# Patient Record
Sex: Male | Born: 1935 | Race: White | Hispanic: No | Marital: Married | Smoking: Never smoker
Health system: Southern US, Community
[De-identification: ages and names within clinical notes are randomized; demographics above are authoritative.]

## PROBLEM LIST (undated history)

## (undated) DIAGNOSIS — Z860100 Personal history of colon polyps, unspecified: Secondary | ICD-10-CM

## (undated) DIAGNOSIS — I10 Essential (primary) hypertension: Secondary | ICD-10-CM

## (undated) DIAGNOSIS — R0982 Postnasal drip: Secondary | ICD-10-CM

## (undated) DIAGNOSIS — C9310 Chronic myelomonocytic leukemia not having achieved remission: Secondary | ICD-10-CM

## (undated) DIAGNOSIS — D72821 Monocytosis (symptomatic): Secondary | ICD-10-CM

## (undated) DIAGNOSIS — E785 Hyperlipidemia, unspecified: Secondary | ICD-10-CM

## (undated) DIAGNOSIS — E7800 Pure hypercholesterolemia, unspecified: Secondary | ICD-10-CM

## (undated) DIAGNOSIS — C9000 Multiple myeloma not having achieved remission: Secondary | ICD-10-CM

## (undated) HISTORY — PX: EAR BIOPSY: SHX1480

## (undated) HISTORY — PX: HERNIA REPAIR: SHX51

## (undated) HISTORY — DX: Chronic myelomonocytic leukemia not having achieved remission: C93.10

## (undated) HISTORY — DX: Postnasal drip: R09.82

## (undated) HISTORY — DX: Personal history of colonic polyps: Z86.010

## (undated) HISTORY — PX: NOSE SURGERY: SHX723

## (undated) HISTORY — DX: Personal history of colon polyps, unspecified: Z86.0100

## (undated) HISTORY — DX: Multiple myeloma not having achieved remission: C90.00

## (undated) HISTORY — DX: Pure hypercholesterolemia, unspecified: E78.00

## (undated) HISTORY — DX: Hyperlipidemia, unspecified: E78.5

## (undated) HISTORY — DX: Essential (primary) hypertension: I10

## (undated) HISTORY — PX: TONSILLECTOMY: SUR1361

## (undated) HISTORY — DX: Monocytosis (symptomatic): D72.821

---

## 2011-03-09 DIAGNOSIS — H269 Unspecified cataract: Secondary | ICD-10-CM | POA: Insufficient documentation

## 2012-02-19 ENCOUNTER — Emergency Department: Payer: Self-pay | Admitting: Emergency Medicine

## 2012-02-19 LAB — CBC
HCT: 44.2 % (ref 40.0–52.0)
HGB: 14.9 g/dL (ref 13.0–18.0)
MCH: 31.5 pg (ref 26.0–34.0)
Platelet: 135 10*3/uL — ABNORMAL LOW (ref 150–440)

## 2012-02-19 LAB — BASIC METABOLIC PANEL
BUN: 22 mg/dL — ABNORMAL HIGH (ref 7–18)
Calcium, Total: 8.5 mg/dL (ref 8.5–10.1)
Chloride: 108 mmol/L — ABNORMAL HIGH (ref 98–107)
Creatinine: 1.07 mg/dL (ref 0.60–1.30)
Osmolality: 292 (ref 275–301)
Sodium: 145 mmol/L (ref 136–145)

## 2012-02-19 IMAGING — CT CT CHEST W/ CM
1 of 2 series · 14 of 32 positions shown, 18 images · non-contrast
Comparison: none

REASON FOR EXAM: cough and hypoxia
COMMENTS:

[Series 5: lung windows · axial · 0.73mm/px · z∈[-409,-139]mm · 14 of 108 slices shown, 18 images]
[im 9/108  mediastinal]
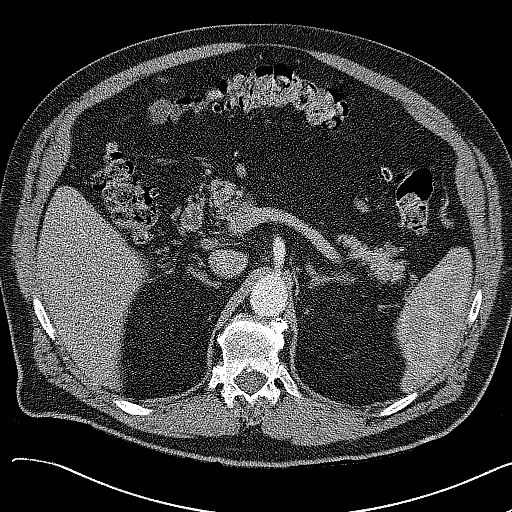
[im 9/108  lung]
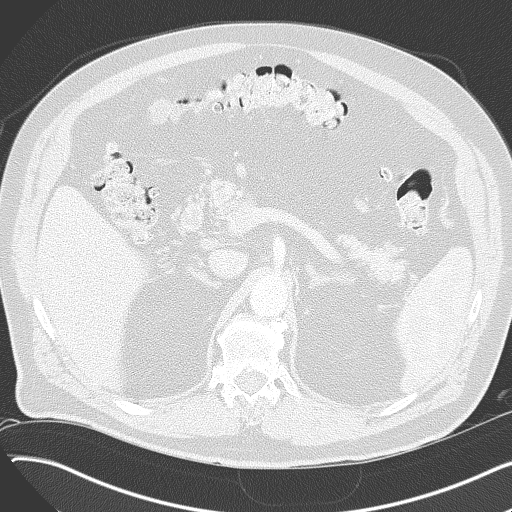
[im 17/108  lung]
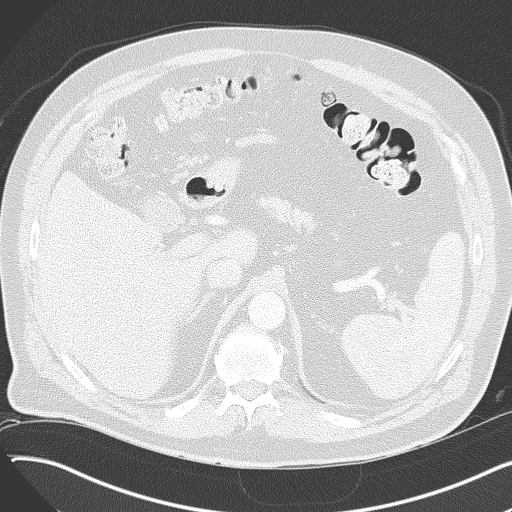
[im 25/108  lung]
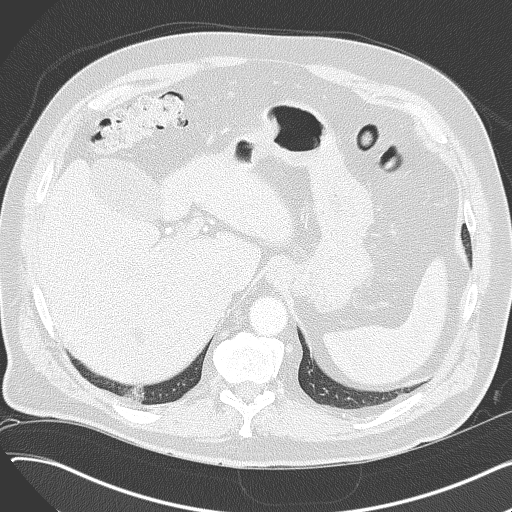
[im 33/108  lung]
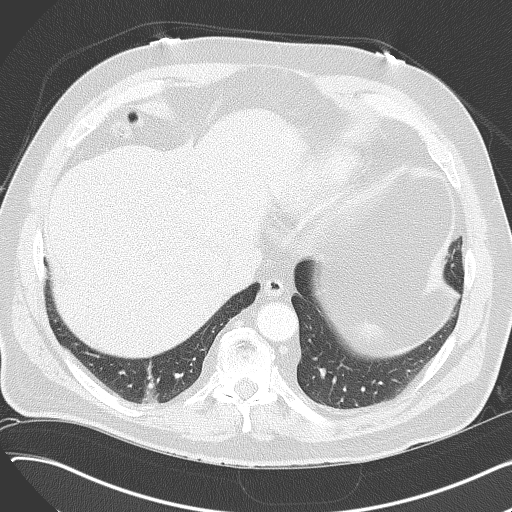
[im 42/108  mediastinal]
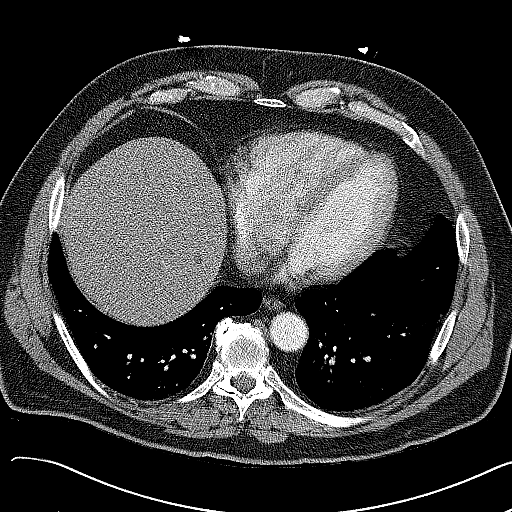
[im 42/108  lung]
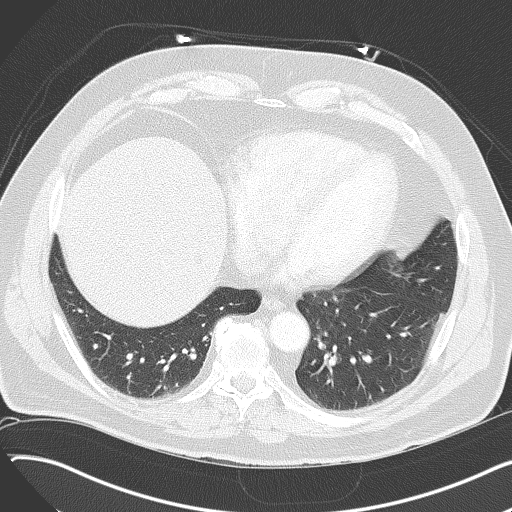
[im 50/108  lung]
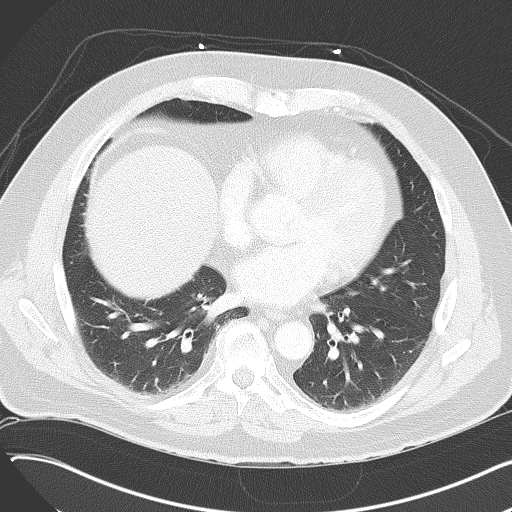
[im 51/108  lung]
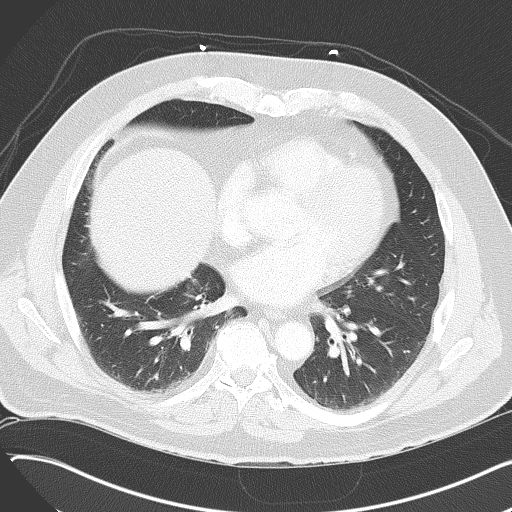
[im 54/108  lung]
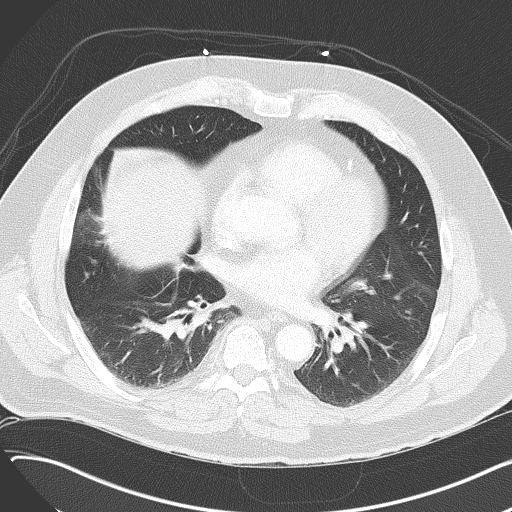
[im 58/108  mediastinal]
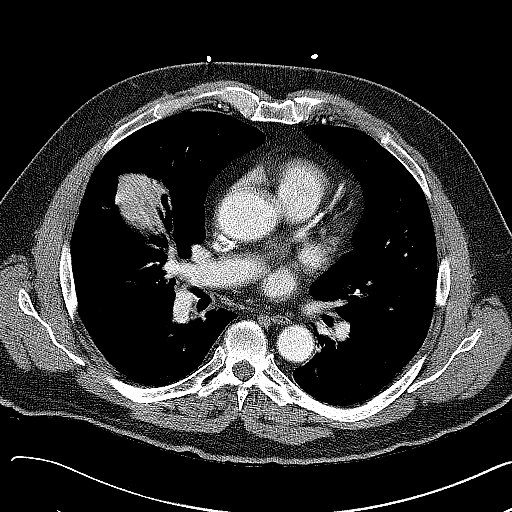
[im 58/108  lung]
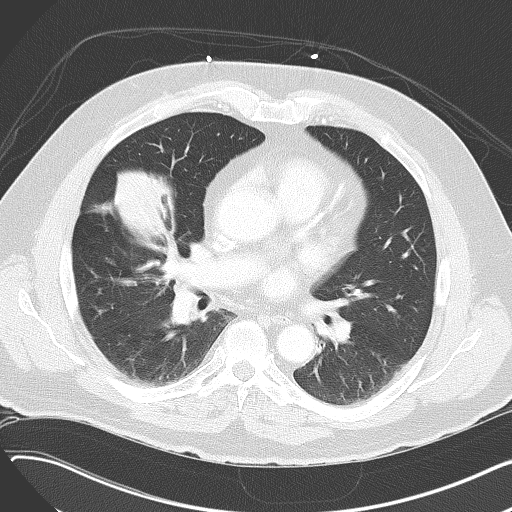
[im 66/108  lung]
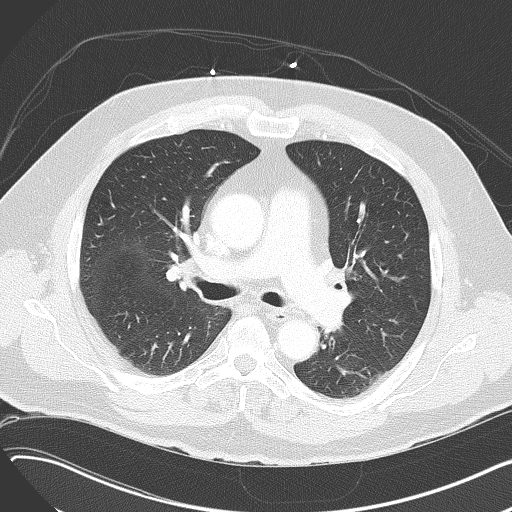
[im 75/108  lung]
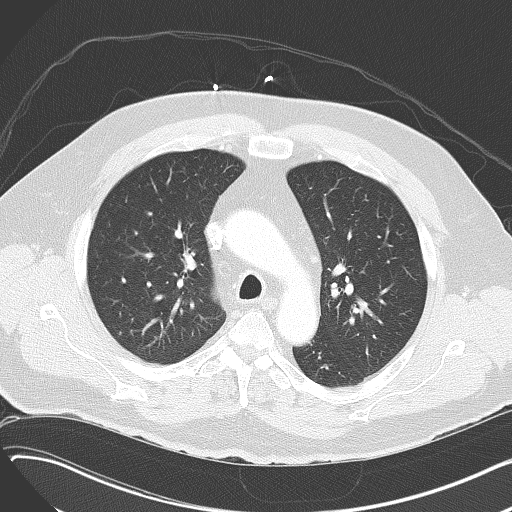
[im 83/108  lung]
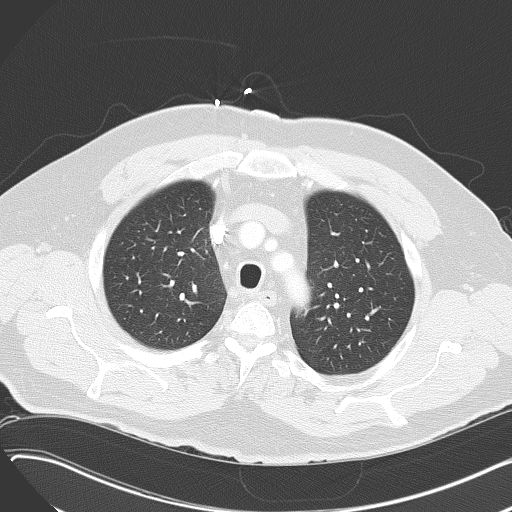
[im 91/108  mediastinal]
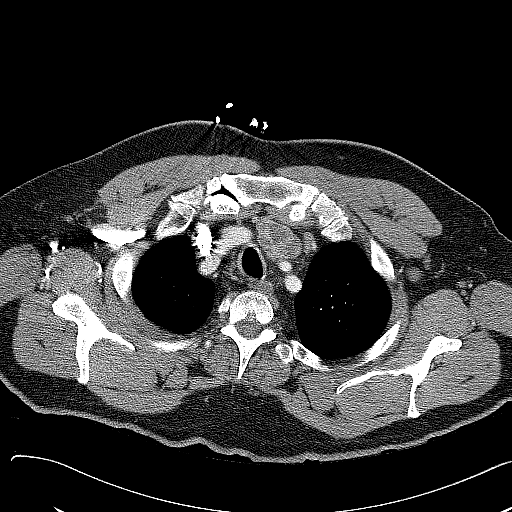
[im 91/108  lung]
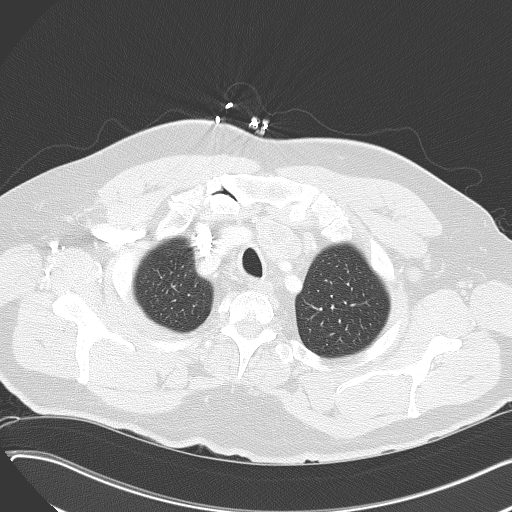
[im 99/108  lung]
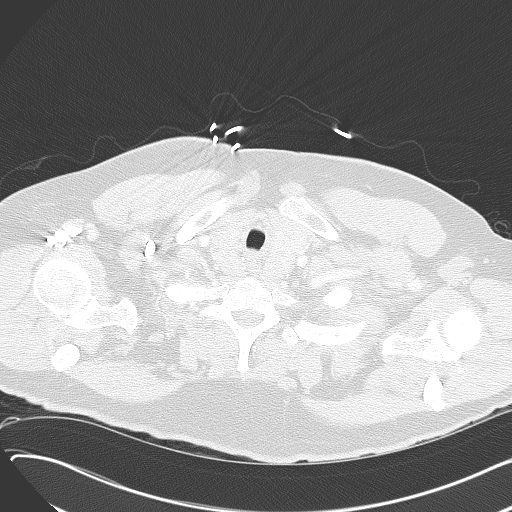

[14 of 32 positions shown; findings below may reference images not displayed]

PROCEDURE:     CT  - CT CHEST (FOR PE) W  - [DATE]  [DATE]

RESULT:     Axial CT scanning was performed through the chest with
reconstructions at 3 mm intervals and slice thicknesses following
intravenous administration of 75 cc of [0K]. Review of multiplanar
reconstructed images was performed separately on the VIA monitor.

The cardiac chambers are normal in size. The caliber of the thoracic aorta
is normal. Contrast within the pulmonary arterial tree is normal. There are
no findings to suggest an acute pulmonary embolism. There is enlargement of
the thyroid lobes with the left lobe being larger than the right. The
thyroid is multinodular in appearance. There is no pathologic mediastinal or
hilar lymphadenopathy.

At lung window settings there is basilar atelectasis on the right just above
the dome of the mildly elevated hemidiaphragm. I do not see evidence of
classic alveolar infiltrates. No suspicious pulmonary parenchymal masses are
demonstrated. There is no pleural effusion.

Within the upper abdomen there are hypodensities in the liver which measure
1 cm in diameter or less most compatible with cysts. The largest seen on
image 83 in the right lobe has Hounsfield measurement of +14. The smaller in
the left aspect of the dome has Hounsfield measurement of +3. I see no
intrahepatic ductal dilation. The gallbladder exhibits no evidence of
calcified stones. The spleen is not enlarged. There are no adrenal masses.
The stomach is nondistended.

The thoracic vertebral bodies are preserved in height.
IMPRESSION: 1. There is mild elevation of the right hemidiaphragm. There is basilar
atelectasis on the right adjacent to the dome of the hemidiaphragm.
2. I do not see abnormality of the lung parenchyma elsewhere.
3. There enlargement of the thyroid gland consistent with a goiter.
4. I see no evidence of acute pulmonary embolism nor acute thoracic aortic
pathology. There is no evidence of CHF.
5. I see no mediastinal nor hilar lymphadenopathy.

## 2012-02-19 IMAGING — CR DG CHEST 2V
1 series · 2 of 2 positions shown · non-contrast
Comparison: none

REASON FOR EXAM: cough and fever
COMMENTS:

[Series 1: w chest pa · 0.14mm/px · 2 of 2 slices shown]
[im 1/2]
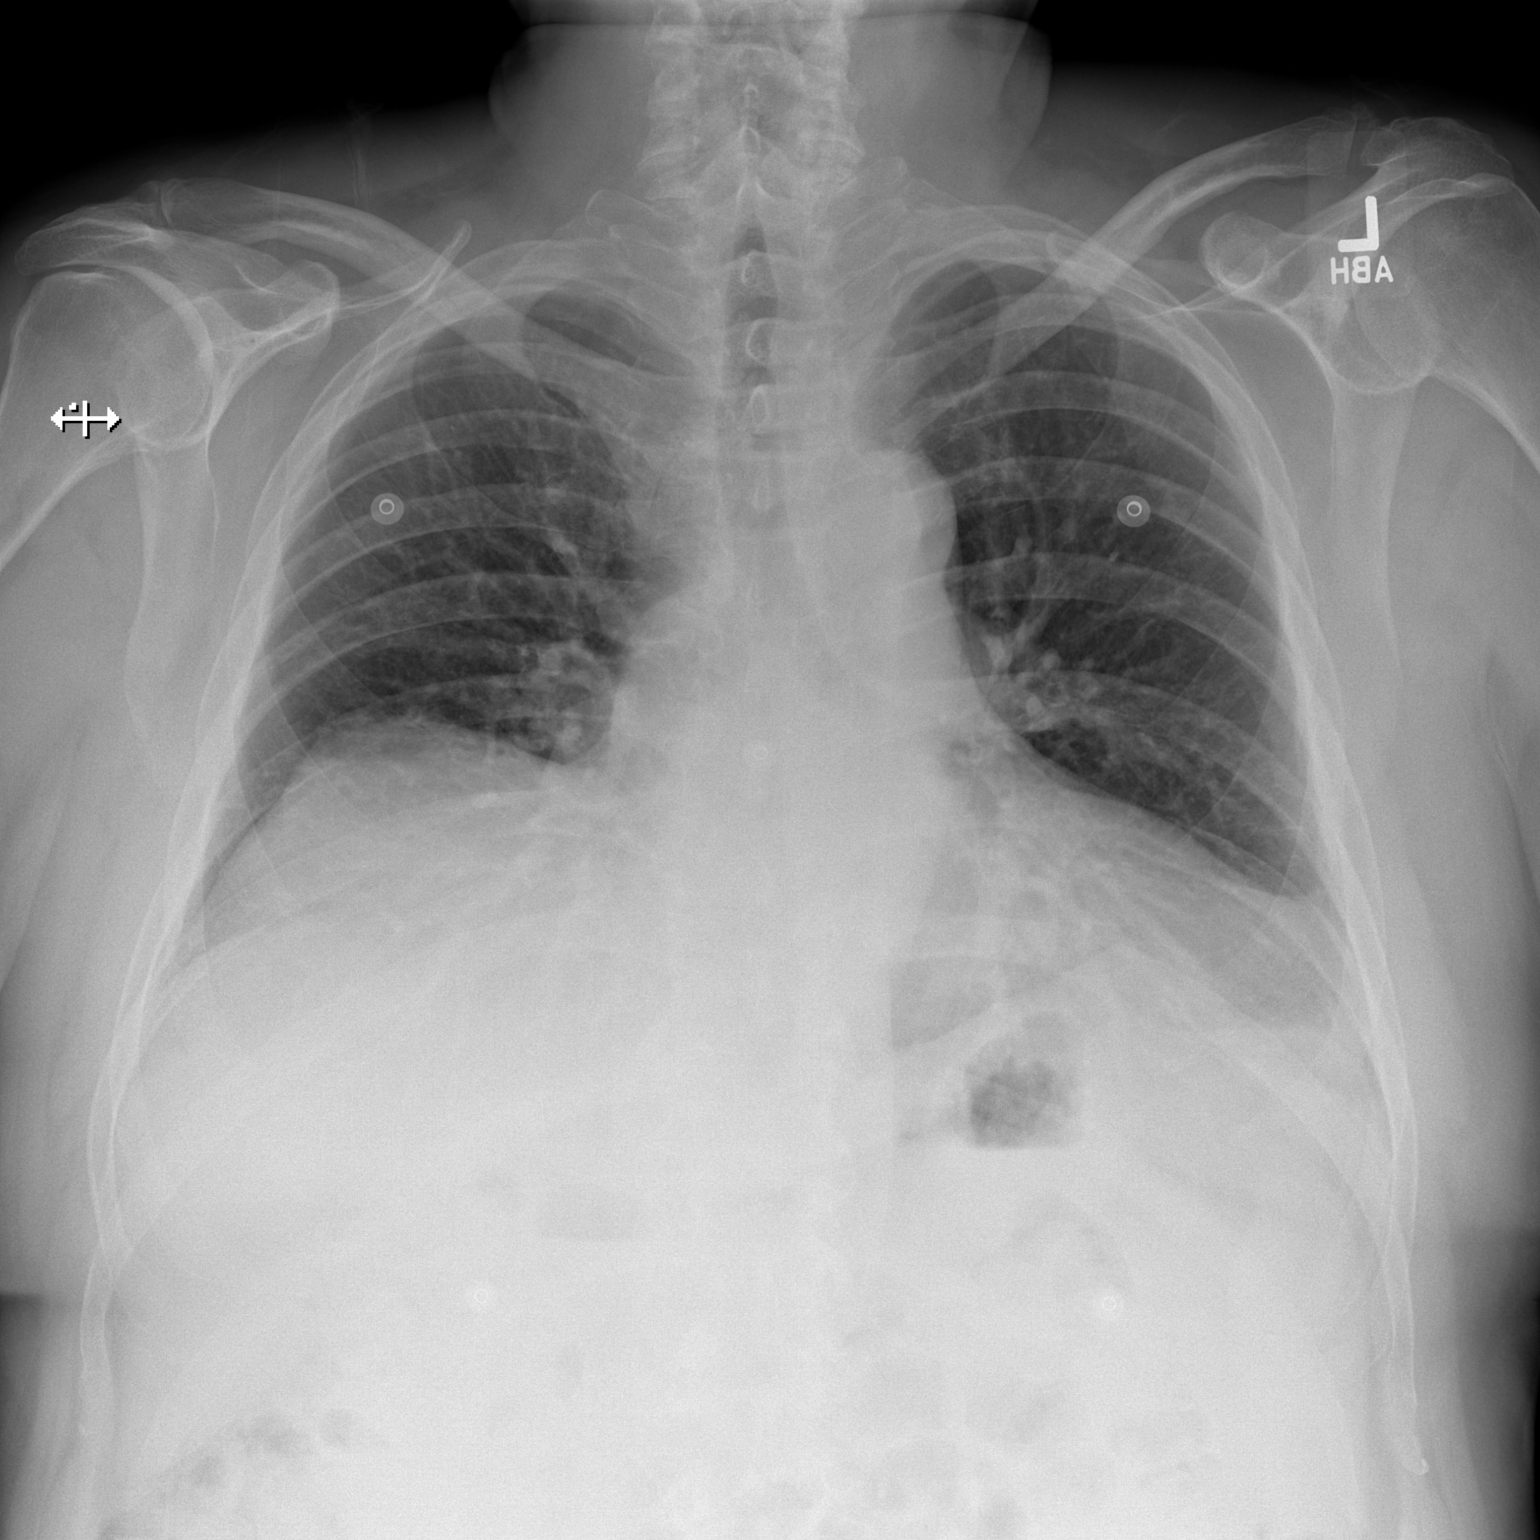
[im 2/2]
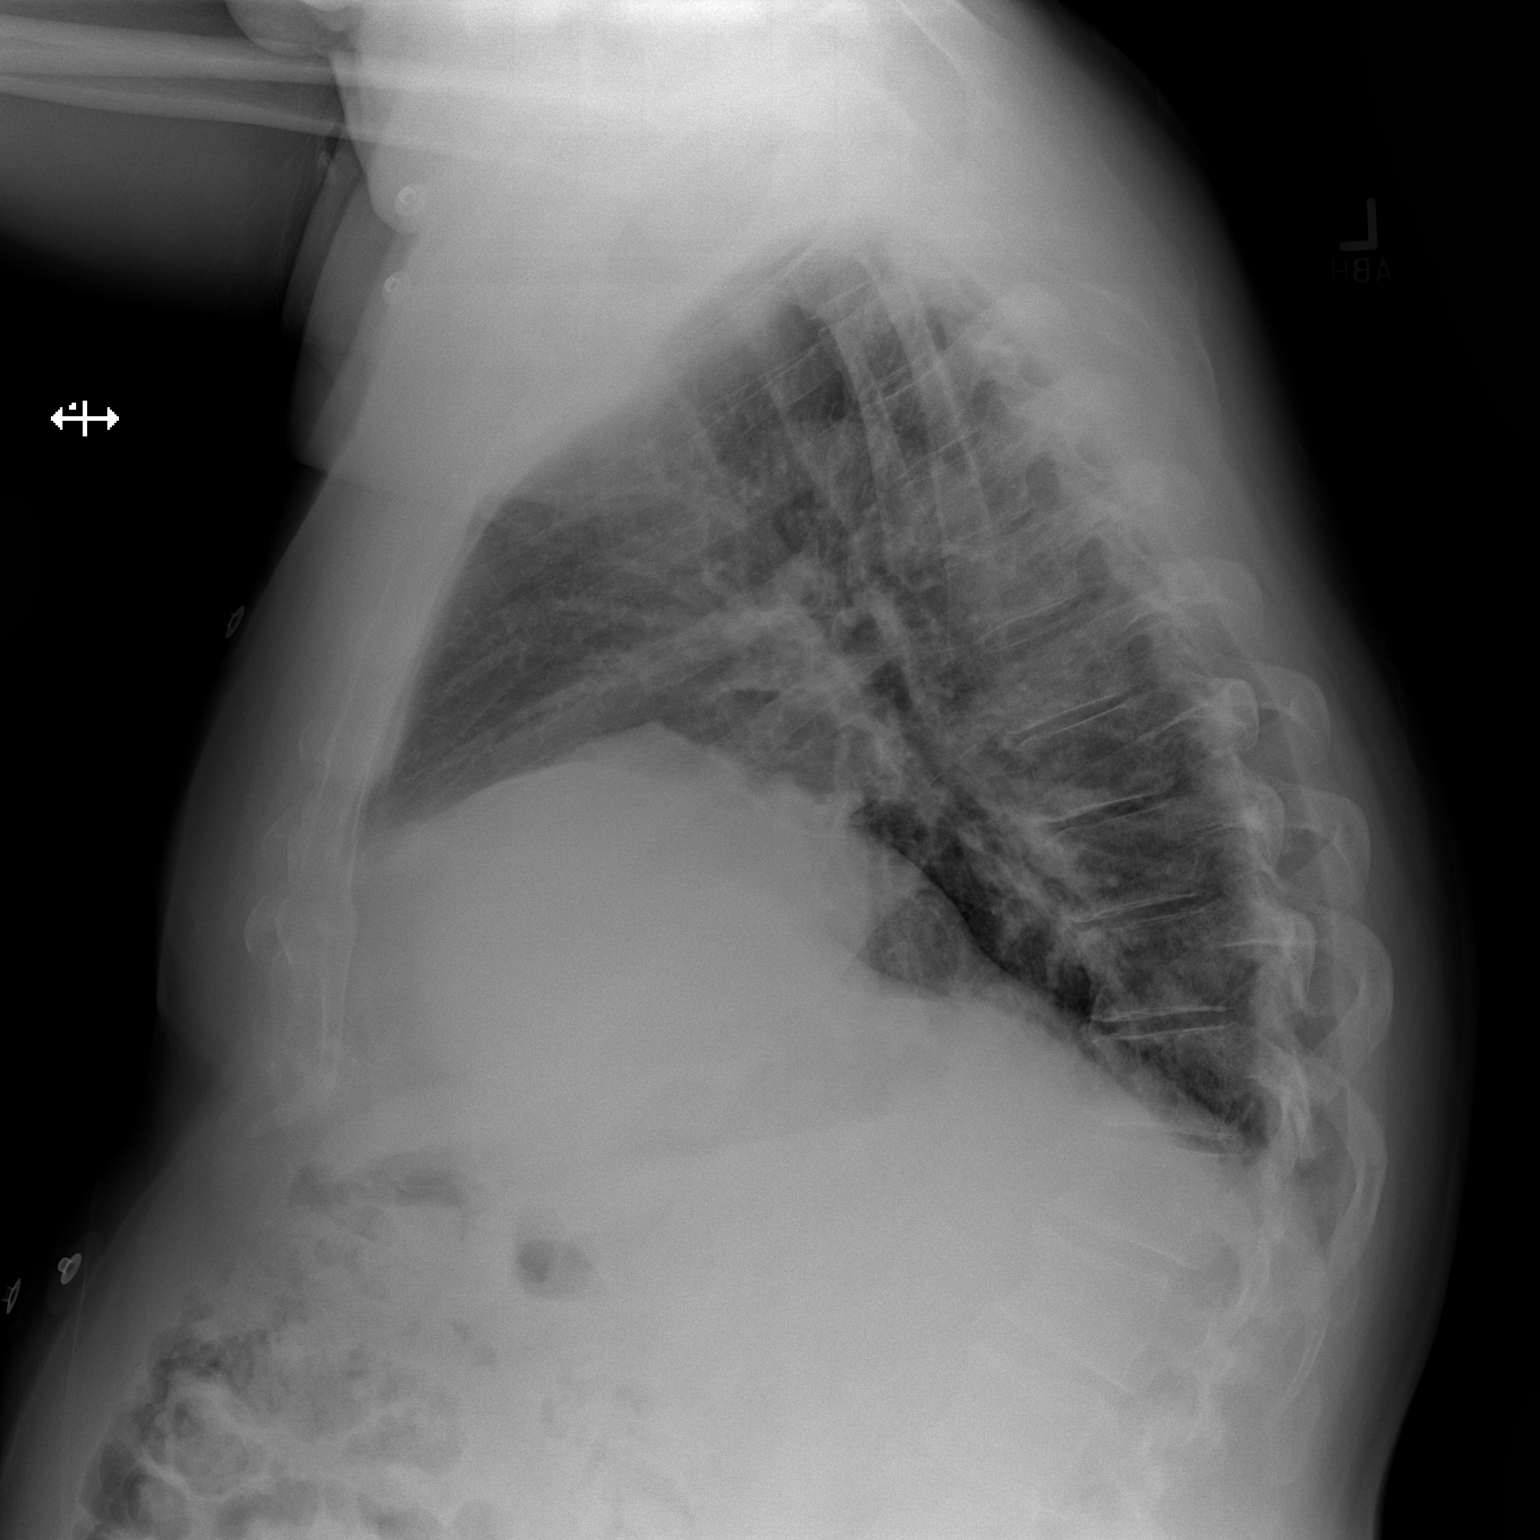

[2 of 2 positions shown; findings below may reference images not displayed]

PROCEDURE:     DXR - DXR CHEST PA (OR AP) AND LATERAL  - [DATE] [DATE]

RESULT:     There are no previous studies for comparison.

The hemidiaphragm on the right appears elevated. On the left the lung is
better inflated. There are increased interstitial densities in both lungs
which may be in part due to vascular crowding secondary to hypoinflation. I
see no alveolar infiltrate nor pleural effusion. The cardiac silhouette is
enlarged.
IMPRESSION: The study is limited due to hypoinflation. I cannot exclude
perihilar subsegmental atelectasis or mild perihilar interstitial edema. A
followup PA and lateral chest x-ray with deep inspiration would be useful.

## 2013-03-11 DIAGNOSIS — I10 Essential (primary) hypertension: Secondary | ICD-10-CM | POA: Insufficient documentation

## 2013-03-11 DIAGNOSIS — N529 Male erectile dysfunction, unspecified: Secondary | ICD-10-CM | POA: Insufficient documentation

## 2014-01-20 ENCOUNTER — Encounter: Payer: Self-pay | Admitting: Internal Medicine

## 2014-01-20 ENCOUNTER — Ambulatory Visit (INDEPENDENT_AMBULATORY_CARE_PROVIDER_SITE_OTHER): Payer: Medicare Other | Admitting: Internal Medicine

## 2014-01-20 VITALS — BP 126/78 | HR 71 | Temp 97.7°F | Ht 67.5 in | Wt 213.5 lb

## 2014-01-20 DIAGNOSIS — R0982 Postnasal drip: Secondary | ICD-10-CM

## 2014-01-20 DIAGNOSIS — R059 Cough, unspecified: Secondary | ICD-10-CM

## 2014-01-20 DIAGNOSIS — R05 Cough: Secondary | ICD-10-CM

## 2014-01-20 MED ORDER — HYDROCODONE-HOMATROPINE 5-1.5 MG/5ML PO SYRP: 5.0000 mL | ORAL_SOLUTION | Freq: Three times a day (TID) | ORAL | Status: DC | PRN

## 2014-01-20 NOTE — Patient Instructions (Addendum)
Cough, Adult  A cough is a reflex. It helps you clear your throat and airways. A cough can help heal your body. A cough can last 2 or 3 weeks (acute) or may last more than 8 weeks (chronic). Some common causes of a cough can include an infection, allergy, or a cold. HOME CARE  Only take medicine as told by your doctor.  If given, take your medicines (antibiotics) as told. Finish them even if you start to feel better.  Use a cold steam vaporizer or humidier in your home. This can help loosen thick spit (secretions).  Sleep so you are almost sitting up (semi-upright). Use pillows to do this. This helps reduce coughing.  Rest as needed.  Stop smoking if you smoke. GET HELP RIGHT AWAY IF:  You have yellowish-white fluid (pus) in your thick spit.  Your cough gets worse.  Your medicine does not reduce coughing, and you are losing sleep.  You cough up blood.  You have trouble breathing.  Your pain gets worse and medicine does not help.  You have a fever. MAKE SURE YOU:   Understand these instructions.  Will watch your condition.  Will get help right away if you are not doing well or get worse. Document Released: 07/28/2011 Document Revised: 02/06/2012 Document Reviewed: 07/28/2011 Exit[employer] Patient Information 2014 [employer].  

## 2014-01-20 NOTE — Progress Notes (Signed)
Pre visit review using our clinic review tool, if applicable. No additional management support is needed unless otherwise documented below in the visit note. 

## 2014-01-20 NOTE — Progress Notes (Signed)
HPI  Pt presents to the clinic today with c/o cough. This started 2 weeks ago. The cough is dry and unproductive. It seems to be worse and night. He did have a common cold a few weeks prior to this. He denies fever, chills, body aches. He has taken Dayquil and Nyquil OTC without much relief. He has no history of allergies or breathing problems. He does not smoke.  Review of Systems      Past Medical History  Diagnosis Date  . Hypertension   . History of colon polyps   . Hyperlipidemia     Family History  Problem Relation Age of Onset  . Hypertension Mother   . Dementia Father   . Diabetes Daughter     History   Social History  . Marital Status: Married    Spouse Name: N/A    Number of Children: N/A  . Years of Education: N/A   Occupational History  . Not on file.   Social History Main Topics  . Smoking status: Never Smoker   . Smokeless tobacco: Never Used  . Alcohol Use: Yes     Comment: rare  . Drug Use: No  . Sexual Activity: Not on file   Other Topics Concern  . Not on file   Social History Narrative  . No narrative on file    No Known Allergies   Constitutional:  Denies headache, fatigue, fever or abrupt weight changes.  HEENT:  Denies eye redness, eye pain, pressure behind the eyes, facial pain, nasal congestion, ear pain, ringing in the ears, wax buildup, runny nose or bloody nose. Respiratory: Positive cough. Denies difficulty breathing or shortness of breath.  Cardiovascular: Denies chest pain, chest tightness, palpitations or swelling in the hands or feet.   No other specific complaints in a complete review of systems (except as listed in HPI above).  Objective:   BP 126/78  Pulse 71  Temp(Src) 97.7 F (36.5 C) (Oral)  Ht 5' 7.5" (1.715 m)  Wt 213 lb 8 oz (96.843 kg)  BMI 32.93 kg/m2 Wt Readings from Last 3 Encounters:  01/20/14 213 lb 8 oz (96.843 kg)     General: Appears his stated age, well developed, well nourished in NAD. HEENT:  Head: normal shape and size; Eyes: sclera white, no icterus, conjunctiva pink, PERRLA and EOMs intact; Ears: Tm's gray and intact, normal light reflex; Nose: mucosa pink and moist, septum midline; Throat/Mouth: + PND. Teeth present, mucosa erythematous and moist, no exudate noted, no lesions or ulcerations noted.  Neck: Neck supple, trachea midline. No massses, lumps or thyromegaly present.  Cardiovascular: Normal rate and rhythm. S1,S2 noted.  No murmur, rubs or gallops noted. No JVD or BLE edema. No carotid bruits noted. Pulmonary/Chest: Normal effort and positive vesicular breath sounds. No respiratory distress. No wheezes, rales or ronchi noted.      Assessment & Plan:   Cough:  No indication for abx at this time Try an OTC antihistamine such as allegra or zyrtec daily x 2 weeks eRx for Hycodan   RTC as needed or if symptoms persist or worsen or if you start running fevers or coughing up yellow mucous.

## 2014-07-22 ENCOUNTER — Ambulatory Visit: Payer: Self-pay | Admitting: Family Medicine

## 2014-07-22 DIAGNOSIS — Z0289 Encounter for other administrative examinations: Secondary | ICD-10-CM

## 2015-01-30 ENCOUNTER — Ambulatory Visit: Payer: Self-pay | Admitting: Family Medicine

## 2015-01-30 DIAGNOSIS — I82409 Acute embolism and thrombosis of unspecified deep veins of unspecified lower extremity: Secondary | ICD-10-CM | POA: Insufficient documentation

## 2015-01-30 IMAGING — US US EXTREM LOW VENOUS*R*
1 series · 13 of 24 positions shown · non-contrast
Comparison: None.

CLINICAL DATA: 78-year-old with swollen right calf and leg.
Evaluate for DVT.

EXAM:
RIGHT LOWER EXTREMITY VENOUS DOPPLER ULTRASOUND
TECHNIQUE: Gray-scale sonography with graded compression, as well as color
Doppler and duplex ultrasound, were performed to evaluate the deep
venous system from the level of the common femoral vein through the
popliteal and proximal calf veins. Spectral Doppler was utilized to
evaluate flow at rest and with distal augmentation maneuvers.

[Series 1: us extrem low venous*right* · 0.10mm/px · 13 of 32 slices shown]
[im 1/32]
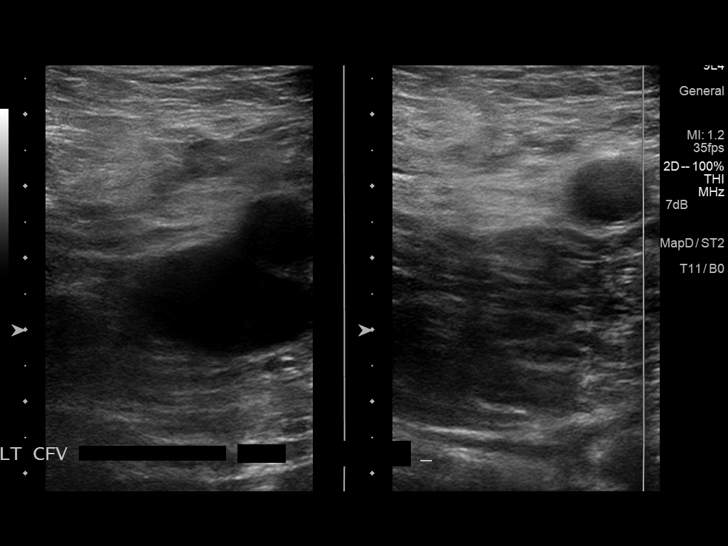
[im 3/32]
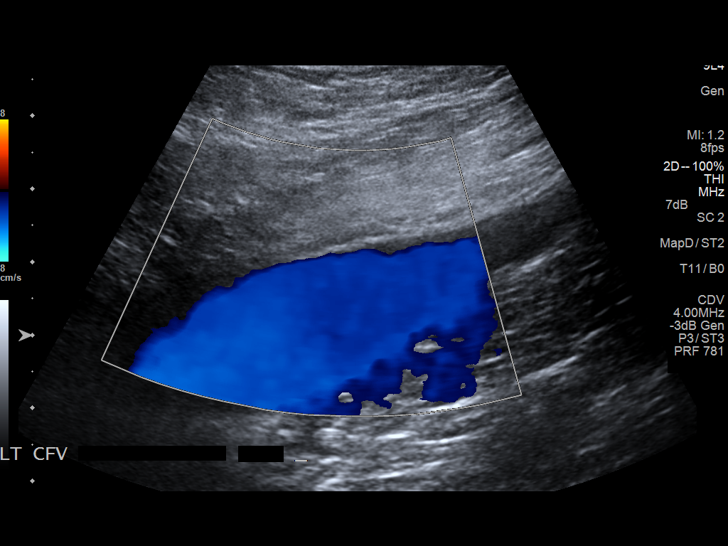
[im 6/32]
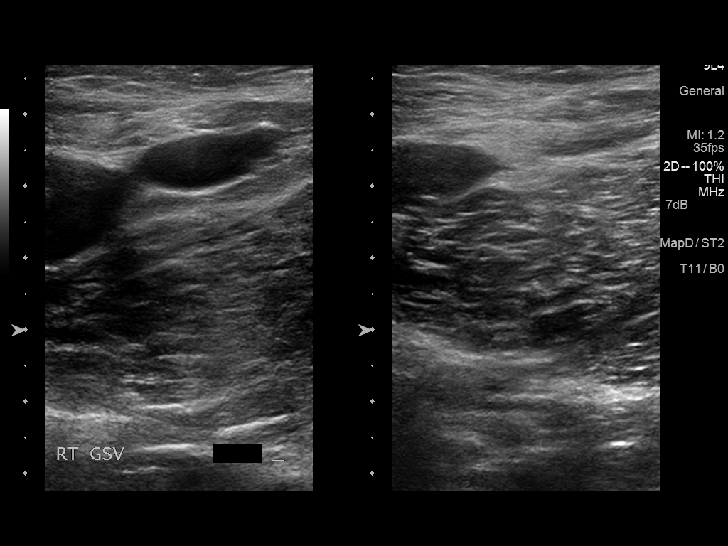
[im 9/32]
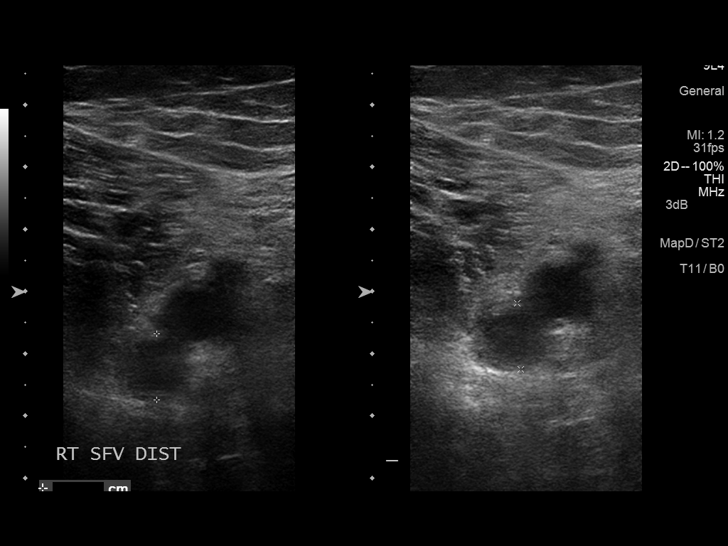
[im 11/32]
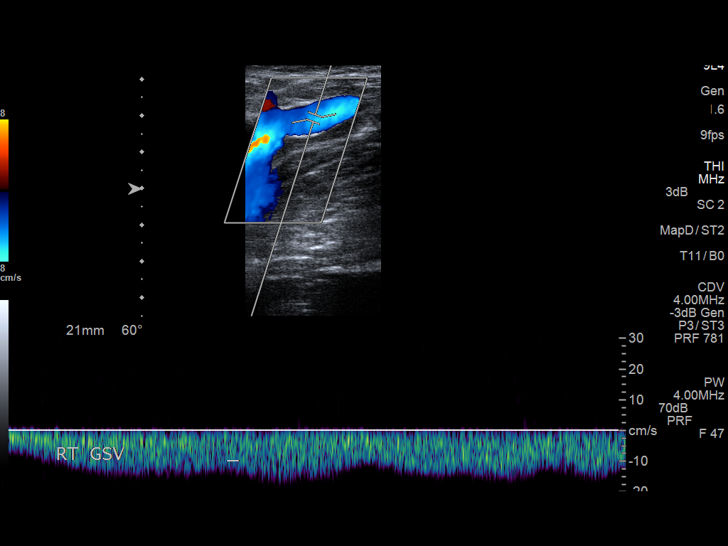
[im 14/32]
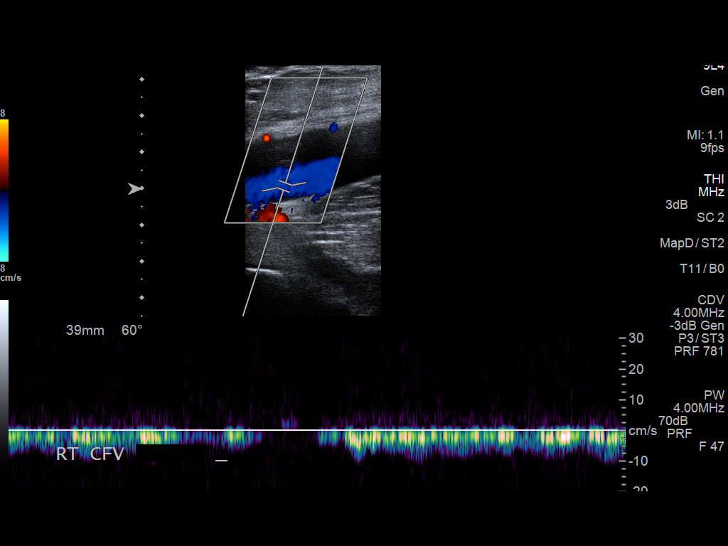
[im 17/32]
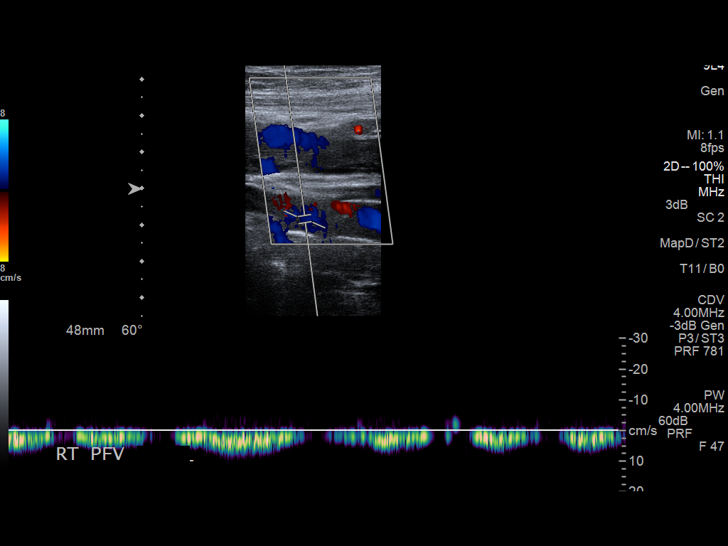
[im 18/32]
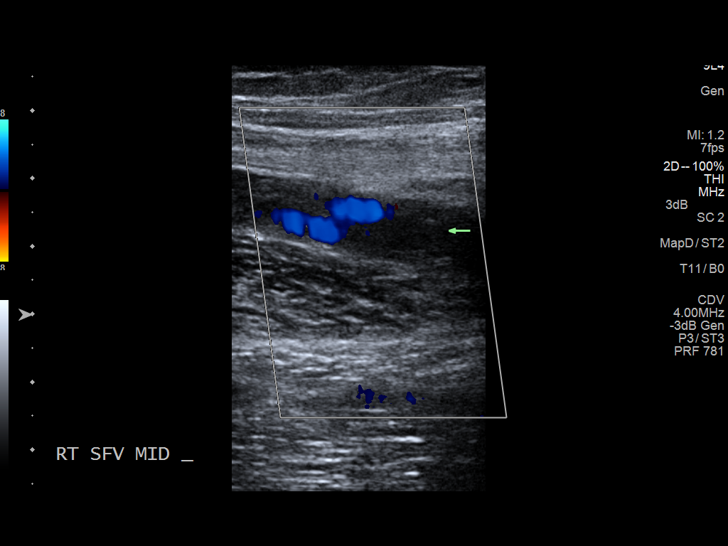
[im 21/32]
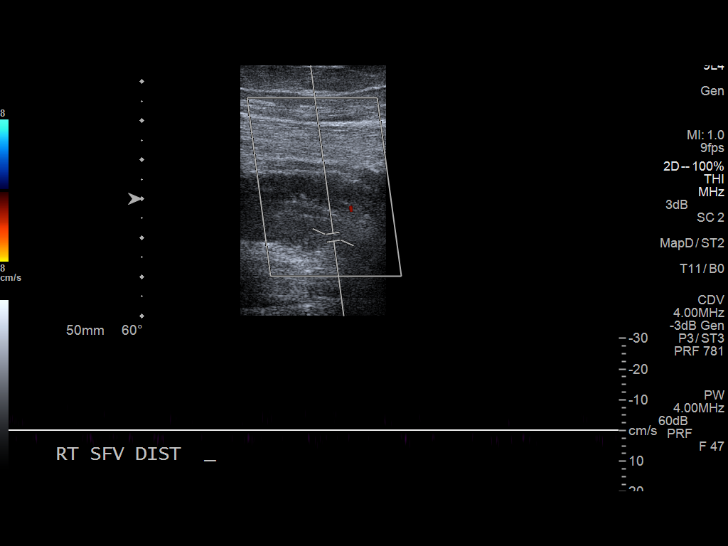
[im 23/32]
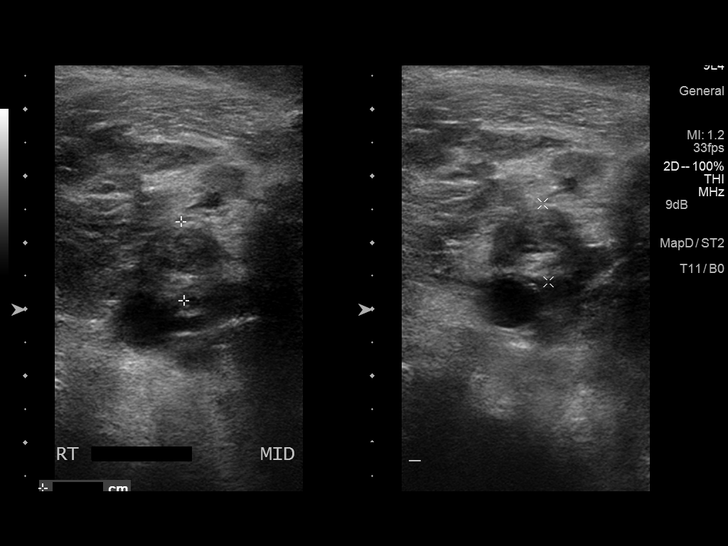
[im 26/32]
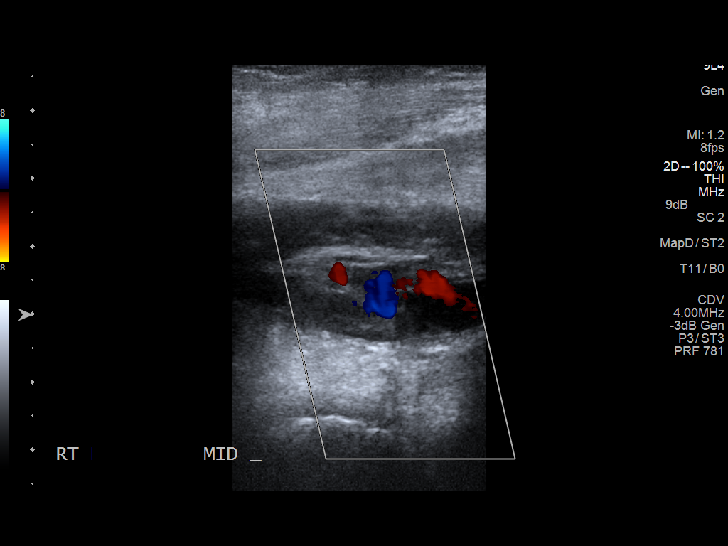
[im 29/32]
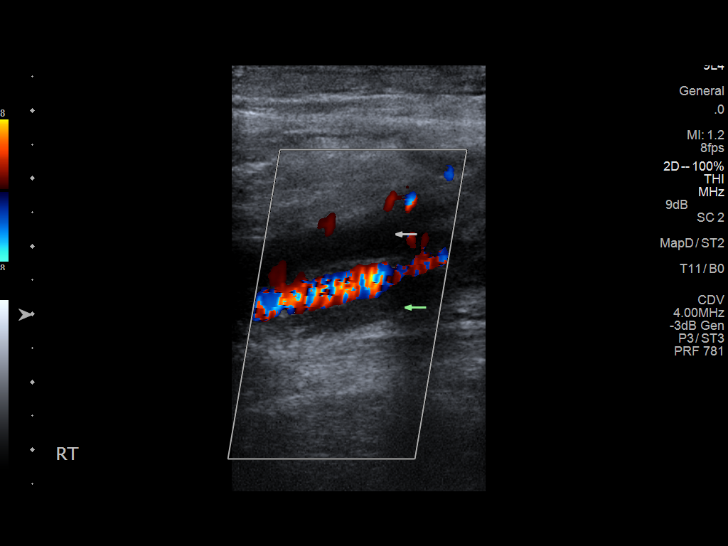
[im 32/32]
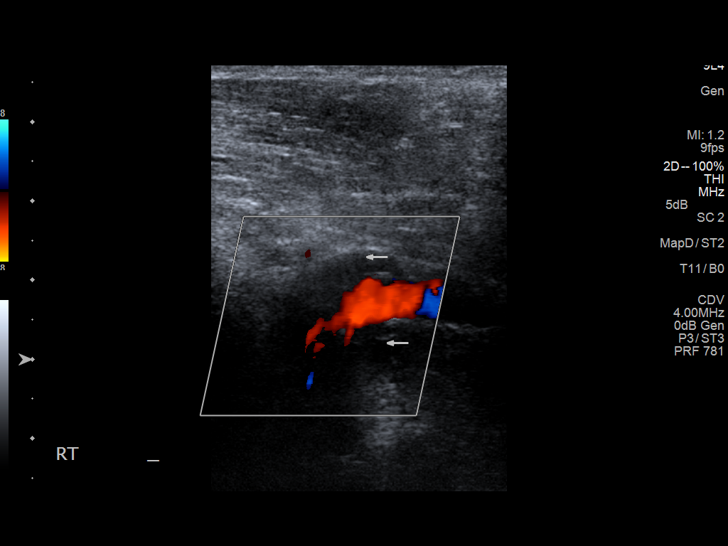

[13 of 24 positions shown; findings below may reference images not displayed]

FINDINGS: Normal compressibility and color Doppler flow in the left common
femoral vein.

There is echogenic thrombus in the right common femoral vein with
partial compressibility. The thrombus in the right common femoral
vein is nonocclusive. There is echogenic thrombus in the right
femoral vein. The thrombus is nonocclusive in the proximal and mid
femoral vein. Thrombus appears to be occlusive in the distal femoral
vein. There is echogenic thrombus in the popliteal vein which is non
compressible. The popliteal thrombus is occlusive. There is thrombus
in the posterior tibial veins, peroneal veins and anterior tibial
veins. There appears to be thrombus in the visualized gastrocnemius
veins. Proximal great saphenous vein appears to be patent.
IMPRESSION: Positive for deep vein thrombosis throughout the right lower
extremity. The thrombus extends from the right calf veins to the
right common femoral vein.

Left common femoral vein is patent.

## 2015-10-21 DIAGNOSIS — E049 Nontoxic goiter, unspecified: Secondary | ICD-10-CM | POA: Insufficient documentation

## 2016-11-28 HISTORY — PX: COLONOSCOPY: SHX174

## 2017-10-02 DIAGNOSIS — E782 Mixed hyperlipidemia: Secondary | ICD-10-CM | POA: Insufficient documentation

## 2019-03-13 ENCOUNTER — Ambulatory Visit: Payer: PRIVATE HEALTH INSURANCE | Admitting: Internal Medicine

## 2019-12-19 ENCOUNTER — Ambulatory Visit: Payer: Self-pay | Admitting: Nurse Practitioner

## 2019-12-26 ENCOUNTER — Ambulatory Visit: Payer: Medicare Other | Admitting: Nurse Practitioner

## 2019-12-26 ENCOUNTER — Encounter: Payer: Self-pay | Admitting: Nurse Practitioner

## 2019-12-26 ENCOUNTER — Other Ambulatory Visit: Payer: Self-pay

## 2019-12-26 VITALS — BP 120/70 | HR 62 | Temp 98.7°F | Resp 16 | Ht 67.5 in | Wt 212.0 lb

## 2019-12-26 DIAGNOSIS — E782 Mixed hyperlipidemia: Secondary | ICD-10-CM

## 2019-12-26 DIAGNOSIS — G8929 Other chronic pain: Secondary | ICD-10-CM

## 2019-12-26 DIAGNOSIS — M545 Low back pain, unspecified: Secondary | ICD-10-CM

## 2019-12-26 DIAGNOSIS — E039 Hypothyroidism, unspecified: Secondary | ICD-10-CM

## 2019-12-26 DIAGNOSIS — I10 Essential (primary) hypertension: Secondary | ICD-10-CM | POA: Diagnosis not present

## 2019-12-26 DIAGNOSIS — I824Y1 Acute embolism and thrombosis of unspecified deep veins of right proximal lower extremity: Secondary | ICD-10-CM | POA: Diagnosis not present

## 2019-12-26 DIAGNOSIS — R0982 Postnasal drip: Secondary | ICD-10-CM

## 2019-12-26 DIAGNOSIS — H269 Unspecified cataract: Secondary | ICD-10-CM

## 2019-12-26 DIAGNOSIS — N529 Male erectile dysfunction, unspecified: Secondary | ICD-10-CM

## 2019-12-26 MED ORDER — ENALAPRIL MALEATE 20 MG PO TABS: 20.0000 mg | ORAL_TABLET | ORAL | 3 refills | Status: DC

## 2019-12-26 MED ORDER — FLUTICASONE PROPIONATE 50 MCG/ACT NA SUSP: 1.0000 | Freq: Every day | NASAL | 3 refills | Status: AC | PRN

## 2019-12-26 MED ORDER — RIVAROXABAN 20 MG PO TABS: 20.0000 mg | ORAL_TABLET | Freq: Every day | ORAL | 1 refills | Status: DC

## 2019-12-26 MED ORDER — TRIAMTERENE-HCTZ 37.5-25 MG PO CAPS: 1.0000 | ORAL_CAPSULE | Freq: Every day | ORAL | 3 refills | Status: DC

## 2019-12-26 MED ORDER — SIMVASTATIN 20 MG PO TABS: 20.0000 mg | ORAL_TABLET | Freq: Every day | ORAL | 3 refills | Status: DC

## 2019-12-26 NOTE — Patient Instructions (Signed)
Please sign up for mychart so you can have access to your medical information, future appts and can send msg to staff if needed.  To come back to [address] for lab work on Monday morning between 7-7:15

## 2019-12-26 NOTE — Progress Notes (Signed)
Careteam: Patient Care Team: Venia Carbon, MD as PCP - General (Internal Medicine)  Advanced Directive information Does Patient Have a Medical Advance Directive?: Yes, Type of Advance Directive: [place];Living will, Does patient want to make changes to medical advance directive?: No - Guardian declined  No Known Allergies  Chief Complaint  Patient presents with  . Establish Care    New Patient      HPI: Patient is a 84 y.o. male seen in today at the [facility] to establish care. Unsure when last AWV was Reports he saw last PCP every 6 months-- here at [facility] half a year and in [place] twice a year.  His previous PCP retired and the [place] doctor went under a different payment method. Leaving for [place] at the end of April (then come back in July for a month) then back in October.   Nasal congestion- uses nasal spray as needed, flonase twice daily as needed Had his nose busted in the service, has a deviated sputum and had nose worked on.always has one side worse than other   htn- well controlled on enalapril, triamterene-hctz  Reports when he gets a cold it goes right to his chest and uses guaifenesin with codeine which is the only thing that seems to help last filled 12/2017  Hx of DVT- using xarelto 20 mg - has had blood clots twice- 3 places in the leg at one time.   Hyperlipidemia- continues on zocor-- reports generally fine when tested. (October)   ED- sidenafil 100 mg (5 of the 20 mg tablet).   Has toenail fungus and uses OTC topical.   Obesity- weight goes up and down based on activity.  Walks twice daily.   Hx of low back pain with sciatica- not currently having an issue.    Review of Systems:  Review of Systems  Constitutional: Negative for chills, fever and weight loss.  HENT: Positive for hearing loss. Negative for tinnitus.   Eyes:       Corrective lens   Respiratory: Negative for cough, sputum production and shortness  of breath.   Cardiovascular: Negative for chest pain, palpitations and leg swelling.  Gastrointestinal: Negative for abdominal pain, constipation, diarrhea and heartburn.  Genitourinary: Positive for frequency. Negative for dysuria and urgency.  Musculoskeletal: Positive for back pain (occasionally ). Negative for falls, joint pain and myalgias.  Skin: Negative.   Neurological: Negative for dizziness and headaches.  Psychiatric/Behavioral: Negative for depression and memory loss. The patient does not have insomnia.     Past Medical History:  Diagnosis Date  . History of colon polyps   . Hypercholesteremia    Per [facility] New Patient Packet.  . Hyperlipidemia   . Hypertension    Per [facility] New Patient Packet.  Marland Kitchen Post-nasal drip    Per [place] Patient Packet.   Past Surgical History:  Procedure Laterality Date  . COLONOSCOPY  11/28/2016   [facility], Per [facility] New Patient Packet.  Marland Kitchen HERNIA REPAIR     Per [facility] New Patient Packet.  Marland Kitchen NOSE SURGERY     [facility], Per [facility] New Patient Packet.  . TONSILLECTOMY     Per [facility] New Patient Packet.   Social History:   reports that he has never smoked. He has never used smokeless tobacco. He reports current alcohol use of about 1.0 standard drinks of alcohol per week. He reports that he does not use drugs.  Family History  Problem Relation Age  of Onset  . Hypertension Mother   . Dementia Father   . Diabetes Daughter     Medications: Patient's Medications  New Prescriptions   No medications on file  Previous Medications   ENALAPRIL (VASOTEC) 20 MG TABLET    Take 20 mg by mouth every morning.    FLUTICASONE (FLONASE) 50 MCG/ACT NASAL SPRAY    Place into both nostrils as needed for allergies or rhinitis.   GUAIFENESIN-CODEINE (ROBAFEN AC PO)    Take 5 mLs by mouth as needed. As needed for cough.   RIVAROXABAN (XARELTO) 20 MG TABS TABLET    Take 20 mg by mouth at bedtime.   SILDENAFIL (VIAGRA) 100 MG TABLET    Take 100 mg by mouth daily as needed for  erectile dysfunction.   SIMVASTATIN (ZOCOR) 20 MG TABLET    Take 20 mg by mouth at bedtime.   TRIAMTERENE-HYDROCHLOROTHIAZIDE (DYAZIDE) 37.5-25 MG PER CAPSULE    Take 1 capsule by mouth daily.   UNABLE TO FIND    Saline Nose Spray As Needed.   UNDECYLENIC AC-ZN UNDECYLENATE (FUNGI-NAIL TOE & FOOT EX)    Apply topically as needed. For Toe Nail Fungus.  Modified Medications   No medications on file  Discontinued Medications   No medications on file    Physical Exam:  Vitals:   12/26/19 0827  BP: 120/70  Pulse: 62  Resp: 16  Temp: 98.7 F (37.1 C)  SpO2: 98%  Weight: 212 lb (96.2 kg)  Height: 5' 7.5" (1.715 m)   Body mass index is 32.71 kg/m. Wt Readings from Last 3 Encounters:  12/26/19 212 lb (96.2 kg)  01/20/14 213 lb 8 oz (96.8 kg)    Physical Exam Constitutional:      General: He is not in acute distress.    Appearance: He is well-developed. He is not diaphoretic.  HENT:     Head: Normocephalic and atraumatic.     Right Ear: Tympanic membrane and ear canal normal.     Left Ear: Tympanic membrane and ear canal normal.  Eyes:     Conjunctiva/sclera: Conjunctivae normal.     Pupils: Pupils are equal, round, and reactive to light.  Cardiovascular:     Rate and Rhythm: Normal rate and regular rhythm.     Heart sounds: Normal heart sounds.  Pulmonary:     Effort: Pulmonary effort is normal.     Breath sounds: Normal breath sounds.  Abdominal:     General: Bowel sounds are normal.     Palpations: Abdomen is soft.  Musculoskeletal:        General: No tenderness.     Cervical back: Normal range of motion and neck supple.  Skin:    General: Skin is warm and dry.  Neurological:     Mental Status: He is alert and oriented to person, place, and time.  Psychiatric:        Mood and Affect: Mood normal.        Behavior: Behavior normal.     Labs reviewed: Basic Metabolic Panel: No results for input(s): NA, K, CL, CO2, GLUCOSE, BUN, CREATININE, CALCIUM, MG, PHOS,  TSH in the last 8760 hours. Liver Function Tests: No results for input(s): AST, ALT, ALKPHOS, BILITOT, PROT, ALBUMIN in the last 8760 hours. No results for input(s): LIPASE, AMYLASE in the last 8760 hours. No results for input(s): AMMONIA in the last 8760 hours. CBC: No results for input(s): WBC, NEUTROABS, HGB, HCT, MCV, PLT in the last 8760 hours. Lipid  Panel: No results for input(s): CHOL, HDL, LDLCALC, TRIG, CHOLHDL, LDLDIRECT in the last 8760 hours. TSH: No results for input(s): TSH in the last 8760 hours. A1C: No results found for: HGBA1C   Assessment/Plan 1. Deep vein thrombosis (DVT) of proximal vein of right lower extremity, unspecified chronicity ([place]) -has done well with xarelto with no signs of recurrence. No bleeding noted -will follow up CBC -moving fwd xarelto 10 mg would be appropriate for DVT prevention. Will discuss this further with him at follow up appt.  - rivaroxaban (XARELTO) 20 MG TABS tablet; Take 1 tablet (20 mg total) by mouth at bedtime.  Dispense: 90 tablet; Refill: 1  2. Acquired hypothyroidism -elevated TSH in the past but not requiring medication, will follow up TSH  3. Essential hypertension -stable on current regimen with dietary modification -will follow up lab - triamterene-hydrochlorothiazide (DYAZIDE) 37.5-25 MG capsule; Take 1 each (1 capsule total) by mouth daily.  Dispense: 90 capsule; Refill: 3 - enalapril (VASOTEC) 20 MG tablet; Take 1 tablet (20 mg total) by mouth every morning.  Dispense: 90 tablet; Refill: 3  4. Mixed hyperlipidemia -will follow up lipid panel -continues on zocor with dietary modifications. - simvastatin (ZOCOR) 20 MG tablet; Take 1 tablet (20 mg total) by mouth at bedtime.  Dispense: 90 tablet; Refill: 3  5. Erectile dysfunction, unspecified erectile dysfunction type Using Viagra 20 mg tablets taking 5 to equal 100 mg.  6. Cataract, unspecified cataract type, unspecified laterality -following with  ophthalmologist.  7. Post-nasal drip -- fluticasone (FLONASE) 50 MCG/ACT nasal spray; Place 1 spray into both nostrils daily as needed for allergies or rhinitis.  Dispense: 11.1 mL; Refill: 3  8. Chronic midline low back pain, unspecified whether sciatica present -stable at this time, encouraged core strength with weight loss to maintain good back health.  Next appt: 12/31/2019 for AWV and then 3 month follow up [place]. Harle Battiest  [facility] [phone]   Total time 60 min:  time greater than 50% of total time spent doing pt counseling and coordination of care.

## 2019-12-27 ENCOUNTER — Encounter: Payer: Self-pay | Admitting: Nurse Practitioner

## 2019-12-31 ENCOUNTER — Ambulatory Visit (INDEPENDENT_AMBULATORY_CARE_PROVIDER_SITE_OTHER): Payer: Medicare Other | Admitting: Nurse Practitioner

## 2019-12-31 ENCOUNTER — Other Ambulatory Visit: Payer: Self-pay

## 2019-12-31 ENCOUNTER — Encounter: Payer: Self-pay | Admitting: Nurse Practitioner

## 2019-12-31 DIAGNOSIS — Z0000 Encounter for general adult medical examination without abnormal findings: Secondary | ICD-10-CM | POA: Diagnosis not present

## 2019-12-31 LAB — COMPREHENSIVE METABOLIC PANEL
Albumin: 3.9 (ref 3.5–5.0)
Albumin: 3.9 (ref 3.5–5.0)
Calcium: 8.9 (ref 8.7–10.7)
Calcium: 8.9 (ref 8.7–10.7)
GFR calc Af Amer: 65
GFR calc Af Amer: 65
GFR calc non Af Amer: 56
GFR calc non Af Amer: 56
Globulin: 3.4
Globulin: 3.4

## 2019-12-31 LAB — HEPATIC FUNCTION PANEL
ALT: 11 (ref 10–40)
ALT: 11 (ref 10–40)
AST: 13 — AB (ref 14–40)
AST: 13 — AB (ref 14–40)
Alkaline Phosphatase: 65 (ref 25–125)
Alkaline Phosphatase: 65 (ref 25–125)
Bilirubin, Total: 0.6
Bilirubin, Total: 0.6

## 2019-12-31 LAB — LIPID PANEL
Cholesterol: 136 (ref 0–200)
Cholesterol: 136 (ref 0–200)
HDL: 32 — AB (ref 35–70)
HDL: 32 — AB (ref 35–70)
LDL Cholesterol: 84
LDL Cholesterol: 84
Triglycerides: 106 (ref 40–160)
Triglycerides: 106 (ref 40–160)

## 2019-12-31 LAB — BASIC METABOLIC PANEL
BUN: 28 — AB (ref 4–21)
BUN: 28 — AB (ref 4–21)
CO2: 26 — AB (ref 13–22)
CO2: 26 — AB (ref 13–22)
Chloride: 106 (ref 99–108)
Chloride: 106 (ref 99–108)
Creatinine: 1.2 (ref 0.6–1.3)
Creatinine: 1.2 (ref 0.6–1.3)
Glucose: 95
Potassium: 4 (ref 3.4–5.3)
Potassium: 4 (ref 3.4–5.3)
Sodium: 141 (ref 137–147)
Sodium: 141 (ref 137–147)

## 2019-12-31 LAB — CBC AND DIFFERENTIAL
HCT: 39 — AB (ref 41–53)
HCT: 39 — AB (ref 41–53)
Hemoglobin: 13.4 — AB (ref 13.5–17.5)
Hemoglobin: 13.4 — AB (ref 13.5–17.5)
Neutrophils Absolute: 3521
Neutrophils Absolute: 3521
Platelets: 148 — AB (ref 150–399)
Platelets: 148 — AB (ref 150–399)
WBC: 5.4
WBC: 5.4

## 2019-12-31 LAB — TSH
TSH: 5.42 (ref 0.41–5.90)
TSH: 5.42 (ref 0.41–5.90)

## 2019-12-31 LAB — CBC
RBC: 4.17 (ref 3.87–5.11)
RBC: 4.17 (ref 3.87–5.11)

## 2019-12-31 NOTE — Progress Notes (Signed)
Subjective:   Vincent Harper is a 84 y.o. male who presents for Medicare Annual/Subsequent preventive examination.  Review of Systems:   Cardiac Risk Factors include: advanced age (>17men, >71 women);obesity (BMI >30kg/m2);hypertension;dyslipidemia;male gender     Objective:    Vitals: There were no vitals taken for this visit.  There is no height or weight on file to calculate BMI.  Advanced Directives 12/31/2019 12/26/2019  Does Patient Have a Medical Advance Directive? Yes Yes  Type of Paramedic of [place] [address] [place];Living will [place];Living will  Does patient want to make changes to medical advance directive? No - Patient declined No - Guardian declined    Tobacco Social History   Tobacco Use  Smoking Status Never Smoker  Smokeless Tobacco Never Used     Counseling given: Not Answered   Clinical Intake:  Pre-visit preparation completed: Yes  Pain : No/denies pain     BMI - recorded: 32.71 Nutritional Status: BMI > 30  Obese Nutritional Risks: None Diabetes: No  How often do you need to have someone help you when you read instructions, pamphlets, or other written materials from your doctor or pharmacy?: 1 - Never What is the last grade level you completed in school?: college  Interpreter Needed?: No     Past Medical History:  Diagnosis Date  . History of colon polyps   . Hypercholesteremia    Per [facility] New Patient Packet.  . Hyperlipidemia   . Hypertension    Per [facility] New Patient Packet.  Marland Kitchen Post-nasal drip    Per [facility] Patient Packet.   Past Surgical History:  Procedure Laterality Date  . COLONOSCOPY  11/28/2016   [facility], Per [facility] New Patient Packet.  Marland Kitchen HERNIA REPAIR     umbilical and inguinal (unsure what side) hernia  . NOSE SURGERY     [place], Per [facility] New Patient Packet.  . TONSILLECTOMY     Per [facility] New Patient Packet.   Family History  Problem Relation Age of Onset  . Hypertension Mother   . Dementia  Father   . Diabetes Daughter    Social History   Socioeconomic History  . Marital status: Married    Spouse name: Shavon Tomson  . Number of children: 3  . Years of education: Not on file  . Highest education level: Not on file  Occupational History  . Not on file  Tobacco Use  . Smoking status: Never Smoker  . Smokeless tobacco: Never Used  Substance and Sexual Activity  . Alcohol use: Yes    Alcohol/week: 1.0 standard drinks    Types: 1 Cans of beer per week    Comment: 1 Beer Every 6 months.   . Drug use: No  . Sexual activity: Not on file  Other Topics Concern  . Not on file  Social History Narrative   Tobacco use, amount per day now: None   Past tobacco use, amount per day: None   How many years did you use tobacco: None   Alcohol use (drinks per week): 1 Beer every 6 months.   Diet:   Do you drink/eat things with caffeine: Diet Pepsi   Marital status: Married                                 What year were you married? 1964   Do you live in a house, apartment, assisted living, condo, trailer,  etc.? [facility] in [facility].   Is it one or more stories? 1   How many persons live in your home? 2   Do you have pets in your home?( please list) No   Highest Level of education completed: College   Current or past profession:    Do you exercise? Yes                                   Type and how often? Walk Daily.   Do you have a living will? Yes in 2020   Do you have a DNR form? No                                  If not, do you want to discuss one?   Do you have signed POA/HPOA forms? Yes in 2020                        If so, please bring to you appointment    Do you have any difficulty bathing or dressing yourself? No    Do you have difficulty preparing food or eating? No   Do you have difficulty managing your medications? No   Do you have any difficulty managing your finances? No   Do you have any difficulty affording your medications? No      Social  Determinants of Health   Financial Resource Strain:   . Difficulty of Paying Living Expenses: Not on file  Food Insecurity:   . Worried About Charity fundraiser in the Last Year: Not on file  . Ran Out of Food in the Last Year: Not on file  Transportation Needs:   . Lack of Transportation (Medical): Not on file  . Lack of Transportation (Non-Medical): Not on file  Physical Activity:   . Days of Exercise per Week: Not on file  . Minutes of Exercise per Session: Not on file  Stress:   . Feeling of Stress : Not on file  Social Connections:   . Frequency of Communication with Friends and Family: Not on file  . Frequency of Social Gatherings with Friends and Family: Not on file  . Attends Religious Services: Not on file  . Active Member of Clubs or Organizations: Not on file  . Attends Archivist Meetings: Not on file  . Marital Status: Not on file    Outpatient Encounter Medications as of 12/31/2019  Medication Sig  . enalapril (VASOTEC) 20 MG tablet Take 1 tablet (20 mg total) by mouth every morning.  . fluticasone (FLONASE) 50 MCG/ACT nasal spray Place 1 spray into both nostrils daily as needed for allergies or rhinitis.  Marland Kitchen guaiFENesin-Codeine (ROBAFEN AC PO) Take 5 mLs by mouth as needed. As needed for cough.  . rivaroxaban (XARELTO) 20 MG TABS tablet Take 1 tablet (20 mg total) by mouth at bedtime.  . sildenafil (VIAGRA) 100 MG tablet Take 100 mg by mouth daily as needed for erectile dysfunction.  . simvastatin (ZOCOR) 20 MG tablet Take 1 tablet (20 mg total) by mouth at bedtime.  . triamterene-hydrochlorothiazide (DYAZIDE) 37.5-25 MG capsule Take 1 each (1 capsule total) by mouth daily.  Marland Kitchen UNABLE TO FIND Saline Nose Spray As Needed.  . Undecylenic Ac-Zn Undecylenate (FUNGI-NAIL TOE & FOOT EX) Apply topically as needed. For Toe  Nail Fungus.   No facility-administered encounter medications on file as of 12/31/2019.    Activities of Daily Living In your present state of  health, do you have any difficulty performing the following activities: 12/31/2019  Hearing? Y  Vision? N  Difficulty concentrating or making decisions? N  Walking or climbing stairs? N  Dressing or bathing? N  Doing errands, shopping? N  Preparing Food and eating ? N  Using the Toilet? N  In the past six months, have you accidently leaked urine? N  Do you have problems with loss of bowel control? N  Managing your Medications? N  Managing your Finances? N  Housekeeping or managing your Housekeeping? N  Some recent data might be hidden    Patient Care Team: [name], NP as PCP - General (Geriatric Medicine)   Assessment:   This is a routine wellness examination for Vincent Harper.  Exercise Activities and Dietary recommendations Current Exercise Habits: Home exercise routine, Type of exercise: walking, Time (Minutes): 45, Frequency (Times/Week): 7, Weekly Exercise (Minutes/Week): 315  Goals    . Weight (lb) < 200 lb (90.7 kg)       Fall Risk Fall Risk  12/31/2019 12/26/2019  Falls in the past year? 0 0  Number falls in past yr: 0 0  Injury with Fall? 0 0   Is the patient's home free of loose throw rugs in walkways, pet beds, electrical cords, etc?   yes      Grab bars in the bathroom? yes      Handrails on the stairs?   no stairs      Adequate lighting?   yes  Timed Get Up and Go Performed: na  Depression Screen PHQ 2/9 Scores 12/31/2019  PHQ - 2 Score 0    Cognitive Function     6CIT Screen 12/31/2019  What Year? 0 points  What month? 0 points  What time? 0 points  Count back from 20 0 points  Months in reverse 0 points  Repeat phrase 0 points  Total Score 0    Immunization History  Administered Date(s) Administered  . Hepatitis A, Adult 10/28/2009  . IPV 10/28/2009  . Influenza, High Dose Seasonal PF 07/25/2017  . Influenza, Seasonal, Injecte, Preservative Fre 09/27/2006, 10/10/2007, 09/23/2008, 09/30/2009, 09/15/2010, 08/26/2011  . Influenza,inj,Quad  PF,6+ Mos 09/26/2012, 08/21/2015, 09/01/2017  . Influenza-Unspecified 08/25/2014, 08/07/2015, 08/16/2016, 08/29/2016, 09/13/2018, 11/28/2018, 08/23/2019  . PFIZER SARS-COV-2 Vaccination 11/29/2019  . Pneumococcal Conjugate-13 03/13/2014  . Pneumococcal Polysaccharide-23 09/29/2005  . Td 04/05/2000  . Tdap 02/29/2012  . Typhoid Inactivated 10/28/2009  . Zoster 03/17/2008  . Zoster Recombinat (Shingrix) 05/03/2018    Qualifies for Shingles Vaccine? Yes, completed  Screening Tests Health Maintenance  Topic Date Due  . TETANUS/TDAP  02/28/2022  . INFLUENZA VACCINE  Completed  . PNA vac Low Risk Adult  Completed   Cancer Screenings: Lung: Low Dose CT Chest recommended if Age 7-80 years, 30 pack-year currently smoking OR have quit w/in 15years. Patient does not qualify. Colorectal: aged out  Additional Screenings: na Hepatitis C Screening:      Plan:     I have personally reviewed and noted the following in the patient's chart:   . Medical and social history . Use of alcohol, tobacco or illicit drugs  . Current medications and supplements . Functional ability and status . Nutritional status . Physical activity . Advanced directives . List of other physicians . Hospitalizations, surgeries, and ER visits in previous 12 months . Vitals .  Screenings to include cognitive, depression, and falls . Referrals and appointments  In addition, I have reviewed and discussed with patient certain preventive protocols, quality metrics, and best practice recommendations. A written personalized care plan for preventive services as well as general preventive health recommendations were provided to patient.     [name], NP  12/31/2019

## 2019-12-31 NOTE — Progress Notes (Signed)
   This service is provided via telemedicine  No vital signs collected/recorded due to the encounter was a telemedicine visit.   Location of patient (ex: home, work):  Home  Patient consents to a telephone visit:  Yes  Location of the provider (ex: office, home):  [facility]   Name of any referring provider:  N/A  Names of all persons participating in the telemedicine service and their role in the encounter:  Patient, Vincent Harper, RMA, Sherrie Mustache, NP.   Time spent on call:  8 minutes on phone with Medical Assistant.

## 2019-12-31 NOTE — Patient Instructions (Addendum)
Vincent Harper , Thank you for taking time to come for your Medicare Wellness Visit. I appreciate your ongoing commitment to your health goals. Please review the following plan we discussed and let me know if I can assist you in the future.   Screening recommendations/referrals: Colonoscopy aged out Recommended yearly ophthalmology/optometry visit for glaucoma screening and checkup Recommended yearly dental visit for hygiene and checkup  Vaccinations: Influenza vaccine up to date Pneumococcal vaccine up to date Tdap vaccine up to date Shingles vaccine up to date    Advanced directives: on file  Conditions/risks identified: BMI >30. Weight loss through diet changes and increase in physical activity recommended    Next appointment: 1 year  Preventive Care 82 Years and Older, Male Preventive care refers to lifestyle choices and visits with your health care provider that can promote health and wellness. What does preventive care include?  A yearly physical exam. This is also called an annual well check.  Dental exams once or twice a year.  Routine eye exams. Ask your health care provider how often you should have your eyes checked.  Personal lifestyle choices, including:  Daily care of your teeth and gums.  Regular physical activity.  Eating a healthy diet.  Avoiding tobacco and drug use.  Limiting alcohol use.  Practicing safe sex.  Taking low doses of aspirin every day.  Taking vitamin and mineral supplements as recommended by your health care provider. What happens during an annual well check? The services and screenings done by your health care provider during your annual well check will depend on your age, overall health, lifestyle risk factors, and family history of disease. Counseling  Your health care provider may ask you questions about your:  Alcohol use.  Tobacco use.  Drug use.  Emotional well-being.  Home and relationship well-being.  Sexual activity.   Eating habits.  History of falls.  Memory and ability to understand (cognition).  Work and work Statistician. Screening  You may have the following tests or measurements:  Height, weight, and BMI.  Blood pressure.  Lipid and cholesterol levels. These may be checked every 5 years, or more frequently if you are over 82 years old.  Skin check.  Lung cancer screening. You may have this screening every year starting at age 82 if you have a 30-pack-year history of smoking and currently smoke or have quit within the past 15 years.  Fecal occult blood test (FOBT) of the stool. You may have this test every year starting at age 82.  Flexible sigmoidoscopy or colonoscopy. You may have a sigmoidoscopy every 5 years or a colonoscopy every 10 years starting at age 82.  Prostate cancer screening. Recommendations will vary depending on your family history and other risks.  Hepatitis C blood test.  Hepatitis B blood test.  Sexually transmitted disease (STD) testing.  Diabetes screening. This is done by checking your blood sugar (glucose) after you have not eaten for a while (fasting). You may have this done every 1-3 years.  Abdominal aortic aneurysm (AAA) screening. You may need this if you are a current or former smoker.  Osteoporosis. You may be screened starting at age 82 if you are at high risk. Talk with your health care provider about your test results, treatment options, and if necessary, the need for more tests. Vaccines  Your health care provider may recommend certain vaccines, such as:  Influenza vaccine. This is recommended every year.  Tetanus, diphtheria, and acellular pertussis (Tdap, Td) vaccine. You may  need a Td booster every 10 years.  Zoster vaccine. You may need this after age 82.  Pneumococcal 13-valent conjugate (PCV13) vaccine. One dose is recommended after age 82.  Pneumococcal polysaccharide (PPSV23) vaccine. One dose is recommended after age 82. Talk to  your health care provider about which screenings and vaccines you need and how often you need them. This information is not intended to replace advice given to you by your health care provider. Make sure you discuss any questions you have with your health care provider. Document Released: 12/11/2015 Document Revised: 08/03/2016 Document Reviewed: 09/15/2015 Elsevier Interactive Patient Education  2017 [place] Prevention in the Home Falls can cause injuries. They can happen to people of all ages. There are many things you can do to make your home safe and to help prevent falls. What can I do on the outside of my home?  Regularly fix the edges of walkways and driveways and fix any cracks.  Remove anything that might make you trip as you walk through a door, such as a raised step or threshold.  Trim any bushes or trees on the path to your home.  Use bright outdoor lighting.  Clear any walking paths of anything that might make someone trip, such as rocks or tools.  Regularly check to see if handrails are loose or broken. Make sure that both sides of any steps have handrails.  Any raised decks and porches should have guardrails on the edges.  Have any leaves, snow, or ice cleared regularly.  Use sand or salt on walking paths during winter.  Clean up any spills in your garage right away. This includes oil or grease spills. What can I do in the bathroom?  Use night lights.  Install grab bars by the toilet and in the tub and shower. Do not use towel bars as grab bars.  Use non-skid mats or decals in the tub or shower.  If you need to sit down in the shower, use a plastic, non-slip stool.  Keep the floor dry. Clean up any water that spills on the floor as soon as it happens.  Remove soap buildup in the tub or shower regularly.  Attach bath mats securely with double-sided non-slip rug tape.  Do not have throw rugs and other things on the floor that can make you trip.  What can I do in the bedroom?  Use night lights.  Make sure that you have a light by your bed that is easy to reach.  Do not use any sheets or blankets that are too big for your bed. They should not hang down onto the floor.  Have a firm chair that has side arms. You can use this for support while you get dressed.  Do not have throw rugs and other things on the floor that can make you trip. What can I do in the kitchen?  Clean up any spills right away.  Avoid walking on wet floors.  Keep items that you use a lot in easy-to-reach places.  If you need to reach something above you, use a strong step stool that has a grab bar.  Keep electrical cords out of the way.  Do not use floor polish or wax that makes floors slippery. If you must use wax, use non-skid floor wax.  Do not have throw rugs and other things on the floor that can make you trip. What can I do with my stairs?  Do not leave any items on  the stairs.  Make sure that there are handrails on both sides of the stairs and use them. Fix handrails that are broken or loose. Make sure that handrails are as long as the stairways.  Check any carpeting to make sure that it is firmly attached to the stairs. Fix any carpet that is loose or worn.  Avoid having throw rugs at the top or bottom of the stairs. If you do have throw rugs, attach them to the floor with carpet tape.  Make sure that you have a light switch at the top of the stairs and the bottom of the stairs. If you do not have them, ask someone to add them for you. What else can I do to help prevent falls?  Wear shoes that:  Do not have high heels.  Have rubber bottoms.  Are comfortable and fit you well.  Are closed at the toe. Do not wear sandals.  If you use a stepladder:  Make sure that it is fully opened. Do not climb a closed stepladder.  Make sure that both sides of the stepladder are locked into place.  Ask someone to hold it for you, if possible.   Clearly mark and make sure that you can see:  Any grab bars or handrails.  First and last steps.  Where the edge of each step is.  Use tools that help you move around (mobility aids) if they are needed. These include:  Canes.  Walkers.  Scooters.  Crutches.  Turn on the lights when you go into a dark area. Replace any light bulbs as soon as they burn out.  Set up your furniture so you have a clear path. Avoid moving your furniture around.  If any of your floors are uneven, fix them.  If there are any pets around you, be aware of where they are.  Review your medicines with your doctor. Some medicines can make you feel dizzy. This can increase your chance of falling. Ask your doctor what other things that you can do to help prevent falls. This information is not intended to replace advice given to you by your health care provider. Make sure you discuss any questions you have with your health care provider. Document Released: 09/10/2009 Document Revised: 04/21/2016 Document Reviewed: 12/19/2014 Elsevier Interactive Patient Education  2017 [employer].

## 2020-01-01 ENCOUNTER — Ambulatory Visit: Payer: PRIVATE HEALTH INSURANCE | Admitting: Internal Medicine

## 2020-01-02 ENCOUNTER — Telehealth: Payer: Self-pay

## 2020-01-02 NOTE — Telephone Encounter (Signed)
Opened in Error.

## 2020-01-02 NOTE — Telephone Encounter (Addendum)
Patient "Vincent Harper" called and labs discussed with patient and wife. Patient & Wife was made aware "Darrall Mata" TSH Electrolytes, Liver, Enzymes are stable. Cholesterol looks good, HDL is lower and needs to increase exercise. Kidney Function has slowly declined over the last few years but still okay. Only slightly decline and will monitor. Patient is aware to make sure to stay hydrated and avoid NSAIDS.

## 2020-01-14 ENCOUNTER — Other Ambulatory Visit: Payer: Self-pay | Admitting: *Deleted

## 2020-01-14 MED ORDER — SILDENAFIL CITRATE 100 MG PO TABS: 100.0000 mg | ORAL_TABLET | Freq: Every day | ORAL | 1 refills | Status: DC | PRN

## 2020-01-14 NOTE — Telephone Encounter (Signed)
[facility]

## 2020-01-15 ENCOUNTER — Encounter: Payer: Self-pay | Admitting: Nurse Practitioner

## 2020-01-15 NOTE — Telephone Encounter (Signed)
Message routed to [place], Jessica K, NP  

## 2020-01-17 ENCOUNTER — Ambulatory Visit: Payer: PRIVATE HEALTH INSURANCE

## 2020-01-18 ENCOUNTER — Other Ambulatory Visit: Payer: Self-pay

## 2020-01-18 DIAGNOSIS — Z20822 Contact with and (suspected) exposure to covid-19: Secondary | ICD-10-CM

## 2020-01-19 LAB — NOVEL CORONAVIRUS, NAA: SARS-CoV-2, NAA: NOT DETECTED

## 2020-01-20 ENCOUNTER — Ambulatory Visit: Payer: PRIVATE HEALTH INSURANCE

## 2020-01-24 ENCOUNTER — Ambulatory Visit: Payer: PRIVATE HEALTH INSURANCE | Admitting: Internal Medicine

## 2020-02-26 ENCOUNTER — Encounter: Payer: Self-pay | Admitting: Nurse Practitioner

## 2020-03-12 ENCOUNTER — Other Ambulatory Visit: Payer: Self-pay

## 2020-03-12 ENCOUNTER — Ambulatory Visit: Payer: Medicare Other | Admitting: Nurse Practitioner

## 2020-03-12 ENCOUNTER — Encounter: Payer: Self-pay | Admitting: Nurse Practitioner

## 2020-03-12 VITALS — BP 128/72 | HR 62 | Temp 98.7°F | Resp 16 | Ht 67.5 in | Wt 208.0 lb

## 2020-03-12 DIAGNOSIS — I10 Essential (primary) hypertension: Secondary | ICD-10-CM | POA: Diagnosis not present

## 2020-03-12 DIAGNOSIS — I824Y1 Acute embolism and thrombosis of unspecified deep veins of right proximal lower extremity: Secondary | ICD-10-CM

## 2020-03-12 DIAGNOSIS — E782 Mixed hyperlipidemia: Secondary | ICD-10-CM | POA: Diagnosis not present

## 2020-03-12 DIAGNOSIS — N529 Male erectile dysfunction, unspecified: Secondary | ICD-10-CM

## 2020-03-12 DIAGNOSIS — E039 Hypothyroidism, unspecified: Secondary | ICD-10-CM

## 2020-03-12 MED ORDER — SILDENAFIL CITRATE 100 MG PO TABS: 100.0000 mg | ORAL_TABLET | Freq: Every day | ORAL | 1 refills | Status: DC | PRN

## 2020-03-12 NOTE — Patient Instructions (Signed)
Please let us know anything if you need anything while in [place] We can do mychart of televisit  Let us know when you want to do lab work in July.

## 2020-03-12 NOTE — Progress Notes (Signed)
Careteam: Patient Care Team: [name], NP as PCP - General (Geriatric Medicine)  Advanced Directive information Does Patient Have a Medical Advance Directive?: Yes, Type of Advance Directive: [place];Living will, Does patient want to make changes to medical advance directive?: No - Patient declined  No Known Allergies  Chief Complaint  Patient presents with  . Follow-up    3 Month Follow Up     HPI: Patient is a 84 y.o. male seen in today at the [facility] for routine follow up.    Pt with hx of chronic low back pain, hyperlipidemia, htn, hypothyroid, chronic DVT.   Getting ready to go to [facility] later this week.   He got a puppy, he fell off the bed and scratched him trying to get on the bed  His biggest concern is his wife with her rectal bleeding.   Walking more with his dog, has lost some weight. Plans to get down 185-190lbs while in [place].    Review of Systems:  Review of Systems  Constitutional: Negative for chills, fever and weight loss.  HENT: Negative for tinnitus.   Respiratory: Negative for cough, sputum production and shortness of breath.   Cardiovascular: Negative for chest pain, palpitations and leg swelling.  Gastrointestinal: Negative for abdominal pain, constipation, diarrhea and heartburn.  Genitourinary: Negative for dysuria, frequency and urgency.  Musculoskeletal: Negative for back pain, falls, joint pain and myalgias.  Skin: Negative.   Neurological: Negative for dizziness and headaches.  Psychiatric/Behavioral: Negative for depression and memory loss. The patient does not have insomnia.     Past Medical History:  Diagnosis Date  . History of colon polyps   . Hypercholesteremia    Per [facility] New Patient Packet.  . Hyperlipidemia   . Hypertension    Per [facility] New Patient Packet.  Marland Kitchen Post-nasal drip    Per [place] Patient Packet.   Past Surgical History:  Procedure Laterality Date  . COLONOSCOPY   11/28/2016   [facility], Per [facility] New Patient Packet.  Marland Kitchen HERNIA REPAIR     umbilical and inguinal (unsure what side) hernia  . NOSE SURGERY     [place], Per [facility] New Patient Packet.  . TONSILLECTOMY     Per [facility] New Patient Packet.   Social History:   reports that he has never smoked. He has never used smokeless tobacco. He reports current alcohol use of about 1.0 standard drinks of alcohol per week. He reports that he does not use drugs.  Family History  Problem Relation Age of Onset  . Hypertension Mother   . Dementia Father   . Diabetes Daughter     Medications: Patient's Medications  New Prescriptions   No medications on file  Previous Medications   ENALAPRIL (VASOTEC) 20 MG TABLET    Take 1 tablet (20 mg total) by mouth every morning.   FLUTICASONE (FLONASE) 50 MCG/ACT NASAL SPRAY    Place 1 spray into both nostrils daily as needed for allergies or rhinitis.   GUAIFENESIN-CODEINE (ROBAFEN AC PO)    Take 5 mLs by mouth as needed. As needed for cough.   RIVAROXABAN (XARELTO) 20 MG TABS TABLET    Take 1 tablet (20 mg total) by mouth at bedtime.   SILDENAFIL (VIAGRA) 100 MG TABLET    Take 1 tablet (100 mg total) by mouth daily as needed for erectile dysfunction.   SIMVASTATIN (ZOCOR) 20 MG TABLET    Take 1 tablet (20 mg total)  by mouth at bedtime.   TRIAMTERENE-HYDROCHLOROTHIAZIDE (DYAZIDE) 37.5-25 MG CAPSULE    Take 1 each (1 capsule total) by mouth daily.   UNABLE TO FIND    Saline Nose Spray As Needed.   UNDECYLENIC AC-ZN UNDECYLENATE (FUNGI-NAIL TOE & FOOT EX)    Apply topically as needed. For Toe Nail Fungus.  Modified Medications   No medications on file  Discontinued Medications   No medications on file    Physical Exam:  Vitals:   03/12/20 0826  BP: 128/72  Pulse: 62  Resp: 16  Temp: 98.7 F (37.1 C)  SpO2: 92%  Weight: 208 lb (94.3 kg)  Height: 5' 7.5" (1.715 m)   Body mass index is 32.1 kg/m. Wt Readings from Last 3 Encounters:  03/12/20 208 lb (94.3 kg)    12/26/19 212 lb (96.2 kg)  01/20/14 213 lb 8 oz (96.8 kg)    Physical Exam Constitutional:      General: He is not in acute distress.    Appearance: He is well-developed. He is not diaphoretic.  HENT:     Head: Normocephalic and atraumatic.     Mouth/Throat:     Pharynx: No oropharyngeal exudate.  Eyes:     Conjunctiva/sclera: Conjunctivae normal.     Pupils: Pupils are equal, round, and reactive to light.  Cardiovascular:     Rate and Rhythm: Normal rate and regular rhythm.     Heart sounds: Normal heart sounds.  Pulmonary:     Effort: Pulmonary effort is normal.     Breath sounds: Normal breath sounds.  Abdominal:     General: Bowel sounds are normal.     Palpations: Abdomen is soft.  Musculoskeletal:        General: No tenderness.     Cervical back: Normal range of motion and neck supple.  Skin:    General: Skin is warm and dry.  Neurological:     Mental Status: He is alert and oriented to person, place, and time.  Psychiatric:        Mood and Affect: Mood normal.        Behavior: Behavior normal.     Labs reviewed: Basic Metabolic Panel: Recent Labs    12/31/19 0000  NA 141  K 4.0  CL 106  CO2 26*  BUN 28*  CREATININE 1.2  CALCIUM 8.9  TSH 5.42   Liver Function Tests: Recent Labs    12/31/19 0000  AST 13*  ALT 11  ALKPHOS 65  ALBUMIN 3.9   No results for input(s): LIPASE, AMYLASE in the last 8760 hours. No results for input(s): AMMONIA in the last 8760 hours. CBC: Recent Labs    12/31/19 0000  WBC 5.4  NEUTROABS 3,521  HGB 13.4*  HCT 39*  PLT 148*   Lipid Panel: Recent Labs    12/31/19 0000  CHOL 136  HDL 32*  LDLCALC 84  TRIG 106   TSH: Recent Labs    12/31/19 0000  TSH 5.42   A1C: No results found for: HGBA1C   Assessment/Plan 1. Essential hypertension -well controlled on enalapril,triamterene-hctz cont on medication and lifestyle modifications.  2. Erectile dysfunction, unspecified erectile dysfunction type -  sildenafil (VIAGRA) 100 MG tablet; Take 1 tablet (100 mg total) by mouth daily as needed for erectile dysfunction.  Dispense: 10 tablet; Refill: 1  3. Deep vein thrombosis (DVT) of proximal vein of right lower extremity, unspecified chronicity (HCC) Stable, no signs of recurrence continues on xarelto 20 mg dialy  4.  Mixed hyperlipidemia Controlled on zocor 20 mg daily   5. Acquired hypothyroidism Not requiring supplement, TSH at goal  Next appt: 6 months, plans to schedule labs in July at [facility], will obtain CBC and CMP at this time.  Carlos American. [place], [place] [phone]

## 2020-04-21 ENCOUNTER — Telehealth: Payer: Self-pay

## 2020-04-21 DIAGNOSIS — I824Y1 Acute embolism and thrombosis of unspecified deep veins of right proximal lower extremity: Secondary | ICD-10-CM

## 2020-04-21 NOTE — Telephone Encounter (Signed)
Refill request received from pharmacy [facility] for [facility]. Refill too early

## 2020-04-23 ENCOUNTER — Encounter: Payer: Self-pay | Admitting: Nurse Practitioner

## 2020-04-23 DIAGNOSIS — I824Y1 Acute embolism and thrombosis of unspecified deep veins of right proximal lower extremity: Secondary | ICD-10-CM

## 2020-04-24 MED ORDER — RIVAROXABAN 20 MG PO TABS: 20.0000 mg | ORAL_TABLET | Freq: Every day | ORAL | 1 refills | Status: DC

## 2020-04-28 MED ORDER — RIVAROXABAN 20 MG PO TABS: 20.0000 mg | ORAL_TABLET | Freq: Every day | ORAL | 1 refills | Status: DC

## 2020-06-23 ENCOUNTER — Encounter: Payer: Self-pay | Admitting: Nurse Practitioner

## 2020-06-29 LAB — BASIC METABOLIC PANEL
BUN: 27 — AB (ref 4–21)
CO2: 31 — AB (ref 13–22)
Chloride: 105 (ref 99–108)
Creatinine: 1.2 (ref <=1.3)
Glucose: 110
Potassium: 4.1 (ref 3.4–5.3)
Sodium: 141 (ref 137–147)

## 2020-06-29 LAB — COMPREHENSIVE METABOLIC PANEL
Albumin: 4 (ref 3.5–5.0)
Calcium: 9.4 (ref 8.7–10.7)
GFR calc Af Amer: 63
GFR calc non Af Amer: 54
Globulin: 3.5

## 2020-06-29 LAB — CBC: RBC: 4.27 (ref 3.87–5.11)

## 2020-06-29 LAB — HEPATIC FUNCTION PANEL
ALT: 13 (ref 10–40)
AST: 16 (ref 14–40)
Alkaline Phosphatase: 78 (ref 25–125)

## 2020-06-29 LAB — CBC AND DIFFERENTIAL
HCT: 41 (ref 41–53)
Hemoglobin: 13.6 (ref 13.5–17.5)
Neutrophils Absolute: 3581
Platelets: 158 (ref 150–399)
WBC: 5.5

## 2020-07-01 ENCOUNTER — Telehealth: Payer: Self-pay

## 2020-07-01 NOTE — Telephone Encounter (Signed)
Patient called back and he was given his lab results. He verbalized his understanding and had no questions or concerns.

## 2020-07-01 NOTE — Telephone Encounter (Signed)
Called and left message with patient's wife for him to give the office a call for lab results.

## 2020-08-06 ENCOUNTER — Encounter: Payer: Self-pay | Admitting: Nurse Practitioner

## 2020-08-07 ENCOUNTER — Encounter: Payer: Self-pay | Admitting: Nurse Practitioner

## 2020-09-17 ENCOUNTER — Ambulatory Visit: Payer: Medicare Other | Admitting: Nurse Practitioner

## 2020-10-13 ENCOUNTER — Encounter: Payer: Self-pay | Admitting: Nurse Practitioner

## 2020-10-13 ENCOUNTER — Ambulatory Visit: Payer: Medicare Other | Admitting: Nurse Practitioner

## 2020-10-13 ENCOUNTER — Other Ambulatory Visit: Payer: Self-pay

## 2020-10-13 VITALS — BP 110/60 | HR 63 | Temp 97.4°F | Ht 67.0 in | Wt 200.0 lb

## 2020-10-13 DIAGNOSIS — I824Y1 Acute embolism and thrombosis of unspecified deep veins of right proximal lower extremity: Secondary | ICD-10-CM

## 2020-10-13 DIAGNOSIS — E782 Mixed hyperlipidemia: Secondary | ICD-10-CM

## 2020-10-13 DIAGNOSIS — I10 Essential (primary) hypertension: Secondary | ICD-10-CM

## 2020-10-13 DIAGNOSIS — E039 Hypothyroidism, unspecified: Secondary | ICD-10-CM

## 2020-10-13 DIAGNOSIS — R0982 Postnasal drip: Secondary | ICD-10-CM

## 2020-10-13 DIAGNOSIS — S00511A Abrasion of lip, initial encounter: Secondary | ICD-10-CM

## 2020-10-13 MED ORDER — HYDROCHLOROTHIAZIDE 25 MG PO TABS: 25.0000 mg | ORAL_TABLET | Freq: Every day | ORAL | 3 refills | Status: DC

## 2020-10-13 NOTE — Progress Notes (Signed)
Careteam: Patient Care Team: Vincent Chandler, NP as PCP - General (Geriatric Medicine)  Advanced Directive information Does Patient Have a Medical Advance Directive?: Yes, Type of Advance Directive: [place];Living will, Does patient want to make changes to medical advance directive?: No - Patient declined  No Known Allergies  Chief Complaint  Patient presents with  . Medical Management of Chronic Issues    6 month follow up Patient has no concerns  . Best Practice Recommendations    Covid-19 vaccine, Flu vaccine     HPI: Patient is a 84 y.o. male seen in today at the [facility] for routine follow up. Doing well. Keeps going as long as he can.   Reports he has increase in congestion at night. Used flonase and then now using saline with hot shower which helps.  Feeling well rested other than getting up several times in the night. Reports he feels well rested in the morning.   bph- gets up 3 times at night for urination. Has not increased recently. Talked to a previous doctor and was educated about medication but he does not feel like he needs that at this time.    While in [place] he lost down to 190 lb due to increase in activity. While here at [facility] he will be walking with his dog.   Hx of DVT- on xarelto. No abnormal bleeding or bruising.   number 1 issue is worrying about his wife.   Dog scratched his lip in august and it has been slow to heal. Reports he has been using carmex to keep lips moisturized as they have been cracking  Review of Systems:  Review of Systems  Constitutional: Negative for chills, fever and weight loss.  HENT: Positive for congestion. Negative for tinnitus.   Respiratory: Negative for cough, sputum production and shortness of breath.   Cardiovascular: Negative for chest pain, palpitations and leg swelling.  Gastrointestinal: Negative for abdominal pain, constipation, diarrhea and heartburn.  Genitourinary: Negative  for dysuria, frequency and urgency.       Nocturia   Musculoskeletal: Negative for back pain, falls, joint pain and myalgias.  Skin: Negative.   Neurological: Negative for dizziness and headaches.  Psychiatric/Behavioral: Negative for depression and memory loss. The patient does not have insomnia.     Past Medical History:  Diagnosis Date  . History of colon polyps   . Hypercholesteremia    Per [facility] New Patient Packet.  . Hyperlipidemia   . Hypertension    Per [facility] New Patient Packet.  Marland Kitchen Post-nasal drip    Per [place] Patient Packet.   Past Surgical History:  Procedure Laterality Date  . COLONOSCOPY  11/28/2016   [facility], Per [facility] New Patient Packet.  Marland Kitchen HERNIA REPAIR     umbilical and inguinal (unsure what side) hernia  . NOSE SURGERY     [place], Per [facility] New Patient Packet.  . TONSILLECTOMY     Per [place] New Patient Packet.   Social History:   reports that he has never smoked. He has never used smokeless tobacco. He reports current alcohol use of about 1.0 standard drink of alcohol per week. He reports that he does not use drugs.  Family History  Problem Relation Age of Onset  . Hypertension Mother   . Dementia Father   . Diabetes Daughter     Medications: Patient's Medications  New Prescriptions   No medications on file  Previous Medications   ENALAPRIL (  VASOTEC) 20 MG TABLET    Take 1 tablet (20 mg total) by mouth every morning.   FLUTICASONE (FLONASE) 50 MCG/ACT NASAL SPRAY    Place 1 spray into both nostrils daily as needed for allergies or rhinitis.   GUAIFENESIN-CODEINE (ROBAFEN AC PO)    Take 5 mLs by mouth as needed. As needed for cough.   RIVAROXABAN (XARELTO) 20 MG TABS TABLET    Take 1 tablet (20 mg total) by mouth at bedtime.   SILDENAFIL (VIAGRA) 100 MG TABLET    Take 1 tablet (100 mg total) by mouth daily as needed for erectile dysfunction.   SIMVASTATIN (ZOCOR) 20 MG TABLET    Take 1 tablet (20 mg total) by mouth at bedtime.    TRIAMTERENE-HYDROCHLOROTHIAZIDE (DYAZIDE) 37.5-25 MG CAPSULE    Take 1 each (1 capsule total) by mouth daily.   UNABLE TO FIND    Saline Nose Spray As Needed.   UNDECYLENIC AC-ZN UNDECYLENATE (FUNGI-NAIL TOE & FOOT EX)    Apply topically as needed. For Toe Nail Fungus.  Modified Medications   No medications on file  Discontinued Medications   No medications on file    Physical Exam:  Vitals:   10/13/20 1004  BP: 110/60  Pulse: 63  Temp: (!) 97.4 F (36.3 C)  TempSrc: Temporal  SpO2: 99%  Weight: 200 lb (90.7 kg)  Height: 5\' 7"  (1.702 m)   Body mass index is 31.32 kg/m. Wt Readings from Last 3 Encounters:  10/13/20 200 lb (90.7 kg)  03/12/20 208 lb (94.3 kg)  12/26/19 212 lb (96.2 kg)    Physical Exam Constitutional:      General: He is not in acute distress.    Appearance: He is well-developed. He is not diaphoretic.  HENT:     Head: Normocephalic and atraumatic.     Mouth/Throat:     Pharynx: No oropharyngeal exudate.     Comments: Small abrasion noted on lower lip, appears to be healing, no signs of infection noted.  Eyes:     Conjunctiva/sclera: Conjunctivae normal.     Pupils: Pupils are equal, round, and reactive to light.  Cardiovascular:     Rate and Rhythm: Normal rate and regular rhythm.     Heart sounds: Normal heart sounds.  Pulmonary:     Effort: Pulmonary effort is normal.     Breath sounds: Normal breath sounds.  Abdominal:     General: Bowel sounds are normal.     Palpations: Abdomen is soft.  Musculoskeletal:        General: No tenderness.     Cervical back: Normal range of motion and neck supple.  Skin:    General: Skin is warm and dry.  Neurological:     Mental Status: He is alert and oriented to person, place, and time.  Psychiatric:        Mood and Affect: Mood normal.        Behavior: Behavior normal.     Labs reviewed: Basic Metabolic Panel: Recent Labs    12/31/19 0000 06/29/20 0000  NA 141 141  K 4.0 4.1  CL 106 105  CO2  26* 31*  BUN 28* 27*  CREATININE 1.2 1.2  CALCIUM 8.9 9.4  TSH 5.42  --    Liver Function Tests: Recent Labs    12/31/19 0000 06/29/20 0000  AST 13* 16  ALT 11 13  ALKPHOS 65 78  ALBUMIN 3.9 4.0   No results for input(s): LIPASE, AMYLASE in the last  8760 hours. No results for input(s): AMMONIA in the last 8760 hours. CBC: Recent Labs    12/31/19 0000 06/29/20 0000  WBC 5.4 5.5  NEUTROABS 3,521 3,581  HGB 13.4* 13.6  HCT 39* 41  PLT 148* 158   Lipid Panel: Recent Labs    12/31/19 0000  CHOL 136  HDL 32*  LDLCALC 84  TRIG 106   TSH: Recent Labs    12/31/19 0000  TSH 5.42   A1C: No results found for: HGBA1C   Assessment/Plan 1. Essential hypertension Well controlled, increase frequency with urination with age will DC combination triamterene hctz and start hctz 25 mg daily, continue low sodium diet and goal BP <140/90.  - hydrochlorothiazide (HYDRODIURIL) 25 MG tablet; Take 1 tablet (25 mg total) by mouth daily.  Dispense: 90 tablet; Refill: 3 -will follow up cmp in 6 weeks.   2. Deep vein thrombosis (DVT) of proximal vein of right lower extremity, unspecified chronicity (HCC) No signs of recurrence, continues on xarelto.   3. Acquired hypothyroidism TSH stable on last labs, not currently requiring medications  4. Mixed hyperlipidemia Continues on zocor, will follow up lipids in 6 weeks.   5. Post-nasal drip -stable with saline. Has flonase to use PRN   6. Abrasion of skin of lip Noted after injury with swimming with dog, slow to heal. No signs of infection. Dentist did not offer any additional treatment but to monitor. Would continue to keep moisturized as he reports he reinjuries in the evening. Monitor for infection. To notify if continues to not to progress to heal or complications occur- then would dermatology referral would be next step.   Next appt: 5 months.  Vincent Harper. [place], [place] [facility] [phone]

## 2020-10-13 NOTE — Patient Instructions (Signed)
To stop triamterene hctz  And start hydrochlorothiazide 25 mg by mouth daily for blood pressure  Monitor BP <140/90 goal.    Lab work in 6 weeks.    DASH Eating Plan DASH stands for "Dietary Approaches to Stop Hypertension." The DASH eating plan is a healthy eating plan that has been shown to reduce high blood pressure (hypertension). It may also reduce your risk for type 2 diabetes, heart disease, and stroke. The DASH eating plan may also help with weight loss. What are tips for following this plan?  General guidelines  Avoid eating more than 2,300 mg (milligrams) of salt (sodium) a day. If you have hypertension, you may need to reduce your sodium intake to 1,500 mg a day.  Limit alcohol intake to no more than 1 drink a day for nonpregnant women and 2 drinks a day for men. One drink equals 12 oz of beer, 5 oz of wine, or 1 oz of hard liquor.  Work with your health care provider to maintain a healthy body weight or to lose weight. Ask what an ideal weight is for you.  Get at least 30 minutes of exercise that causes your heart to beat faster (aerobic exercise) most days of the week. Activities may include walking, swimming, or biking.  Work with your health care provider or diet and nutrition specialist (dietitian) to adjust your eating plan to your individual calorie needs. Reading food labels   Check food labels for the amount of sodium per serving. Choose foods with less than 5 percent of the Daily Value of sodium. Generally, foods with less than 300 mg of sodium per serving fit into this eating plan.  To find whole grains, look for the word "whole" as the first word in the ingredient list. Shopping  Buy products labeled as "low-sodium" or "no salt added."  Buy fresh foods. Avoid canned foods and premade or frozen meals. Cooking  Avoid adding salt when cooking. Use salt-free seasonings or herbs instead of table salt or sea salt. Check with your health care provider or pharmacist  before using salt substitutes.  Do not fry foods. Cook foods using healthy methods such as baking, boiling, grilling, and broiling instead.  Cook with heart-healthy oils, such as olive, canola, soybean, or sunflower oil. Meal planning  Eat a balanced diet that includes: ? 5 or more servings of fruits and vegetables each day. At each meal, try to fill half of your plate with fruits and vegetables. ? Up to 6-8 servings of whole grains each day. ? Less than 6 oz of lean meat, poultry, or fish each day. A 3-oz serving of meat is about the same size as a deck of cards. One egg equals 1 oz. ? 2 servings of low-fat dairy each day. ? A serving of nuts, seeds, or beans 5 times each week. ? Heart-healthy fats. Healthy fats called Omega-3 fatty acids are found in foods such as flaxseeds and coldwater fish, like sardines, salmon, and mackerel.  Limit how much you eat of the following: ? Canned or prepackaged foods. ? Food that is high in trans fat, such as fried foods. ? Food that is high in saturated fat, such as fatty meat. ? Sweets, desserts, sugary drinks, and other foods with added sugar. ? Full-fat dairy products.  Do not salt foods before eating.  Try to eat at least 2 vegetarian meals each week.  Eat more home-cooked food and less restaurant, buffet, and fast food.  When eating at a restaurant,  ask that your food be prepared with less salt or no salt, if possible. What foods are recommended? The items listed may not be a complete list. Talk with your dietitian about what dietary choices are best for you. Grains Whole-grain or whole-wheat bread. Whole-grain or whole-wheat pasta. Brown rice. [place]. Bulgur. Whole-grain and low-sodium cereals. Pita bread. Low-fat, low-sodium crackers. Whole-wheat flour tortillas. Vegetables Fresh or frozen vegetables (raw, steamed, roasted, or grilled). Low-sodium or reduced-sodium tomato and vegetable juice. Low-sodium or reduced-sodium tomato  sauce and tomato paste. Low-sodium or reduced-sodium canned vegetables. Fruits All fresh, dried, or frozen fruit. Canned fruit in natural juice (without added sugar). Meat and other protein foods Skinless chicken or [place]. Ground chicken or [place]. Pork with fat trimmed off. Fish and seafood. Egg whites. Dried beans, peas, or lentils. Unsalted nuts, nut butters, and seeds. Unsalted canned beans. Lean cuts of beef with fat trimmed off. Low-sodium, lean deli meat. Dairy Low-fat (1%) or fat-free (skim) milk. Fat-free, low-fat, or reduced-fat cheeses. Nonfat, low-sodium ricotta or cottage cheese. Low-fat or nonfat yogurt. Low-fat, low-sodium cheese. Fats and oils Soft margarine without trans fats. Vegetable oil. Low-fat, reduced-fat, or light mayonnaise and salad dressings (reduced-sodium). Canola, safflower, olive, soybean, and sunflower oils. Avocado. Seasoning and other foods Herbs. Spices. Seasoning mixes without salt. Unsalted popcorn and pretzels. Fat-free sweets. What foods are not recommended? The items listed may not be a complete list. Talk with your dietitian about what dietary choices are best for you. Grains Baked goods made with fat, such as croissants, muffins, or some breads. Dry pasta or rice meal packs. Vegetables Creamed or fried vegetables. Vegetables in a cheese sauce. Regular canned vegetables (not low-sodium or reduced-sodium). Regular canned tomato sauce and paste (not low-sodium or reduced-sodium). Regular tomato and vegetable juice (not low-sodium or reduced-sodium). Angie Fava. Olives. Fruits Canned fruit in a light or heavy syrup. Fried fruit. Fruit in cream or butter sauce. Meat and other protein foods Fatty cuts of meat. Ribs. Fried meat. Berniece Salines. Sausage. Bologna and other processed lunch meats. Salami. Fatback. Hotdogs. Bratwurst. Salted nuts and seeds. Canned beans with added salt. Canned or smoked fish. Whole eggs or egg yolks. Chicken or [place] with skin. Dairy Whole  or 2% milk, cream, and half-and-half. Whole or full-fat cream cheese. Whole-fat or sweetened yogurt. Full-fat cheese. Nondairy creamers. Whipped toppings. Processed cheese and cheese spreads. Fats and oils Butter. Stick margarine. Lard. Shortening. Ghee. Bacon fat. Tropical oils, such as coconut, palm kernel, or palm oil. Seasoning and other foods Salted popcorn and pretzels. Onion salt, garlic salt, seasoned salt, table salt, and sea salt. Worcestershire sauce. Tartar sauce. Barbecue sauce. Teriyaki sauce. Soy sauce, including reduced-sodium. Steak sauce. Canned and packaged gravies. Fish sauce. Oyster sauce. Cocktail sauce. Horseradish that you find on the shelf. Ketchup. Mustard. Meat flavorings and tenderizers. Bouillon cubes. Hot sauce and Tabasco sauce. Premade or packaged marinades. Premade or packaged taco seasonings. Relishes. Regular salad dressings. Where to find more information:  National Heart, Lung, and [place]: https://wilson-eaton.com/  American Heart Association: www.heart.org Summary  The DASH eating plan is a healthy eating plan that has been shown to reduce high blood pressure (hypertension). It may also reduce your risk for type 2 diabetes, heart disease, and stroke.  With the DASH eating plan, you should limit salt (sodium) intake to 2,300 mg a day. If you have hypertension, you may need to reduce your sodium intake to 1,500 mg a day.  When on the DASH eating plan, aim to eat more fresh  fruits and vegetables, whole grains, lean proteins, low-fat dairy, and heart-healthy fats.  Work with your health care provider or diet and nutrition specialist (dietitian) to adjust your eating plan to your individual calorie needs. This information is not intended to replace advice given to you by your health care provider. Make sure you discuss any questions you have with your health care provider. Document Revised: 10/27/2017 Document Reviewed: 11/07/2016 Elsevier Patient Education   2020 [employer].

## 2020-11-16 ENCOUNTER — Encounter: Payer: Self-pay | Admitting: *Deleted

## 2020-11-16 LAB — HEPATIC FUNCTION PANEL
ALT: 11 (ref 10–40)
AST: 15 (ref 14–40)

## 2020-11-16 LAB — BASIC METABOLIC PANEL
BUN: 21 (ref 4–21)
CO2: 30 — AB (ref 13–22)
Chloride: 106 (ref 99–108)
Creatinine: 1 (ref 0.6–1.3)
Glucose: 96
Potassium: 3.7 (ref 3.4–5.3)
Sodium: 142 (ref 137–147)

## 2020-11-16 LAB — LIPID PANEL
Cholesterol: 135 (ref 0–200)
HDL: 37 (ref 35–70)
LDL Cholesterol: 81
Triglycerides: 91 (ref 40–160)

## 2020-11-16 LAB — CBC AND DIFFERENTIAL
HCT: 39 — AB (ref 41–53)
Hemoglobin: 13.4 — AB (ref 13.5–17.5)
Neutrophils Absolute: 3312
Platelets: 146 — AB (ref 150–399)
WBC: 5.2

## 2020-11-16 LAB — CBC: RBC: 4.13 (ref 3.87–5.11)

## 2020-12-10 ENCOUNTER — Ambulatory Visit: Payer: Medicare Other | Admitting: Nurse Practitioner

## 2020-12-10 ENCOUNTER — Other Ambulatory Visit: Payer: Self-pay

## 2020-12-10 ENCOUNTER — Encounter: Payer: Self-pay | Admitting: Nurse Practitioner

## 2020-12-10 VITALS — BP 120/70 | HR 60 | Temp 97.2°F | Ht 67.0 in | Wt 205.0 lb

## 2020-12-10 DIAGNOSIS — I10 Essential (primary) hypertension: Secondary | ICD-10-CM | POA: Diagnosis not present

## 2020-12-10 DIAGNOSIS — L98491 Non-pressure chronic ulcer of skin of other sites limited to breakdown of skin: Secondary | ICD-10-CM | POA: Diagnosis not present

## 2020-12-10 DIAGNOSIS — R351 Nocturia: Secondary | ICD-10-CM | POA: Diagnosis not present

## 2020-12-10 NOTE — Progress Notes (Signed)
Careteam: Patient Care Team: [name], NP as PCP - General (Geriatric Medicine)  Advanced Directive information    No Known Allergies  Chief Complaint  Patient presents with  . Acute Visit    Patient complains of sore on bottom lip that won't heal. Patient has had sore since September. Patient got scratched by dog when trying to teach dog to swim. Patient has tried a lot of things over the counter. No pain. No drainage. Patient states that it's not a cold sore.      HPI: Patient is a 85 y.o. male seen in today at the [facility] for ongoing abrasion on lip  At last visit pt reports increase nocturia, with optimal blood pressure control medication was changed to hctz 25 mg daily. Blood pressure has remained stable at home and urination at night has decreased to 2 times vs 3.    Continues to have abrasion on lip- he was teaching his dog to swim in September and the dog scratch his lip and has been slow to heal. he has tried multiple OTC, very superficial. No drainage, pain, odor.   Review of Systems:  Review of Systems  Constitutional: Negative for chills, fever and weight loss.  HENT: Negative for tinnitus.   Respiratory: Negative for cough, sputum production and shortness of breath.   Cardiovascular: Negative for chest pain, palpitations and leg swelling.  Gastrointestinal: Negative for abdominal pain, constipation, diarrhea and heartburn.  Genitourinary: Negative for dysuria, frequency and urgency.  Musculoskeletal: Negative for back pain, falls, joint pain and myalgias.  Skin:       Ongoing sore to lip    Past Medical History:  Diagnosis Date  . History of colon polyps   . Hypercholesteremia    Per [facility] New Patient Packet.  . Hyperlipidemia   . Hypertension    Per [facility] New Patient Packet.  Marland Kitchen Post-nasal drip    Per [facility] Patient Packet.   Past Surgical History:  Procedure Laterality Date  . COLONOSCOPY  11/28/2016   [facility], Per [facility] New Patient  Packet.  Marland Kitchen HERNIA REPAIR     umbilical and inguinal (unsure what side) hernia  . NOSE SURGERY     [place], Per [facility] New Patient Packet.  . TONSILLECTOMY     Per [facility] New Patient Packet.   Social History:   reports that he has never smoked. He has never used smokeless tobacco. He reports current alcohol use of about 1.0 standard drink of alcohol per week. He reports that he does not use drugs.  Family History  Problem Relation Age of Onset  . Hypertension Mother   . Dementia Father   . Diabetes Daughter     Medications: Patient's Medications  New Prescriptions   No medications on file  Previous Medications   ENALAPRIL (VASOTEC) 20 MG TABLET    Take 1 tablet (20 mg total) by mouth every morning.   FLUTICASONE (FLONASE) 50 MCG/ACT NASAL SPRAY    Place 1 spray into both nostrils daily as needed for allergies or rhinitis.   GUAIFENESIN-CODEINE (ROBAFEN AC PO)    Take 5 mLs by mouth as needed. As needed for cough.   HYDROCHLOROTHIAZIDE (HYDRODIURIL) 25 MG TABLET    Take 1 tablet (25 mg total) by mouth daily.   RIVAROXABAN (XARELTO) 20 MG TABS TABLET    Take 1 tablet (20 mg total) by mouth at bedtime.   SILDENAFIL (VIAGRA) 100 MG TABLET    Take 1 tablet (  100 mg total) by mouth daily as needed for erectile dysfunction.   SIMVASTATIN (ZOCOR) 20 MG TABLET    Take 1 tablet (20 mg total) by mouth at bedtime.   UNABLE TO FIND    Saline Nose Spray As Needed.   UNDECYLENIC AC-ZN UNDECYLENATE (FUNGI-NAIL TOE & FOOT EX)    Apply topically as needed. For Toe Nail Fungus.  Modified Medications   No medications on file  Discontinued Medications   No medications on file    Physical Exam:  Vitals:   12/10/20 1413  BP: 120/70  Pulse: 60  Temp: (!) 97.2 F (36.2 C)  TempSrc: Temporal  SpO2: 95%  Weight: 205 lb (93 kg)  Height: 5\' 7"  (1.702 m)   Body mass index is 32.11 kg/m. Wt Readings from Last 3 Encounters:  12/10/20 205 lb (93 kg)  10/13/20 200 lb (90.7 kg)  03/12/20 208 lb  (94.3 kg)    Physical Exam Constitutional:      General: He is not in acute distress.    Appearance: He is well-developed and well-nourished. He is not diaphoretic.  HENT:     Head: Normocephalic and atraumatic.     Mouth/Throat:     Mouth: Oropharynx is clear and moist.     Pharynx: No oropharyngeal exudate.      Comments: Pencil easer sized abrasion noted to mid lip. Red base with scabbing along bottom edge. No swelling, drainage, odor or tenderness.  Eyes:     Extraocular Movements: EOM normal.     Conjunctiva/sclera: Conjunctivae normal.     Pupils: Pupils are equal, round, and reactive to light.  Cardiovascular:     Rate and Rhythm: Normal rate and regular rhythm.     Heart sounds: Normal heart sounds.  Pulmonary:     Effort: Pulmonary effort is normal.     Breath sounds: Normal breath sounds.  Abdominal:     General: Bowel sounds are normal.     Palpations: Abdomen is soft.  Musculoskeletal:        General: No tenderness or edema.     Cervical back: Normal range of motion and neck supple.     Right lower leg: No edema.     Left lower leg: No edema.  Skin:    General: Skin is warm and dry.  Neurological:     Mental Status: He is alert and oriented to person, place, and time.  Psychiatric:        Mood and Affect: Mood and affect normal.    Labs reviewed: Basic Metabolic Panel: Recent Labs    12/31/19 0000 06/29/20 0000 11/16/20 0000  NA 141 141 142  K 4.0 4.1 3.7  CL 106 105 106  CO2 26* 31* 30*  BUN 28* 27* 21  CREATININE 1.2 1.2 1.0  CALCIUM 8.9 9.4  --   TSH 5.42  --   --    Liver Function Tests: Recent Labs    12/31/19 0000 06/29/20 0000 11/16/20 0000  AST 13* 16 15  ALT 11 13 11   ALKPHOS 65 78  --   ALBUMIN 3.9 4.0  --    No results for input(s): LIPASE, AMYLASE in the last 8760 hours. No results for input(s): AMMONIA in the last 8760 hours. CBC: Recent Labs    12/31/19 0000 06/29/20 0000 11/16/20 0000  WBC 5.4 5.5 5.2  NEUTROABS  3,521 3,581 3,312.00  HGB 13.4* 13.6 13.4*  HCT 39* 41 39*  PLT 148* 158 146*   Lipid  Panel: Recent Labs    12/31/19 0000 11/16/20 0000  CHOL 136 135  HDL 32* 37  LDLCALC 84 81  TRIG 106 91   TSH: Recent Labs    12/31/19 0000  TSH 5.42   A1C: No results found for: HGBA1C   Assessment/Plan 1. Nonhealing skin ulcer, limited to breakdown of skin ([place]) -skin lesion/ulcer to lip that has not healed after dog scratched him when swimming.  - Ambulatory referral to Dermatology for further evaluation.  2. Essential hypertension Well controlled on hctz with dietary modification and increase in physical activity.   3. Nocturia improved due to decreasing diuretic.   Next appt: 01/07/2021, sooner if needed  Vincent Harper. [place] [phone]

## 2020-12-10 NOTE — Patient Instructions (Signed)
If you do not hear back about referral in a week please call our office [phone] and ask to speak with Lattie Haw (our referral coordinator) for an update.  If you feel like you have symptoms of being sick you can call the office and make an appt to be seen at the [facility] [place] can call [facility] at [phone] to make appt Or call janci [facility] nurse  mucinex DM by mouth twice daily with full glass of water for cough and congestion.

## 2020-12-21 ENCOUNTER — Ambulatory Visit (INDEPENDENT_AMBULATORY_CARE_PROVIDER_SITE_OTHER): Payer: Medicare Other | Admitting: Dermatology

## 2020-12-21 ENCOUNTER — Other Ambulatory Visit: Payer: Self-pay

## 2020-12-21 DIAGNOSIS — D492 Neoplasm of unspecified behavior of bone, soft tissue, and skin: Secondary | ICD-10-CM

## 2020-12-21 DIAGNOSIS — D040 Carcinoma in situ of skin of lip: Secondary | ICD-10-CM | POA: Diagnosis not present

## 2020-12-21 DIAGNOSIS — C44222 Squamous cell carcinoma of skin of right ear and external auricular canal: Secondary | ICD-10-CM | POA: Diagnosis not present

## 2020-12-21 DIAGNOSIS — L578 Other skin changes due to chronic exposure to nonionizing radiation: Secondary | ICD-10-CM | POA: Diagnosis not present

## 2020-12-21 DIAGNOSIS — L821 Other seborrheic keratosis: Secondary | ICD-10-CM

## 2020-12-21 DIAGNOSIS — C4492 Squamous cell carcinoma of skin, unspecified: Secondary | ICD-10-CM

## 2020-12-21 HISTORY — DX: Squamous cell carcinoma of skin, unspecified: C44.92

## 2020-12-21 NOTE — Patient Instructions (Signed)

## 2020-12-21 NOTE — Progress Notes (Signed)
   New Patient Visit  Subjective  Vincent Harper is a 85 y.o. male who presents for the following: ulcer? (Lower lip, 3-76months,  pt got scratched by dog and has not healed, has used otc ointments, hx of bleeding) and growth (R ear, just noticed).   The following portions of the chart were reviewed this encounter and updated as appropriate:       Review of Systems:  No other skin or systemic complaints except as noted in HPI or Assessment and Plan.  Objective  Well appearing patient in no apparent distress; mood and affect are within normal limits.  A focused examination was performed including face, ears. Relevant physical exam findings are noted in the Assessment and Plan.  Objective  Right Mid Helix: 1.0cm pink scaly nodule     Objective  central lower lip: 9.28mm superficial ulceration      Assessment & Plan  Neoplasm of skin (2) Right Mid Helix  Skin / nail biopsy Type of biopsy: tangential   Informed consent: discussed and consent obtained   Anesthesia: the lesion was anesthetized in a standard fashion   Anesthesia comment:  Area prepped with alcohol Anesthetic:  1% lidocaine w/ epinephrine 1-100,000 buffered w/ 8.4% NaHCO3 Instrument used: flexible razor blade   Hemostasis achieved with: pressure, aluminum chloride and electrodesiccation   Outcome: patient tolerated procedure well    Destruction of lesion  Destruction method: electrodesiccation and curettage   Informed consent: discussed and consent obtained   Timeout:  patient name, date of birth, surgical site, and procedure verified Curettage performed in three different directions: Yes   Electrodesiccation performed over the curetted area: Yes   Lesion length (cm):  1 Final wound size (cm):  1.3 Hemostasis achieved with:  pressure, aluminum chloride and electrodesiccation Outcome: patient tolerated procedure well with no complications   Post-procedure details: sterile dressing applied and wound care  instructions given   Dressing type: bandage   Additional details:  Mupirocin ointment and Bandaid applied.  If recurs, would need Mohs surgery    Specimen 1 - Surgical pathology Differential Diagnosis: D48.5 Hypertrophic AK r/o SCC  Check Margins: No 1.0cm pink scaly nodule EDC today  central lower lip  Skin / nail biopsy Type of biopsy: tangential   Informed consent: discussed and consent obtained   Anesthesia: the lesion was anesthetized in a standard fashion   Anesthesia comment:  Area prepped with alcohol Anesthetic:  1% lidocaine w/ epinephrine 1-100,000 buffered w/ 8.4% NaHCO3 Instrument used: flexible razor blade   Hemostasis achieved with: pressure, aluminum chloride and electrodesiccation   Outcome: patient tolerated procedure well   Post-procedure details: wound care instructions given   Post-procedure details comment:  Ointment and small bandage applied  Specimen 2 - Surgical pathology Differential Diagnosis: D48.5 R/O SCC IS/BCC  Check Margins: No 9.38mm superficial ulceration  Actinic Damage - chronic, secondary to cumulative UV radiation exposure/sun exposure over time - diffuse scaly erythematous macules with underlying dyspigmentation - Recommend daily broad spectrum sunscreen SPF 30+ to sun-exposed areas, reapply every 2 hours as needed.  - Call for new or changing lesions.  Seborrheic Keratoses - Stuck-on, waxy, tan-brown papules and plaques  - Discussed benign etiology and prognosis. - Observe - Call for any changes   Return pending bx results.   I, Vincent Harper, RMA, am acting as scribe for Vincent Patty, MD . Documentation: I have reviewed the above documentation for accuracy and completeness, and I agree with the above.  Vincent Patty MD

## 2020-12-28 ENCOUNTER — Telehealth: Payer: Self-pay

## 2020-12-28 NOTE — Telephone Encounter (Signed)
-----   Message from [name], MD sent at 12/28/2020  8:53 AM EST ----- 1. 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 SCC skin cancer- treated with EDC at time of biopsy, if recurs will need Mohs- recheck in 3-4 months. 2. SCCIS skin cancer- once biopsy site healed, will treat with cryotherapy to residual followed by topical 5FU cream.  Needs 1 month follow-up.

## 2020-12-28 NOTE — Telephone Encounter (Signed)
Left message for patient to call for biopsy results. 

## 2020-12-28 NOTE — Telephone Encounter (Signed)
Pt called for biopsy results, discussed biopsy results with pt return to the office in 1 month for LN2 on the central lower lip SCCis followed by a cream treatment, return to for recheck of treated SCC on the R mid helix in 3-4 months

## 2020-12-30 ENCOUNTER — Encounter: Payer: Self-pay | Admitting: Dermatology

## 2020-12-30 ENCOUNTER — Ambulatory Visit (INDEPENDENT_AMBULATORY_CARE_PROVIDER_SITE_OTHER): Payer: Medicare Other | Admitting: Dermatology

## 2020-12-30 ENCOUNTER — Other Ambulatory Visit: Payer: Self-pay

## 2020-12-30 DIAGNOSIS — D040 Carcinoma in situ of skin of lip: Secondary | ICD-10-CM

## 2020-12-30 DIAGNOSIS — D099 Carcinoma in situ, unspecified: Secondary | ICD-10-CM

## 2020-12-30 DIAGNOSIS — T1490XA Injury, unspecified, initial encounter: Secondary | ICD-10-CM

## 2020-12-30 NOTE — Progress Notes (Signed)
   Follow-Up Visit   Subjective  Vincent Harper is a 85 y.o. male who presents for the following: SCC edc site bleeding (R mid helix, started bleeding today, bx and edc 12/21/20) and SCC IS bx proven (Central lower lip, bx 12/21/20).   The following portions of the chart were reviewed this encounter and updated as appropriate:       Review of Systems:  No other skin or systemic complaints except as noted in HPI or Assessment and Plan.  Objective  Well appearing patient in no apparent distress; mood and affect are within normal limits.  A focused examination was performed including R ear. Relevant physical exam findings are noted in the Assessment and Plan.  Objective  Right mid helix: Hemorraghic eschar, no active bleeding, edc site healing well  Objective  Central lower lip: Healing bx site   Assessment & Plan  Traumatic injury Right mid helix  H/o profuse bleeding at [facility] site.  No active bleeding at this time  Cont gentle wound care qd Mupirocin and pressure dressing applied today. 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   I, [name], RMA, am acting as scribe for [name], MD . Documentation: I have reviewed the above documentation for accuracy and completeness, and I agree with the above.  [name] MD

## 2021-01-05 ENCOUNTER — Other Ambulatory Visit: Payer: Self-pay | Admitting: Nurse Practitioner

## 2021-01-05 DIAGNOSIS — E782 Mixed hyperlipidemia: Secondary | ICD-10-CM

## 2021-01-05 DIAGNOSIS — I10 Essential (primary) hypertension: Secondary | ICD-10-CM

## 2021-01-06 ENCOUNTER — Other Ambulatory Visit: Payer: Self-pay | Admitting: Nurse Practitioner

## 2021-01-06 DIAGNOSIS — I10 Essential (primary) hypertension: Secondary | ICD-10-CM

## 2021-01-07 ENCOUNTER — Other Ambulatory Visit: Payer: Self-pay

## 2021-01-07 ENCOUNTER — Ambulatory Visit (INDEPENDENT_AMBULATORY_CARE_PROVIDER_SITE_OTHER): Payer: Medicare Other | Admitting: Nurse Practitioner

## 2021-01-07 ENCOUNTER — Telehealth: Payer: Self-pay

## 2021-01-07 ENCOUNTER — Encounter: Payer: Self-pay | Admitting: Nurse Practitioner

## 2021-01-07 ENCOUNTER — Other Ambulatory Visit: Payer: Self-pay | Admitting: Nurse Practitioner

## 2021-01-07 DIAGNOSIS — I10 Essential (primary) hypertension: Secondary | ICD-10-CM

## 2021-01-07 DIAGNOSIS — Z0000 Encounter for general adult medical examination without abnormal findings: Secondary | ICD-10-CM | POA: Diagnosis not present

## 2021-01-07 NOTE — Telephone Encounter (Signed)
Mr. salam, micucci are scheduled for a virtual visit with your provider today.    Just as we do with appointments in the office, we must obtain your consent to participate.  Your consent will be active for this visit and any virtual visit you may have with one of our providers in the next 365 days.    If you have a MyChart account, I can also send a copy of this consent to you electronically.  All virtual visits are billed to your insurance company just like a traditional visit in the office.  As this is a virtual visit, video technology does not allow for your provider to perform a traditional examination.  This may limit your provider's ability to fully assess your condition.  If your provider identifies any concerns that need to be evaluated in person or the need to arrange testing such as labs, EKG, etc, we will make arrangements to do so.    Although advances in technology are sophisticated, we cannot ensure that it will always work on either your end or our end.  If the connection with a video visit is poor, we may have to switch to a telephone visit.  With either a video or telephone visit, we are not always able to ensure that we have a secure connection.   I need to obtain your verbal consent now.   Are you willing to proceed with your visit today?   Delores Thelen has provided verbal consent on 01/07/2021 for a virtual visit (video or telephone).   Oralia Manis, [place] 01/07/2021  8:23 AM

## 2021-01-07 NOTE — Patient Instructions (Signed)
Mr. Vincent Harper , Thank you for taking time to come for your Medicare Wellness Visit. I appreciate your ongoing commitment to your health goals. Please review the following plan we discussed and let me know if I can assist you in the future.   Screening recommendations/referrals: Colonoscopy aged out Recommended yearly ophthalmology/optometry visit for glaucoma screening and checkup Recommended yearly dental visit for hygiene and checkup  Vaccinations: Influenza vaccine up to date Pneumococcal vaccine up to date Tdap vaccine up to date Shingles vaccine up to date    Advanced directives: up to date  Conditions/risks identified: 85 years old  Next appointment: 1 year for AWV   Preventive Care 17 Years and Older, Male Preventive care refers to lifestyle choices and visits with your health care provider that can promote health and wellness. What does preventive care include?  A yearly physical exam. This is also called an annual well check.  Dental exams once or twice a year.  Routine eye exams. Ask your health care provider how often you should have your eyes checked.  Personal lifestyle choices, including:  Daily care of your teeth and gums.  Regular physical activity.  Eating a healthy diet.  Avoiding tobacco and drug use.  Limiting alcohol use.  Practicing safe sex.  Taking low doses of aspirin every day.  Taking vitamin and mineral supplements as recommended by your health care provider. What happens during an annual well check? The services and screenings done by your health care provider during your annual well check will depend on your age, overall health, lifestyle risk factors, and family history of disease. Counseling  Your health care provider may ask you questions about your:  Alcohol use.  Tobacco use.  Drug use.  Emotional well-being.  Home and relationship well-being.  Sexual activity.  Eating habits.  History of falls.  Memory and ability to  understand (cognition).  Work and work Statistician. Screening  You may have the following tests or measurements:  Height, weight, and BMI.  Blood pressure.  Lipid and cholesterol levels. These may be checked every 5 years, or more frequently if you are over 83 years old.  Skin check.  Lung cancer screening. You may have this screening every year starting at age 83 if you have a 30-pack-year history of smoking and currently smoke or have quit within the past 15 years.  Fecal occult blood test (FOBT) of the stool. You may have this test every year starting at age 83.  Flexible sigmoidoscopy or colonoscopy. You may have a sigmoidoscopy every 5 years or a colonoscopy every 10 years starting at age 83.  Prostate cancer screening. Recommendations will vary depending on your family history and other risks.  Hepatitis C blood test.  Hepatitis B blood test.  Sexually transmitted disease (STD) testing.  Diabetes screening. This is done by checking your blood sugar (glucose) after you have not eaten for a while (fasting). You may have this done every 1-3 years.  Abdominal aortic aneurysm (AAA) screening. You may need this if you are a current or former smoker.  Osteoporosis. You may be screened starting at age 83 if you are at high risk. Talk with your health care provider about your test results, treatment options, and if necessary, the need for more tests. Vaccines  Your health care provider may recommend certain vaccines, such as:  Influenza vaccine. This is recommended every year.  Tetanus, diphtheria, and acellular pertussis (Tdap, Td) vaccine. You may need a Td booster every 10 years.  Zoster  vaccine. You may need this after age 28.  Pneumococcal 13-valent conjugate (PCV13) vaccine. One dose is recommended after age 83.  Pneumococcal polysaccharide (PPSV23) vaccine. One dose is recommended after age 83. Talk to your health care provider about which screenings and vaccines you  need and how often you need them. This information is not intended to replace advice given to you by your health care provider. Make sure you discuss any questions you have with your health care provider. Document Released: 12/11/2015 Document Revised: 08/03/2016 Document Reviewed: 09/15/2015 Elsevier Interactive Patient Education  2017 [employer] Prevention in the Home Falls can cause injuries. They can happen to people of all ages. There are many things you can do to make your home safe and to help prevent falls. What can I do on the outside of my home?  Regularly fix the edges of walkways and driveways and fix any cracks.  Remove anything that might make you trip as you walk through a door, such as a raised step or threshold.  Trim any bushes or trees on the path to your home.  Use bright outdoor lighting.  Clear any walking paths of anything that might make someone trip, such as rocks or tools.  Regularly check to see if handrails are loose or broken. Make sure that both sides of any steps have handrails.  Any raised decks and porches should have guardrails on the edges.  Have any leaves, snow, or ice cleared regularly.  Use sand or salt on walking paths during winter.  Clean up any spills in your garage right away. This includes oil or grease spills. What can I do in the bathroom?  Use night lights.  Install grab bars by the toilet and in the tub and shower. Do not use towel bars as grab bars.  Use non-skid mats or decals in the tub or shower.  If you need to sit down in the shower, use a plastic, non-slip stool.  Keep the floor dry. Clean up any water that spills on the floor as soon as it happens.  Remove soap buildup in the tub or shower regularly.  Attach bath mats securely with double-sided non-slip rug tape.  Do not have throw rugs and other things on the floor that can make you trip. What can I do in the bedroom?  Use night lights.  Make sure  that you have a light by your bed that is easy to reach.  Do not use any sheets or blankets that are too big for your bed. They should not hang down onto the floor.  Have a firm chair that has side arms. You can use this for support while you get dressed.  Do not have throw rugs and other things on the floor that can make you trip. What can I do in the kitchen?  Clean up any spills right away.  Avoid walking on wet floors.  Keep items that you use a lot in easy-to-reach places.  If you need to reach something above you, use a strong step stool that has a grab bar.  Keep electrical cords out of the way.  Do not use floor polish or wax that makes floors slippery. If you must use wax, use non-skid floor wax.  Do not have throw rugs and other things on the floor that can make you trip. What can I do with my stairs?  Do not leave any items on the stairs.  Make sure that there are handrails  on both sides of the stairs and use them. Fix handrails that are broken or loose. Make sure that handrails are as long as the stairways.  Check any carpeting to make sure that it is firmly attached to the stairs. Fix any carpet that is loose or worn.  Avoid having throw rugs at the top or bottom of the stairs. If you do have throw rugs, attach them to the floor with carpet tape.  Make sure that you have a light switch at the top of the stairs and the bottom of the stairs. If you do not have them, ask someone to add them for you. What else can I do to help prevent falls?  Wear shoes that:  Do not have high heels.  Have rubber bottoms.  Are comfortable and fit you well.  Are closed at the toe. Do not wear sandals.  If you use a stepladder:  Make sure that it is fully opened. Do not climb a closed stepladder.  Make sure that both sides of the stepladder are locked into place.  Ask someone to hold it for you, if possible.  Clearly mark and make sure that you can see:  Any grab bars or  handrails.  First and last steps.  Where the edge of each step is.  Use tools that help you move around (mobility aids) if they are needed. These include:  Canes.  Walkers.  Scooters.  Crutches.  Turn on the lights when you go into a dark area. Replace any light bulbs as soon as they burn out.  Set up your furniture so you have a clear path. Avoid moving your furniture around.  If any of your floors are uneven, fix them.  If there are any pets around you, be aware of where they are.  Review your medicines with your doctor. Some medicines can make you feel dizzy. This can increase your chance of falling. Ask your doctor what other things that you can do to help prevent falls. This information is not intended to replace advice given to you by your health care provider. Make sure you discuss any questions you have with your health care provider. Document Released: 09/10/2009 Document Revised: 04/21/2016 Document Reviewed: 12/19/2014 Elsevier Interactive Patient Education  2017 [employer].

## 2021-01-07 NOTE — Progress Notes (Signed)
This service is provided via telemedicine  No vital signs collected/recorded due to the encounter was a telemedicine visit.   Location of patient (ex: home, work):  Home  Patient consents to a telephone visit:  Yes see Tele note 01/07/2021  Location of the provider (ex: office, home):  [facility]  Name of any referring provider: N/A  Names of all persons participating in the telemedicine service and their role in the encounter:  Marisa Cyphers RMA, Sherrie Mustache NP, Patient  Time spent on call: 8 min

## 2021-01-07 NOTE — Progress Notes (Signed)
Subjective:   Vincent Harper is a 85 y.o. male who presents for Medicare Annual/Subsequent preventive examination.  Review of Systems     Cardiac Risk Factors include: advanced age (>7men, >25 women);obesity (BMI >30kg/m2);male gender;hypertension;dyslipidemia     Objective:    There were no vitals filed for this visit. There is no height or weight on file to calculate BMI.  Advanced Directives 01/07/2021 10/13/2020 03/12/2020 12/31/2019 12/26/2019  Does Patient Have a Medical Advance Directive? Yes Yes Yes Yes Yes  Type of Paramedic of [place];Living will [place];Living will [place];Living will [place];Living will [place];Living will  Does patient want to make changes to medical advance directive? No - Patient declined No - Patient declined No - Patient declined No - Patient declined No - Guardian declined  Copy of [place] in Chart? Yes - validated most recent copy scanned in chart (See row information) Yes - validated most recent copy scanned in chart (See row information) Yes - validated most recent copy scanned in chart (See row information) - -    Current Medications (verified) Outpatient Encounter Medications as of 01/07/2021  Medication Sig  . enalapril (VASOTEC) 20 MG tablet Take 1 tablet (20 mg total) by mouth every morning.  . fluticasone (FLONASE) 50 MCG/ACT nasal spray Place 1 spray into both nostrils daily as needed for allergies or rhinitis.  Marland Kitchen guaiFENesin-Codeine (ROBAFEN AC PO) Take 5 mLs by mouth as needed. As needed for cough.  . hydrochlorothiazide (HYDRODIURIL) 25 MG tablet Take 1 tablet (25 mg total) by mouth daily.  . rivaroxaban (XARELTO) 20 MG TABS tablet Take 1 tablet (20 mg total) by mouth at bedtime.  . sildenafil (VIAGRA) 100 MG tablet Take 1 tablet (100 mg total) by mouth daily as needed for erectile dysfunction.  . simvastatin  (ZOCOR) 20 MG tablet TAKE 1 TABLET(20 MG) BY MOUTH AT BEDTIME  . UNABLE TO FIND Saline Nose Spray As Needed.  . [DISCONTINUED] triamterene-hydrochlorothiazide (MAXZIDE-25) 37.5-25 MG tablet Take 1 tablet by mouth daily.  . Undecylenic Ac-Zn Undecylenate (FUNGI-NAIL TOE & FOOT EX) Apply topically as needed. For Toe Nail Fungus. (Patient not taking: Reported on 01/07/2021)   No facility-administered encounter medications on file as of 01/07/2021.    Allergies (verified) Patient has no known allergies.   History: Past Medical History:  Diagnosis Date  . History of colon polyps   . Hypercholesteremia    Per [facility] New Patient Packet.  . Hyperlipidemia   . Hypertension    Per [facility] New Patient Packet.  Marland Kitchen Post-nasal drip    Per [place] Patient Packet.  . 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 COLONOSCOPY  11/28/2016   [facility], Per [facility] New Patient Packet.  Marland Kitchen EAR BIOPSY     and lower lip  . HERNIA REPAIR     umbilical and inguinal (unsure what side) hernia  . NOSE SURGERY     [place], Per [facility] New Patient Packet.  . TONSILLECTOMY     Per [facility] New Patient Packet.   Family History  Problem Relation Age of Onset  . Hypertension Mother   . Dementia Father   . Diabetes Daughter    Social History   Socioeconomic History  .  Marital status: Married    Spouse name: Greco Gastelum  . Number of children: 3  . Years of education: Not on file  . Highest education level: Not on file  Occupational History  . Not on file  Tobacco Use  . Smoking status: Never Smoker  . Smokeless tobacco: Never Used  Substance and Sexual Activity  . Alcohol use: Yes    Alcohol/week: 1.0 standard drink    Types: 1 Cans of beer per week    Comment: 1 Beer Every 6 months.   . Drug use: No  . Sexual activity: Yes  Other Topics  Concern  . Not on file  Social History Narrative   Tobacco use, amount per day now: None   Past tobacco use, amount per day: None   How many years did you use tobacco: None   Alcohol use (drinks per week): 1 Beer every 6 months.   Diet:   Do you drink/eat things with caffeine: Diet Pepsi   Marital status: Married                                 What year were you married? 1964   Do you live in a house, apartment, assisted living, condo, trailer, etc.? Villa in [facility].   Is it one or more stories? 1   How many persons live in your home? 2   Do you have pets in your home?( please list) No   Highest Level of education completed: College   Current or past profession:    Do you exercise? Yes                                   Type and how often? Walk Daily.   Do you have a living will? Yes in 2020   Do you have a DNR form? No                                  If not, do you want to discuss one?   Do you have signed POA/HPOA forms? Yes in 2020                        If so, please bring to you appointment    Do you have any difficulty bathing or dressing yourself? No    Do you have difficulty preparing food or eating? No   Do you have difficulty managing your medications? No   Do you have any difficulty managing your finances? No   Do you have any difficulty affording your medications? No      Social Determinants of Radio broadcast assistant Strain: Not on file  Food Insecurity: Not on file  Transportation Needs: Not on file  Physical Activity: Not on file  Stress: Not on file  Social Connections: Not on file    Tobacco Counseling Counseling given: Not Answered   Clinical Intake:  Pre-visit preparation completed: Yes  Pain : No/denies pain     BMI - recorded: 32 Nutritional Status: BMI > 30  Obese Nutritional Risks: Non-healing wound Diabetes: No  How often do you need to have someone help you when you read instructions, pamphlets, or other written  materials from your doctor or pharmacy?: 1 -  Never  Diabetic?no         Activities of Daily Living In your present state of health, do you have any difficulty performing the following activities: 01/07/2021  Hearing? N  Vision? N  Difficulty concentrating or making decisions? N  Walking or climbing stairs? N  Dressing or bathing? N  Doing errands, shopping? N  Preparing Food and eating ? N  Using the Toilet? N  In the past six months, have you accidently leaked urine? N  Do you have problems with loss of bowel control? N  Managing your Medications? N  Managing your Finances? N  Housekeeping or managing your Housekeeping? N  Some recent data might be hidden    Patient Care Team: [name], NP as PCP - General (Geriatric Medicine)  Indicate any recent Medical Services you may have received from other than [facility] providers in the past year (date may be approximate).     Assessment:   This is a routine wellness examination for [place].  Hearing/Vision screen No exam data present  Dietary issues and exercise activities discussed: Current Exercise Habits: Home exercise routine, Type of exercise: walking;strength training/weights, Time (Minutes): 60, Frequency (Times/Week): 7, Weekly Exercise (Minutes/Week): 420  Goals    . Weight (lb) < 200 lb (90.7 kg)      Depression Screen PHQ 2/9 Scores 12/31/2019  PHQ - 2 Score 0    Fall Risk Fall Risk  01/07/2021 03/12/2020 12/31/2019 12/26/2019  Falls in the past year? 0 - 0 0  Number falls in past yr: 0 0 0 0  Injury with Fall? 0 0 0 0    FALL RISK PREVENTION PERTAINING TO THE HOME:  Any stairs in or around the home? No  If so, are there any without handrails? No  Home free of loose throw rugs in walkways, pet beds, electrical cords, etc? Yes  Adequate lighting in your home to reduce risk of falls? Yes   ASSISTIVE DEVICES UTILIZED TO PREVENT FALLS:  Life alert? No  Use of a cane, walker or w/c? No  Grab bars in the  bathroom? Yes  Shower chair or bench in shower? No  Elevated toilet seat or a handicapped toilet? No   TIMED UP AND GO:  Was the test performed? No .  Gait steady and fast without use of assistive device  Cognitive Function:     6CIT Screen 12/31/2019  What Year? 0 points  What month? 0 points  What time? 0 points  Count back from 20 0 points  Months in reverse 0 points  Repeat phrase 0 points  Total Score 0    Immunizations Immunization History  Administered Date(s) Administered  . Hepatitis A, Adult 10/28/2009  . IPV 10/28/2009  . Influenza, High Dose Seasonal PF 07/25/2017  . Influenza, Seasonal, Injecte, Preservative Fre 09/27/2006, 10/10/2007, 09/23/2008, 09/30/2009, 09/15/2010, 08/26/2011  . Influenza,inj,Quad PF,6+ Mos 09/26/2012, 08/21/2015, 09/01/2017  . Influenza-Unspecified 08/25/2014, 08/07/2015, 08/16/2016, 08/29/2016, 09/13/2018, 11/28/2018, 08/23/2019, 07/29/2020  . Moderna Sars-Covid-2 Vaccination 12/10/2019, 01/07/2020, 09/21/2020  . Pneumococcal Conjugate-13 03/13/2014  . Pneumococcal Polysaccharide-23 09/29/2005  . Td 04/05/2000  . Tdap 02/29/2012  . Typhoid Inactivated 10/28/2009  . Zoster 03/17/2008  . Zoster Recombinat (Shingrix) 05/03/2018    TDAP status: Up to date  Flu Vaccine status: Up to date  Pneumococcal vaccine status: Up to date  Covid-19 vaccine status: Completed vaccines  Qualifies for Shingles Vaccine? Yes   Zostavax completed Yes   Shingrix Completed?: Yes  Screening Tests Health Maintenance  Topic Date Due  . COVID-19 Vaccine (4 - Booster for Moderna series) 03/22/2021  . TETANUS/TDAP  02/28/2022  . INFLUENZA VACCINE  Completed  . PNA vac Low Risk Adult  Completed    Health Maintenance  There are no preventive care reminders to display for this patient.  Colorectal cancer screening: No longer required.   Lung Cancer Screening: (Low Dose CT Chest recommended if Age 56-80 years, 30 pack-year currently smoking OR have  quit w/in 15years.) does not qualify.   Additional Screening:  Hepatitis C Screening: does not qualify;   Vision Screening: Recommended annual ophthalmology exams for early detection of glaucoma and other disorders of the eye. Is the patient up to date with their annual eye exam?  Yes  Who is the provider or what is the name of the office in which the patient attends annual eye exams? [facility] If pt is not established with a provider, would they like to be referred to a provider to establish care? No .   Dental Screening: Recommended annual dental exams for proper oral hygiene  Community Resource Referral / Chronic Care Management: CRR required this visit?  No   CCM required this visit?  No      Plan:     I have personally reviewed and noted the following in the patient's chart:   . Medical and social history . Use of alcohol, tobacco or illicit drugs  . Current medications and supplements . Functional ability and status . Nutritional status . Physical activity . Advanced directives . List of other physicians . Hospitalizations, surgeries, and ER visits in previous 12 months . Vitals . Screenings to include cognitive, depression, and falls . Referrals and appointments  In addition, I have reviewed and discussed with patient certain preventive protocols, quality metrics, and best practice recommendations. A written personalized care plan for preventive services as well as general preventive health recommendations were provided to patient.     [name], NP   01/07/2021    Virtual Visit via Telephone Note  I connected with@ on 01/07/21 at  8:30 AM EST by telephone and verified that I am speaking with the correct person using two identifiers.  Location: Patient: home Provider: [facility] [place]    I discussed the limitations, risks, security and privacy concerns of performing an evaluation and management service by telephone and the availability of in  person appointments. I also discussed with the patient that there may be a patient responsible charge related to this service. The patient expressed understanding and agreed to proceed.   I discussed the assessment and treatment plan with the patient. The patient was provided an opportunity to ask questions and all were answered. The patient agreed with the plan and demonstrated an understanding of the instructions.   The patient was advised to call back or seek an in-person evaluation if the symptoms worsen or if the condition fails to improve as anticipated.  I provided 18 minutes of non-face-to-face time during this encounter.  Carlos American. Harle Battiest Avs printed and mailed

## 2021-01-11 ENCOUNTER — Telehealth: Payer: Self-pay | Admitting: *Deleted

## 2021-01-11 NOTE — Telephone Encounter (Signed)
We took him off that medication and he should be taking the plan HCTZ which is on his medication list.

## 2021-01-11 NOTE — Telephone Encounter (Signed)
Patient called and stated that he needs his Triameterene/HCTZ 37.5/25 daily refilled.  Stated that the pharmacy would not refill it because we denied it.  Medication is not in patient's medication list but patient stated that he has been taking it for years.  Patient stated that he needs it refilled, he only has 3 tablets left.  Please Advise.

## 2021-01-11 NOTE — Telephone Encounter (Signed)
Patient notified and agreed. Stated that he does have the medication on hand and will start taking them and DC the Triameterene.

## 2021-01-26 ENCOUNTER — Ambulatory Visit: Payer: PRIVATE HEALTH INSURANCE | Admitting: Dermatology

## 2021-02-10 ENCOUNTER — Encounter: Payer: Self-pay | Admitting: Dermatology

## 2021-02-10 ENCOUNTER — Other Ambulatory Visit: Payer: Self-pay

## 2021-02-10 ENCOUNTER — Ambulatory Visit (INDEPENDENT_AMBULATORY_CARE_PROVIDER_SITE_OTHER): Payer: Medicare Other | Admitting: Dermatology

## 2021-02-10 DIAGNOSIS — D040 Carcinoma in situ of skin of lip: Secondary | ICD-10-CM | POA: Diagnosis not present

## 2021-02-10 DIAGNOSIS — L570 Actinic keratosis: Secondary | ICD-10-CM | POA: Diagnosis not present

## 2021-02-10 DIAGNOSIS — C44622 Squamous cell carcinoma of skin of right upper limb, including shoulder: Secondary | ICD-10-CM | POA: Diagnosis not present

## 2021-02-10 DIAGNOSIS — L578 Other skin changes due to chronic exposure to nonionizing radiation: Secondary | ICD-10-CM

## 2021-02-10 DIAGNOSIS — D099 Carcinoma in situ, unspecified: Secondary | ICD-10-CM

## 2021-02-10 DIAGNOSIS — D485 Neoplasm of uncertain behavior of skin: Secondary | ICD-10-CM

## 2021-02-10 DIAGNOSIS — Z85828 Personal history of other malignant neoplasm of skin: Secondary | ICD-10-CM | POA: Diagnosis not present

## 2021-02-10 MED ORDER — FLUOROURACIL 5 % EX CREA: TOPICAL_CREAM | CUTANEOUS | 0 refills | Status: DC

## 2021-02-10 NOTE — Progress Notes (Signed)
Follow-Up Visit   Subjective  Vincent Harper is a 85 y.o. 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 Plan to treat with cryotherapy followed by 5FU Cream. He also has a history of SCC of the right mid helix, healed post bx/EDC. He also has a growth on his right hand that has been there for 1-2 years. He states that it started as a mosquito bite and never heals due to being hit repeatedly. ).   The following portions of the chart were reviewed this encounter and updated as appropriate:       Review of Systems:  No other skin or systemic complaints except as noted in HPI or Assessment and Plan.  Objective  Well appearing patient in no apparent distress; mood and affect are within normal limits.  A focused examination was performed including face, ears, right hand. Relevant physical exam findings are noted in the Assessment and Plan.  Objective  Central Lower Lip: 2.67mm scaly macule with surrounding pink white scar  Objective  Right Mid Helix: Healed bx/edc site, mild xerosis  Objective  Right Lower Lip Vermilion Edge: Pink scaly macule  Objective  Right Hand Dorsum: 6.11mm pink scaly papule      Assessment & Plan   Actinic Damage - Severe, chronic, not at goal, secondary to cumulative UV radiation exposure over time - diffuse scaly erythematous macules and papules with underlying dyspigmentation - Discussed Prescription "Field Treatment" for Severe, Chronic Confluent Actinic Changes with Pre-Cancerous Actinic Keratoses Field treatment involves treatment of an entire area of skin that has confluent Actinic Changes (Sun/ Ultraviolet light damage) and PreCancerous Actinic Keratoses by method of PhotoDynamic Therapy (PDT) and/or prescription Topical Chemotherapy agents such as 5-fluorouracil, 5-fluorouracil/calcipotriene, and/or imiquimod.  The purpose is to decrease the number of clinically evident and  subclinical PreCancerous lesions to prevent progression to development of skin cancer by chemically destroying early precancer changes that may or may not be visible.  It has been shown to reduce the risk of developing skin cancer in the treated area. As a result of treatment, redness, scaling, crusting, and open sores may occur during treatment course. One or more than one of these methods may be used and may have to be used several times to control, suppress and eliminate the PreCancerous changes. Discussed treatment course, expected reaction, and possible side effects. - Recommend daily broad spectrum sunscreen SPF 30+ to sun-exposed areas, reapply every 2 hours as needed.  - Staying in the shade or wearing long sleeves, sun glasses (UVA+UVB protection) and wide brim hats (4-inch brim around the entire circumference of the hat) are also recommended. - Call for new or changing lesions. 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  Cryotherapy performed today to central lower lip.  In 2-3 days, start 5FU BID x 1-2 weeks as directed dsp 40g 0Rf.  If too expensive, will send to [facility].  Prior to procedure, discussed risks of blister formation, small wound, skin dyspigmentation, or rare scar following cryotherapy.   5-fluorouracil cream is is a type of field treatment used to treat  precancers and areas of sun damage. Reviewed expected reaction including irritation and mild inflammation potentially progressing to more severe inflammation including redness, scaling, crusting and open sores/erosions.  Reviewed if too much irritation occurs, ensure application of only a thin layer and decrease frequency of use to achieve a tolerable level of inflammation. Recommend applying Vaseline ointment to open sores as needed.  Minimize sun exposure while under treatment. Recommend daily broad spectrum sunscreen SPF 30+ to sun-exposed areas, reapply every 2 hours as needed.       History of SCC (squamous cell carcinoma) of skin Right Mid Helix  Clear. Observe for recurrence. Call clinic for new or changing lesions.  Recommend regular skin exams, daily broad-spectrum spf 30+ sunscreen use, and photoprotection.     AK (actinic keratosis) Right Lower Lip Vermilion Edge  Prior to procedure, discussed risks of blister formation, small wound, skin dyspigmentation, or rare scar following cryotherapy.    Destruction of lesion - Right Lower Lip Vermilion Edge  Destruction method: cryotherapy   Informed consent: discussed and consent obtained   Lesion destroyed using liquid nitrogen: Yes   Region frozen until ice ball extended beyond lesion: Yes   Outcome: patient tolerated procedure well with no complications   Post-procedure details: wound care instructions given    Neoplasm of uncertain behavior of skin Right Hand Dorsum  Skin / nail biopsy Type of biopsy: tangential   Informed consent: discussed and consent obtained   Patient was prepped and draped in usual sterile fashion: Area prepped with alcohol. Anesthesia: the lesion was anesthetized in a standard fashion   Anesthetic:  1% lidocaine w/ epinephrine 1-100,000 buffered w/ 8.4% NaHCO3 Instrument used: flexible razor blade   Hemostasis achieved with: pressure, aluminum chloride and electrodesiccation   Outcome: patient  tolerated procedure well    Destruction of lesion  Destruction method: electrodesiccation and curettage   Informed consent: discussed and consent obtained   Timeout:  patient name, date of birth, surgical site, and procedure verified Curettage performed in three different directions: Yes   Electrodesiccation performed over the curetted area: Yes   Lesion length (cm):  0.6 Lesion width (cm):  0.6 Margin per side (cm):  0.1 Final wound size (cm):  0.8 Hemostasis achieved with:  pressure, aluminum chloride and electrodesiccation Outcome: patient tolerated procedure well with no complications   Post-procedure details: wound care instructions given   Additional details:  Mupirocin ointment and Bandaid applied    Specimen 1 - Surgical pathology Differential Diagnosis: Hypertrophic AK r/o SCC Check Margins: No 6.62mm pink scaly papule EDC today  Return in about 2 weeks (around 02/24/2021) for recheck lip.   Documentation: I have reviewed the above documentation for accuracy and completeness, and I agree with the above.  [name] MD

## 2021-02-10 NOTE — Patient Instructions (Addendum)
Cryotherapy Aftercare  . Wash gently with soap and water everyday.   . Apply Vaseline and Band-Aid daily until healed.  Wound Care Instructions  1. Cleanse wound gently with soap and water once a day then pat dry with clean gauze. Apply a thing coat of Petrolatum (petroleum jelly, "Vaseline") over the wound (unless you have an allergy to this). We recommend that you use a new, sterile tube of Vaseline. Do not pick or remove scabs. Do not remove the yellow or white "healing tissue" from the base of the wound.  2. Cover the wound with fresh, clean, nonstick gauze and secure with paper tape. You may use Band-Aids in place of gauze and tape if the would is small enough, but would recommend trimming much of the tape off as there is often too much. Sometimes Band-Aids can irritate the skin.  3. You should call the office for your biopsy report after 1 week if you have not already been contacted.  4. If you experience any problems, such as abnormal amounts of bleeding, swelling, significant bruising, significant pain, or evidence of infection, please call the office immediately.  5. FOR ADULT SURGERY PATIENTS: If you need something for pain relief you may take 1 extra strength Tylenol (acetaminophen) AND 2 Ibuprofen (200mg each) together every 4 hours as needed for pain. (do not take these if you are allergic to them or if you have a reason you should not take them.) Typically, you may only need pain medication for 1 to 3 days.     

## 2021-02-12 ENCOUNTER — Other Ambulatory Visit: Payer: Self-pay | Admitting: Nurse Practitioner

## 2021-02-12 DIAGNOSIS — I10 Essential (primary) hypertension: Secondary | ICD-10-CM

## 2021-02-13 ENCOUNTER — Encounter: Payer: Self-pay | Admitting: Nurse Practitioner

## 2021-02-15 ENCOUNTER — Telehealth: Payer: Self-pay

## 2021-02-15 NOTE — Telephone Encounter (Signed)
Called pt discussed biopsy results,  ?

## 2021-02-15 NOTE — Telephone Encounter (Signed)
-----   Message from [name], MD sent at 02/15/2021 12:35 PM EDT ----- Skin , right hand dorsum WELL DIFFERENTIATED SQUAMOUS CELL CARCINOMA, ACANTHOLYTIC (ADENOID) VARIANT  SCC skin cancer- treated with EDC at time of biopsy

## 2021-02-23 ENCOUNTER — Encounter: Payer: Self-pay | Admitting: Nurse Practitioner

## 2021-02-23 ENCOUNTER — Other Ambulatory Visit: Payer: Self-pay

## 2021-02-23 ENCOUNTER — Ambulatory Visit: Payer: Medicare Other | Admitting: Nurse Practitioner

## 2021-02-23 ENCOUNTER — Other Ambulatory Visit: Payer: Self-pay | Admitting: Nurse Practitioner

## 2021-02-23 VITALS — BP 120/80 | HR 57 | Temp 98.0°F | Ht 67.0 in | Wt 207.0 lb

## 2021-02-23 DIAGNOSIS — E782 Mixed hyperlipidemia: Secondary | ICD-10-CM

## 2021-02-23 DIAGNOSIS — I824Y1 Acute embolism and thrombosis of unspecified deep veins of right proximal lower extremity: Secondary | ICD-10-CM

## 2021-02-23 DIAGNOSIS — R351 Nocturia: Secondary | ICD-10-CM | POA: Diagnosis not present

## 2021-02-23 DIAGNOSIS — E039 Hypothyroidism, unspecified: Secondary | ICD-10-CM | POA: Diagnosis not present

## 2021-02-23 DIAGNOSIS — I10 Essential (primary) hypertension: Secondary | ICD-10-CM | POA: Diagnosis not present

## 2021-02-23 DIAGNOSIS — Z86718 Personal history of other venous thrombosis and embolism: Secondary | ICD-10-CM | POA: Diagnosis not present

## 2021-02-23 DIAGNOSIS — D099 Carcinoma in situ, unspecified: Secondary | ICD-10-CM

## 2021-02-23 NOTE — Progress Notes (Addendum)
Careteam: Patient Care Team: [name], NP as PCP - General (Geriatric Medicine)  PLACE OF SERVICE:  [facility]   Advanced Directive information Does Patient Have a Medical Advance Directive?: Yes, Type of Advance Directive: Healthcare Power of [place];Living will, Does patient want to make changes to medical advance directive?: No - Patient declined  No Known Allergies  Chief Complaint  Patient presents with  . Medical Management of Chronic Issues    5 month follow up. Patient is going to see dermatologist about lip.     HPI: Patient is a 85 y.o. male for routine follow up.  [place] in 1 month.   Following dermatologist due to squamous cell carcinoma - had to put cream on lip for 2 weeks - lasted 11 days.  He is following up with dermatology tomorrow.   htn- on hctz- blood pressure well controlled.   Hyperlipidemia- continues on zocor 20 mg daily   Nocturia-  getting up `2 times at night.   Hx of dvt- on xarelto, no signs of recurrence. No abnormal bruising or bleeding.    Review of Systems:  Review of Systems  Constitutional: Negative for chills, fever and weight loss.  HENT: Negative for tinnitus.   Respiratory: Negative for cough, sputum production and shortness of breath.   Cardiovascular: Negative for chest pain, palpitations and leg swelling.  Gastrointestinal: Negative for abdominal pain, constipation, diarrhea and heartburn.  Genitourinary: Negative for dysuria, frequency and urgency.  Musculoskeletal: Negative for back pain, falls, joint pain and myalgias.  Skin: Negative.   Neurological: Negative for dizziness and headaches.  Psychiatric/Behavioral: Negative for depression and memory loss. The patient does not have insomnia.     Past Medical History:  Diagnosis Date  . History of colon polyps   . Hypercholesteremia    Per [facility] New Patient Packet.  . Hyperlipidemia   . Hypertension    Per [facility] New Patient Packet.  Marland Kitchen Post-nasal drip     Per [facility] Patient Packet.  . 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 Squamous cell carcinoma of skin 12/21/2020   in situ, Central lower lip, needs appt for LN2 followed by 5FU  . Squamous cell carcinoma of skin 02/10/2021   right dorsum hand, EDC    Past Surgical History:  Procedure Laterality Date  . COLONOSCOPY  11/28/2016   [facility], Per [facility] New Patient Packet.  Marland Kitchen EAR BIOPSY     and lower lip  . HERNIA REPAIR     umbilical and inguinal (unsure what side) hernia  . NOSE SURGERY     [place], Per [facility] New Patient Packet.  . TONSILLECTOMY     Per [place] New Patient Packet.   Social History:   reports that he has never smoked. He has never used smokeless tobacco. He reports current alcohol use of about 1.0 standard drink of alcohol per week. He reports that he does not use drugs.  Family History  Problem Relation Age of Onset  . Hypertension Mother   . Dementia Father   . Diabetes Daughter     Medications: Patient's Medications  New Prescriptions   No medications on file  Previous Medications   ENALAPRIL (VASOTEC) 20 MG TABLET    TAKE 1 TABLET(20 MG) BY MOUTH EVERY MORNING   FLUOROURACIL (EFUDEX) 5 % CREAM    Apply to lower lip twice a day x 1-2 weeks as directed.   FLUTICASONE (FLONASE) 50 MCG/ACT NASAL  SPRAY    Place 1 spray into both nostrils daily as needed for allergies or rhinitis.   GUAIFENESIN-CODEINE (ROBAFEN AC PO)    Take 5 mLs by mouth as needed. As needed for cough.   HYDROCHLOROTHIAZIDE (HYDRODIURIL) 25 MG TABLET    Take 1 tablet (25 mg total) by mouth daily.   RIVAROXABAN (XARELTO) 20 MG TABS TABLET    Take 1 tablet (20 mg total) by mouth at bedtime.   SILDENAFIL (VIAGRA) 100 MG TABLET    Take 1 tablet (100 mg total) by mouth daily as needed for erectile dysfunction.   SIMVASTATIN (ZOCOR) 20 MG TABLET    TAKE 1 TABLET(20 MG) BY MOUTH AT BEDTIME   UNABLE TO FIND    Saline Nose Spray As Needed.   UNDECYLENIC AC-ZN UNDECYLENATE  (FUNGI-NAIL TOE & FOOT EX)    Apply topically as needed. For Toe Nail Fungus.  Modified Medications   No medications on file  Discontinued Medications   No medications on file    Physical Exam:  Vitals:   02/23/21 0833  BP: 120/80  Pulse: (!) 57  Temp: 98 F (36.7 C)  SpO2: 95%  Weight: 207 lb (93.9 kg)  Height: 5\' 7"  (1.702 m)   Body mass index is 32.42 kg/m. Wt Readings from Last 3 Encounters:  02/23/21 207 lb (93.9 kg)  12/10/20 205 lb (93 kg)  10/13/20 200 lb (90.7 kg)    Physical Exam Constitutional:      General: He is not in acute distress.    Appearance: He is well-developed. He is not diaphoretic.  HENT:     Head: Normocephalic and atraumatic.     Right Ear: Tympanic membrane, ear canal and external ear normal. There is no impacted cerumen.     Left Ear: Tympanic membrane, ear canal and external ear normal. There is no impacted cerumen.     Nose: Nose normal. No congestion.     Mouth/Throat:     Mouth: Mucous membranes are moist.     Pharynx: Oropharynx is clear.  Eyes:     Conjunctiva/sclera: Conjunctivae normal.     Pupils: Pupils are equal, round, and reactive to light.  Cardiovascular:     Rate and Rhythm: Normal rate and regular rhythm.     Heart sounds: Normal heart sounds.  Pulmonary:     Effort: Pulmonary effort is normal.     Breath sounds: Normal breath sounds.  Abdominal:     General: Bowel sounds are normal.     Palpations: Abdomen is soft.  Musculoskeletal:        General: No tenderness.     Cervical back: Normal range of motion and neck supple.  Skin:    General: Skin is warm and dry.  Neurological:     Mental Status: He is alert and oriented to person, place, and time.  Psychiatric:        Mood and Affect: Mood normal.     Labs reviewed: Basic Metabolic Panel: Recent Labs    06/29/20 0000 11/16/20 0000  NA 141 142  K 4.1 3.7  CL 105 106  CO2 31* 30*  BUN 27* 21  CREATININE 1.2 1.0  CALCIUM 9.4  --    Liver Function  Tests: Recent Labs    06/29/20 0000 11/16/20 0000  AST 16 15  ALT 13 11  ALKPHOS 78  --   ALBUMIN 4.0  --    No results for input(s): LIPASE, AMYLASE in the last 8760 hours. No  results for input(s): AMMONIA in the last 8760 hours. CBC: Recent Labs    06/29/20 0000 11/16/20 0000  WBC 5.5 5.2  NEUTROABS 3,581 3,312.00  HGB 13.6 13.4*  HCT 41 39*  PLT 158 146*   Lipid Panel: Recent Labs    11/16/20 0000  CHOL 135  HDL 37  LDLCALC 81  TRIG 91   TSH: No results for input(s): TSH in the last 8760 hours. A1C: No results found for: HGBA1C   Assessment/Plan 1. Essential hypertension Well controlled on hctz, continue dietary modification with medications.   2. Nocturia Stable. Will follow up PSA with next lab draw.   3. History of DVT (deep vein thrombosis) Stable, continues on xarelto. No signs of abnormal bruising or bleeding.   4. Acquired hypothyroidism TSH mildly elevated, not currently on medication.   5. Mixed hyperlipidemia -LDL 84 and at goal on zocor. Continue medication with dietary modifications.   6. Squamous cell carcinoma in situ -followed by dermatology, s/p fluorouracil cream  Next appt: 6 months, labs prior to appt.  Carlos American. [place], [facility] [phone]

## 2021-02-24 ENCOUNTER — Encounter: Payer: Self-pay | Admitting: Dermatology

## 2021-02-24 ENCOUNTER — Other Ambulatory Visit: Payer: Self-pay

## 2021-02-24 ENCOUNTER — Ambulatory Visit (INDEPENDENT_AMBULATORY_CARE_PROVIDER_SITE_OTHER): Payer: Medicare Other | Admitting: Dermatology

## 2021-02-24 DIAGNOSIS — Z85828 Personal history of other malignant neoplasm of skin: Secondary | ICD-10-CM

## 2021-02-24 DIAGNOSIS — L244 Irritant contact dermatitis due to drugs in contact with skin: Secondary | ICD-10-CM

## 2021-02-24 DIAGNOSIS — Z86007 Personal history of in-situ neoplasm of skin: Secondary | ICD-10-CM | POA: Diagnosis not present

## 2021-02-24 NOTE — Progress Notes (Signed)
   Follow-Up Visit   Subjective  Vincent Harper is a 85 y.o. male who presents for the following: Follow-up (2 week recheck. Lower lip. Hx of SCC. Tx with LN2 followed by Efudex. Used as directed for 11 days. Reports stopping medication a few days ago. Inflamed, crusted, painful. ) and Skin Cancer (Hx of SCC. Right dorsal hand. Tx with [facility] 02/10/2021. Healing well per patient. ).    The following portions of the chart were reviewed this encounter and updated as appropriate:      Review of Systems: No other skin or systemic complaints except as noted in HPI or Assessment and Plan.  Objective  Well appearing patient in no apparent distress; mood and affect are within normal limits.  A focused examination was performed including face, lips, right hand, ears. Relevant physical exam findings are noted in the Assessment and Plan.  Objective  Right Mid Helix: Well healed scar with no evidence of recurrence   Objective  Right Dorsal Hand: Healing EDC site.  Central crust  Objective  Lower lip:    Diffuse yellow fibrinous exudate at lower lip  Images    Assessment & Plan  History of squamous cell carcinoma in situ (SCCIS) of skin Right Mid Helix  Clear. Observe for recurrence. Call clinic for new or changing lesions.  Recommend regular skin exams, daily broad-spectrum spf 30+ sunscreen use, and photoprotection.     Staying in the shade or wearing long sleeves, sun glasses (UVA+UVB protection) and wide brim hats (4-inch brim around the entire circumference of the hat) are also recommended for sun protection.    History of SCC (squamous cell carcinoma) of skin Right Dorsal Hand  Healing well.  Continue wound care.  Observe for recurrence  Recommend daily broad spectrum sunscreen SPF 30+ to sun-exposed areas, reapply every 2 hours as needed. Call for new or changing lesions.  Staying in the shade or wearing long sleeves, sun glasses (UVA+UVB protection) and wide brim hats (4-inch brim  around the entire circumference of the hat) are also recommended for sun protection.     Irritant contact dermatitis due to drug in contact with skin Lower lip  Secondary to 5FU Treatment of SCCIS lower lip, and Aks lower lip. Patient stopped 5FU few days ago- now healing, but still very irritated and raw.  Apply Vaseline Jelly several times daily. Add extra QHS.   Call or contact through My Chart in 1 week if not improving or if worsening, since pt going on trip to [place] in 10 days, after that going to [place]  Recommend daily broad spectrum sunscreen SPF 30+ to sun-exposed areas, reapply every 2 hours as needed. Call for new or changing lesions.  Staying in the shade or wearing long sleeves, sun glasses (UVA+UVB protection) and wide brim hats (4-inch brim around the entire circumference of the hat) are also recommended for sun protection.    Return in about 4 months (around 06/26/2021) for [facility] recheck, lip recheck.   I, Emelia Salisbury, CMA, am acting as scribe for [name], MD.  Documentation: I have reviewed the above documentation for accuracy and completeness, and I agree with the above.  [name] MD

## 2021-02-24 NOTE — Patient Instructions (Addendum)
Continue Vaseline Jelly to lips several times daily.   Recommend daily broad spectrum sunscreen SPF 30+ to sun-exposed areas, reapply every 2 hours as needed. Call for new or changing lesions.  Staying in the shade or wearing long sleeves, sun glasses (UVA+UVB protection) and wide brim hats (4-inch brim around the entire circumference of the hat) are also recommended for sun protection. Use lip balm with 30 SPF.  Call or contact through My Chart in 1 week if not improving or if worsening.   If you have any questions or concerns for your doctor, please call our main line at [phone] and press option 4 to reach your doctor's medical assistant. If no one answers, please leave a voicemail as directed and we will return your call as soon as possible. Messages left after 4 pm will be answered the following business day.   You may also send [place] a message via [place]. We typically respond to MyChart messages within 1-2 business days.  For prescription refills, please ask your pharmacy to contact our office. Our fax number is [phone].  If you have an urgent issue when the clinic is closed that cannot wait until the next business day, you can page your doctor at the number below.    Please note that while we do our best to be available for urgent issues outside of office hours, we are not available 24/7.   If you have an urgent issue and are unable to reach [place], you may choose to seek medical care at your doctor's office, retail clinic, urgent care center, or emergency room.  If you have a medical emergency, please immediately call 911 or go to the emergency department.  Pager Numbers  - Dr. Nehemiah Massed: [phone]  - Dr. Laurence Ferrari: [phone]  - Dr. Nicole Kindred: [phone]  In the event of inclement weather, please call our main line at [phone] for an update on the status of any delays or closures.  Dermatology Medication Tips: Please keep the boxes that topical medications come in in  order to help keep track of the instructions about where and how to use these. Pharmacies typically print the medication instructions only on the boxes and not directly on the medication tubes.   If your medication is too expensive, please contact our office at [phone] option 4 or send [place] a message through [place].   We are unable to tell what your co-pay for medications will be in advance as this is different depending on your insurance coverage. However, we may be able to find a substitute medication at lower cost or fill out paperwork to get insurance to cover a needed medication.   If a prior authorization is required to get your medication covered by your insurance company, please allow [place] 1-2 business days to complete this process.  Drug prices often vary depending on where the prescription is filled and some pharmacies may offer cheaper prices.  The website www.goodrx.com contains coupons for medications through different pharmacies. The prices here do not account for what the cost may be with help from insurance (it may be cheaper with your insurance), but the website can give you the price if you did not use any insurance.  - You can print the associated coupon and take it with your prescription to the pharmacy.  - You may also stop by our office during regular business hours and pick up a GoodRx coupon card.  - If you need your prescription sent electronically to a different pharmacy, notify our  office through [facility] or by phone at [phone] option 4.

## 2021-03-01 ENCOUNTER — Ambulatory Visit: Payer: PRIVATE HEALTH INSURANCE | Admitting: Dermatology

## 2021-03-09 ENCOUNTER — Ambulatory Visit: Payer: Self-pay | Admitting: Nurse Practitioner

## 2021-05-09 ENCOUNTER — Other Ambulatory Visit: Payer: Self-pay | Admitting: Nurse Practitioner

## 2021-05-09 DIAGNOSIS — E782 Mixed hyperlipidemia: Secondary | ICD-10-CM

## 2021-06-21 ENCOUNTER — Encounter: Payer: Self-pay | Admitting: Family

## 2021-06-21 ENCOUNTER — Ambulatory Visit (INDEPENDENT_AMBULATORY_CARE_PROVIDER_SITE_OTHER): Payer: Medicare Other | Admitting: Family

## 2021-06-21 ENCOUNTER — Other Ambulatory Visit: Payer: Self-pay

## 2021-06-21 ENCOUNTER — Ambulatory Visit: Payer: PRIVATE HEALTH INSURANCE | Admitting: Dermatology

## 2021-06-21 VITALS — BP 100/60 | HR 68 | Temp 98.0°F | Resp 16 | Ht 67.0 in | Wt 200.2 lb

## 2021-06-21 DIAGNOSIS — R059 Cough, unspecified: Secondary | ICD-10-CM | POA: Diagnosis not present

## 2021-06-21 DIAGNOSIS — H1032 Unspecified acute conjunctivitis, left eye: Secondary | ICD-10-CM | POA: Diagnosis not present

## 2021-06-21 LAB — CBC WITH DIFFERENTIAL/PLATELET
Absolute Monocytes: 7328 cells/uL — ABNORMAL HIGH (ref 200–950)
Basophils Absolute: 25 cells/uL (ref 0–200)
Basophils Relative: 0.2 %
Eosinophils Absolute: 174 cells/uL (ref 15–500)
Eosinophils Relative: 1.4 %
HCT: 32.7 % — ABNORMAL LOW (ref 38.5–50.0)
Hemoglobin: 11 g/dL — ABNORMAL LOW (ref 13.2–17.1)
Lymphs Abs: 1042 cells/uL (ref 850–3900)
MCH: 32.1 pg (ref 27.0–33.0)
MCHC: 33.6 g/dL (ref 32.0–36.0)
MCV: 95.3 fL (ref 80.0–100.0)
MPV: 11.1 fL (ref 7.5–12.5)
Monocytes Relative: 59.1 %
Neutro Abs: 3832 cells/uL (ref 1500–7800)
Neutrophils Relative %: 30.9 %
Platelets: 153 10*3/uL (ref 140–400)
RBC: 3.43 10*6/uL — ABNORMAL LOW (ref 4.20–5.80)
RDW: 13.7 % (ref 11.0–15.0)
Total Lymphocyte: 8.4 %
WBC: 12.4 10*3/uL — ABNORMAL HIGH (ref 3.8–10.8)

## 2021-06-21 MED ORDER — GENTAMICIN SULFATE 0.3 % OP SOLN: 2.0000 [drp] | Freq: Three times a day (TID) | OPHTHALMIC | 0 refills | Status: AC

## 2021-06-21 MED ORDER — DOXYCYCLINE HYCLATE 100 MG PO TABS: 100.0000 mg | ORAL_TABLET | Freq: Two times a day (BID) | ORAL | 0 refills | Status: AC

## 2021-06-21 NOTE — Patient Instructions (Signed)
- wash both eyes with baby shampoo prior to applying eye drops to both eyes.   Notify provider if symptoms worsen or fail to improve  Bacterial Conjunctivitis, Adult Bacterial conjunctivitis is an infection of the clear membrane that covers the white part of your eye and the inner surface of your eyelid (conjunctiva). When the blood vessels in your conjunctiva become inflamed, your eye becomes red or pink, and it will probably feel itchy. Bacterial conjunctivitis spreads very easily from person to person (is contagious). It also spreads easily from one eye to the other eye. What are the causes? This condition is caused by bacteria. You may get the infection if you come into close contact with: A person who is infected with the bacteria. Items that are contaminated with the bacteria, such as a face towel, contact lens solution, or eye makeup. What increases the risk? You are more likely to develop this condition if you: Are exposed to other people who have the infection. Wear contact lenses. Have a sinus infection. Have had a recent eye injury or surgery. Have a weak body defense system (immune system). Have a medical condition that causes dry eyes. What are the signs or symptoms? Symptoms of this condition include: Thick, yellowish discharge from the eye. This may turn into a crust on the eyelid overnight and cause your eyelids to stick together. Tearing or watery eyes. Itchy eyes. Burning feeling in your eyes. Eye redness. Swollen eyelids. Blurred vision. How is this diagnosed? This condition is diagnosed based on your symptoms and medical history. Your health care provider may also take a sample of discharge from your eye to findthe cause of your infection. This is rarely done. How is this treated? This condition may be treated with: Antibiotic eye drops or ointment to clear the infection more quickly and prevent the spread of infection to others. Oral antibiotic medicines to treat  infections that do not respond to drops or ointments or that last longer than 10 days. Cool, wet cloths (cool compresses) placed on the eyes. Artificial tears applied 2-6 times a day. Follow these instructions at home: Medicines Take or apply your antibiotic medicine as told by your health care provider. Do not stop taking or applying the antibiotic even if you start to feel better. Take or apply over-the-counter and prescription medicines only as told by your health care provider. Be very careful to avoid touching the edge of your eyelid with the eye-drop bottle or the ointment tube when you apply medicines to the affected eye. This will keep you from spreading the infection to your other eye or to other people. Managing discomfort Gently wipe away any drainage from your eye with a warm, wet washcloth or a cotton ball. Apply a clean, cool compress to your eye for 10-20 minutes, 3-4 times a day. General instructions Do not wear contact lenses until the inflammation is gone and your health care provider says it is safe to wear them again. Ask your health care provider how to sterilize or replace your contact lenses before you use them again. Wear glasses until you can resume wearing contact lenses. Avoid wearing eye makeup until the inflammation is gone. Throw away any old eye cosmetics that may be contaminated. Change or wash your pillowcase every day. Do not share towels or washcloths. This may spread the infection. Wash your hands often with soap and water. Use paper towels to dry your hands. Avoid touching or rubbing your eyes. Do not drive or use heavy machinery  if your vision is blurred. Contact a health care provider if: You have a fever. Your symptoms do not get better after 10 days. Get help right away if you have: A fever and your symptoms suddenly get worse. Severe pain when you move your eye. Facial pain, redness, or swelling. Sudden loss of vision. Summary Bacterial  conjunctivitis is an infection of the clear membrane that covers the white part of your eye and the inner surface of your eyelid (conjunctiva). Bacterial conjunctivitis spreads very easily from person to person (is contagious). Wash your hands often with soap and water. Use paper towels to dry your hands. Take or apply your antibiotic medicine as told by your health care provider. Do not stop taking or applying the antibiotic even if you start to feel better. Contact a health care provider if you have a fever or your symptoms do not get better after 10 days. This information is not intended to replace advice given to you by your health care provider. Make sure you discuss any questions you have with your healthcare provider. Document Revised: 03/05/2019 Document Reviewed: 06/20/2018 Elsevier Patient Education  [facility] [facility].

## 2021-06-21 NOTE — Progress Notes (Signed)
Provider: Valoree Agent FNP-C  [name], NP  Patient Care Team: [name], NP as PCP - General (Geriatric Medicine)  Extended Emergency Contact Information Primary Emergency Contact: [facility] Address: [address]          [place], [place] [address] Vincent Harper Phone: [phone] Relation: Spouse Secondary Emergency Contact: Cordone,KATHRYN Address: [address]          [place], [place] [id number] Home Phone: [phone] Relation: None  Code Status: Full Code  Goals of care: Advanced Directive information Advanced Directives 06/21/2021  Does Patient Have a Medical Advance Directive? Yes  Type of Paramedic of [place];Living will  Does patient want to make changes to medical advance directive? No - Patient declined  Copy of [place] in Chart? Yes - validated most recent copy scanned in chart (See row information)     Chief Complaint  Patient presents with   Acute Visit    Complains of eye infection for 1 month and Fever 3-4 Days.     HPI:  Pt is a 85 y.o. male seen today for an acute visit for evaluation of bilateral eye redness.He is here with Daughter.States drove by himself from [place] state one week to [place].Has a second summer House in [place].Wife flew on a Airplane. Has had fever and chills and cough x 3-4 days.Temp 99.7 He denies any contact with sick person with COVID-19 though stopped and slept in [place] on his way from [place].     Past Medical History:  Diagnosis Date   History of colon polyps    Hypercholesteremia    Per [facility] New Patient Packet.   Hyperlipidemia    Hypertension    Per [facility] New Patient Packet.   Post-nasal drip    Per [place] Patient Packet.   Squamous cell carcinoma of skin 12/21/2020   Right mid helix, EDC   Squamous cell carcinoma of skin 12/21/2020   in situ, Central lower lip, needs appt for LN2 followed by 5FU   Squamous cell carcinoma of skin  02/10/2021   right dorsum hand, EDC    Past Surgical History:  Procedure Laterality Date   COLONOSCOPY  11/28/2016   [facility], Per [facility] New Patient Packet.   EAR BIOPSY     and lower lip   HERNIA REPAIR     umbilical and inguinal (unsure what side) hernia   NOSE SURGERY     [place], Per [facility] New Patient Packet.   TONSILLECTOMY     Per [facility] New Patient Packet.    No Known Allergies  Outpatient Encounter Medications as of 06/21/2021  Medication Sig   enalapril (VASOTEC) 20 MG tablet TAKE 1 TABLET(20 MG) BY MOUTH EVERY MORNING   fluorouracil (EFUDEX) 5 % cream Apply to lower lip twice a day x 1-2 weeks as directed.   fluticasone (FLONASE) 50 MCG/ACT nasal spray Place 1 spray into both nostrils daily as needed for allergies or rhinitis.   guaiFENesin-Codeine (ROBAFEN AC PO) Take 5 mLs by mouth as needed. As needed for cough.   hydrochlorothiazide (HYDRODIURIL) 25 MG tablet Take 1 tablet (25 mg total) by mouth daily.   Oxytetracycline-Polymyxin B (TERRAMYCIN/POLYMYXIN B SULFATE OP) Place 2 drops into both eyes every 4 (four) hours.   sildenafil (VIAGRA) 100 MG tablet Take 1 tablet (100 mg total) by mouth daily as needed for erectile dysfunction.   simvastatin (ZOCOR) 20 MG tablet TAKE 1 TABLET(20 MG) BY MOUTH AT BEDTIME  UNABLE TO FIND Saline Nose Spray As Needed.   Undecylenic Ac-Zn Undecylenate (FUNGI-NAIL TOE & FOOT EX) Apply topically as needed. For Toe Nail Fungus.   XARELTO 20 MG TABS tablet TAKE 1 TABLET(20 MG) BY MOUTH AT BEDTIME   No facility-administered encounter medications on file as of 06/21/2021.    Review of Systems  Constitutional:  Positive for chills, fatigue and fever. Negative for appetite change.  HENT:  Positive for postnasal drip. Negative for congestion, rhinorrhea, sinus pressure, sinus pain, sneezing and sore throat.   Eyes:  Positive for discharge and redness. Negative for pain and itching.  Respiratory:  Positive for cough. Negative for chest tightness,  shortness of breath and wheezing.   Cardiovascular:  Negative for chest pain, palpitations and leg swelling.  Gastrointestinal:  Negative for abdominal distention, abdominal pain, constipation, diarrhea, nausea and vomiting.  Musculoskeletal:  Negative for gait problem and myalgias.  Skin:  Negative for color change, pallor and rash.  Neurological:  Negative for dizziness, speech difficulty, light-headedness and headaches.   Immunization History  Administered Date(s) Administered   Hepatitis A, Adult 10/28/2009   IPV 10/28/2009   Influenza, High Dose Seasonal PF 07/25/2017   Influenza, Seasonal, Injecte, Preservative Fre 09/27/2006, 10/10/2007, 09/23/2008, 09/30/2009, 09/15/2010, 08/26/2011   Influenza,inj,Quad PF,6+ Mos 09/26/2012, 08/21/2015, 09/01/2017   Influenza-Unspecified 08/25/2014, 08/07/2015, 08/16/2016, 08/29/2016, 09/13/2018, 11/28/2018, 08/23/2019, 07/29/2020   Moderna Sars-Covid-2 Vaccination 12/10/2019, 01/07/2020, 09/21/2020, 04/05/2021   Pneumococcal Conjugate-13 03/13/2014   Pneumococcal Polysaccharide-23 09/29/2005   Td 04/05/2000   Tdap 02/29/2012   Typhoid Inactivated 10/28/2009   Zoster Recombinat (Shingrix) 05/03/2018   Zoster, Live 03/17/2008   Pertinent  Health Maintenance Due  Topic Date Due   INFLUENZA VACCINE  06/28/2021   PNA vac Low Risk Adult  Completed   Fall Risk  06/21/2021 01/07/2021 03/12/2020 12/31/2019 12/26/2019  Falls in the past year? 0 0 - 0 0  Number falls in past yr: 0 0 0 0 0  Injury with Fall? 0 0 0 0 0  Risk for fall due to : No Fall Risks - - - -  Follow up Falls evaluation completed - - - -   Functional Status Survey:    Vitals:   06/21/21 1446  BP: 100/60  Pulse: 68  Resp: 16  Temp: 98 F (36.7 C)  SpO2: 94%  Weight: 200 lb 3.2 oz (90.8 kg)  Height: '5\' 7"'$  (1.702 m)   Body mass index is 31.36 kg/m. Physical Exam Vitals reviewed.  Constitutional:      General: He is not in acute distress.    Appearance: Normal  appearance. He is obese. He is not ill-appearing or diaphoretic.  HENT:     Head: Normocephalic.     Right Ear: Tympanic membrane, ear canal and external ear normal. There is no impacted cerumen.     Left Ear: Tympanic membrane, ear canal and external ear normal. There is no impacted cerumen.     Nose: Nose normal. No congestion or rhinorrhea.     Mouth/Throat:     Mouth: Mucous membranes are moist.     Pharynx: Oropharynx is clear. No oropharyngeal exudate or posterior oropharyngeal erythema.  Eyes:     General: No scleral icterus.       Right eye: Discharge present.        Left eye: Discharge present.    Extraocular Movements: Extraocular movements intact.     Conjunctiva/sclera:     Right eye: Right conjunctiva is injected.     Left  eye: Left conjunctiva is injected.     Pupils: Pupils are equal, round, and reactive to light.  Cardiovascular:     Rate and Rhythm: Normal rate and regular rhythm.     Pulses: Normal pulses.     Heart sounds: Normal heart sounds. No murmur heard.   No friction rub. No gallop.  Pulmonary:     Effort: Pulmonary effort is normal. No respiratory distress.     Breath sounds: No wheezing or rhonchi.     Comments: Right lower lobe coarse breath sounds noted Chest:     Chest wall: No tenderness.  Abdominal:     General: Bowel sounds are normal. There is no distension.     Palpations: Abdomen is soft. There is no mass.     Tenderness: There is no abdominal tenderness. There is no right CVA tenderness, left CVA tenderness, guarding or rebound.  Musculoskeletal:        General: No swelling or tenderness. Normal range of motion.     Right lower leg: No edema.     Left lower leg: No edema.  Skin:    General: Skin is warm and dry.     Coloration: Skin is not pale.     Findings: No bruising, erythema, lesion or rash.  Neurological:     Mental Status: He is alert and oriented to person, place, and time.     Cranial Nerves: No cranial nerve deficit.      Sensory: No sensory deficit.     Motor: No weakness.     Coordination: Coordination normal.     Gait: Gait normal.  Psychiatric:        Mood and Affect: Mood normal.        Speech: Speech normal.        Behavior: Behavior normal.        Thought Content: Thought content normal.        Judgment: Judgment normal.    Labs reviewed: Recent Labs    06/29/20 0000 11/16/20 0000  NA 141 142  K 4.1 3.7  CL 105 106  CO2 31* 30*  BUN 27* 21  CREATININE 1.2 1.0  CALCIUM 9.4  --    Recent Labs    06/29/20 0000 11/16/20 0000  AST 16 15  ALT 13 11  ALKPHOS 78  --   ALBUMIN 4.0  --    Recent Labs    06/29/20 0000 11/16/20 0000  WBC 5.5 5.2  NEUTROABS 3,581 3,312.00  HGB 13.6 13.4*  HCT 41 39*  PLT 158 146*   Lab Results  Component Value Date   TSH 5.42 12/31/2019   TSH 5.42 12/31/2019   No results found for: HGBA1C Lab Results  Component Value Date   CHOL 135 11/16/2020   HDL 37 11/16/2020   LDLCALC 81 11/16/2020   TRIG 91 11/16/2020    Significant Diagnostic Results in last 30 days:  No results found.  Assessment/Plan 1. Cough Temp 98  - coarse breath sounds on right lower lobe.will treat clinically with Doxycycline - doxycycline (VIBRA-TABS) 100 MG tablet; Take 1 tablet (100 mg total) by mouth 2 (two) times daily for 10 days.  Dispense: 20 tablet; Refill: 0 - CBC with Differential/Platelet  2. Acute bacterial conjunctivitis of left eye Bilateral conjunctival injected with yellow crusty drainage noted on eyelashes.  Advised to wash both eyelashes  with baby shampoo and warm water prior to applying eye drops to both eyes. - Notify provider if symptoms worsen or  fail to improve  - gentamicin (GARAMYCIN) 0.3 % ophthalmic solution; Place 2 drops into both eyes 3 (three) times daily for 7 days.  Dispense: 2.1 mL; Refill: 0  Family/ staff Communication: Reviewed plan of care with patient and daughter verbalized understanding   Labs/tests ordered: None   Next  Appointment: As needed if symptoms worsen or fail to improve    Sandrea Hughs, NP

## 2021-06-22 ENCOUNTER — Ambulatory Visit: Payer: Medicare Other | Admitting: Nurse Practitioner

## 2021-06-22 VITALS — BP 100/60 | HR 64 | Temp 99.1°F | Ht 67.0 in | Wt 200.0 lb

## 2021-06-22 DIAGNOSIS — D649 Anemia, unspecified: Secondary | ICD-10-CM

## 2021-06-22 DIAGNOSIS — M5430 Sciatica, unspecified side: Secondary | ICD-10-CM

## 2021-06-22 DIAGNOSIS — H1032 Unspecified acute conjunctivitis, left eye: Secondary | ICD-10-CM

## 2021-06-22 DIAGNOSIS — J40 Bronchitis, not specified as acute or chronic: Secondary | ICD-10-CM

## 2021-06-22 DIAGNOSIS — I10 Essential (primary) hypertension: Secondary | ICD-10-CM

## 2021-06-22 DIAGNOSIS — M549 Dorsalgia, unspecified: Secondary | ICD-10-CM

## 2021-06-22 DIAGNOSIS — Z86718 Personal history of other venous thrombosis and embolism: Secondary | ICD-10-CM

## 2021-06-22 NOTE — Progress Notes (Signed)
Careteam: Patient Care Team: [name], NP as PCP - General (Geriatric Medicine)  Advanced Directive information    No Known Allergies  Chief Complaint  Patient presents with   Acute Visit    Patient following up on eye infection. Patient has also been having leg and back pain for about a month. Pain in right leg. He has been taking Tylenol for the pain.      HPI: Patient is a 85 y.o. male seen in today at [facility] for follow up.  No shortness of breath but having cough and congestion.  Took 2 COVID test, both negative.   Went to healthcare provider in [place] and got drops for conjunctivitis. Got refill and started drops but never finished because never improved. Saw Dinah yesterday who prescribed different drops. Thoughts that he is reinfecting eye due to reusing same towel.   Hgb now at 11 down from 13 in December. No bleeding or blood in stool that he is aware of.   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  He went to a chiropractor which improved pain but still noticed weakness and pain in right leg.  Pain has been going on for a least a month. Things are better since he is back at [facility].  He had significant back pain 10 years ago.    Review of Systems:  Review of Systems  Constitutional:  Positive for chills and fever. Negative for weight loss.  HENT:  Negative for tinnitus.   Respiratory:  Positive for cough and sputum production. Negative for shortness of breath and wheezing.   Cardiovascular:  Negative for chest pain, palpitations and leg swelling.  Gastrointestinal:  Negative for abdominal pain, constipation, diarrhea and heartburn.  Genitourinary:  Negative for dysuria, frequency and urgency.  Musculoskeletal:  Positive for back pain and myalgias. Negative for falls and joint pain.  Skin: Negative.   Neurological:  Positive for focal weakness and weakness. Negative for dizziness and headaches.   Past  Medical History:  Diagnosis Date   History of colon polyps    Hypercholesteremia    Per [facility] New Patient Packet.   Hyperlipidemia    Hypertension    Per [facility] New Patient Packet.   Post-nasal drip    Per [place] Patient Packet.   Squamous cell carcinoma of skin 12/21/2020   Right mid helix, EDC   Squamous cell carcinoma of skin 12/21/2020   in situ, Central lower lip, needs appt for LN2 followed by 5FU   Squamous cell carcinoma of skin 02/10/2021   right dorsum hand, EDC    Past Surgical History:  Procedure Laterality Date   COLONOSCOPY  11/28/2016   [facility], [facility] New Patient Packet.   EAR BIOPSY     and lower lip   HERNIA REPAIR     umbilical and inguinal (unsure what side) hernia   NOSE SURGERY     [place], Per [facility] New Patient Packet.   TONSILLECTOMY     Per [facility] New Patient Packet.   Social History:   reports that he has never smoked. He has never used smokeless tobacco. He reports current alcohol use of about 1.0 standard drink of alcohol per week. He reports that he does not use drugs.  Family History  Problem Relation Age of Onset   Hypertension Mother    Dementia Father    Diabetes Daughter     Medications: Patient's  Medications  New Prescriptions   No medications on file  Previous Medications   DOXYCYCLINE (VIBRA-TABS) 100 MG TABLET    Take 1 tablet (100 mg total) by mouth 2 (two) times daily for 10 days.   ENALAPRIL (VASOTEC) 20 MG TABLET    TAKE 1 TABLET(20 MG) BY MOUTH EVERY MORNING   FLUOROURACIL (EFUDEX) 5 % CREAM    Apply to lower lip twice a day x 1-2 weeks as directed.   FLUTICASONE (FLONASE) 50 MCG/ACT NASAL SPRAY    Place 1 spray into both nostrils daily as needed for allergies or rhinitis.   GENTAMICIN (GARAMYCIN) 0.3 % OPHTHALMIC SOLUTION    Place 2 drops into both eyes 3 (three) times daily for 7 days.   GUAIFENESIN-CODEINE (ROBAFEN AC PO)    Take 5 mLs by mouth as needed. As needed for cough.   HYDROCHLOROTHIAZIDE (HYDRODIURIL) 25 MG TABLET     Take 1 tablet (25 mg total) by mouth daily.   OXYTETRACYCLINE-POLYMYXIN B (TERRAMYCIN/POLYMYXIN B SULFATE OP)    Place 2 drops into both eyes every 4 (four) hours.   SILDENAFIL (VIAGRA) 100 MG TABLET    Take 1 tablet (100 mg total) by mouth daily as needed for erectile dysfunction.   SIMVASTATIN (ZOCOR) 20 MG TABLET    TAKE 1 TABLET(20 MG) BY MOUTH AT BEDTIME   UNABLE TO FIND    Saline Nose Spray As Needed.   UNDECYLENIC AC-ZN UNDECYLENATE (FUNGI-NAIL TOE & FOOT EX)    Apply topically as needed. For Toe Nail Fungus.   XARELTO 20 MG TABS TABLET    TAKE 1 TABLET(20 MG) BY MOUTH AT BEDTIME  Modified Medications   No medications on file  Discontinued Medications   No medications on file    Physical Exam:  Vitals:   06/22/21 0838  BP: 100/60  Pulse: 64  Temp: 99.1 F (37.3 C)  SpO2: 97%  Weight: 200 lb 3.2 oz (90.8 kg)  Height: '5\' 7"'$  (1.702 m)   Body mass index is 31.36 kg/m. Wt Readings from Last 3 Encounters:  06/22/21 200 lb 3.2 oz (90.8 kg)  06/21/21 200 lb 3.2 oz (90.8 kg)  02/23/21 207 lb (93.9 kg)    Physical Exam Constitutional:      General: He is not in acute distress.    Appearance: He is well-developed. He is not diaphoretic.  HENT:     Head: Normocephalic and atraumatic.     Right Ear: External ear normal.     Left Ear: External ear normal.     Mouth/Throat:     Pharynx: No oropharyngeal exudate.  Eyes:     Conjunctiva/sclera: Conjunctivae normal.     Pupils: Pupils are equal, round, and reactive to light.  Cardiovascular:     Rate and Rhythm: Normal rate and regular rhythm.  Pulmonary:     Effort: Pulmonary effort is normal.     Breath sounds: Normal breath sounds.  Abdominal:     General: Bowel sounds are normal.     Palpations: Abdomen is soft.  Musculoskeletal:        General: No tenderness.     Cervical back: Normal range of motion and neck supple.     Right lower leg: No edema.     Left lower leg: No edema.  Skin:    General: Skin is warm and  dry.  Neurological:     Mental Status: He is alert and oriented to person, place, and time.     Motor: No weakness.  Gait: Gait normal.  Psychiatric:        Mood and Affect: Mood normal.    Labs reviewed: Basic Metabolic Panel: Recent Labs    06/29/20 0000 11/16/20 0000  NA 141 142  K 4.1 3.7  CL 105 106  CO2 31* 30*  BUN 27* 21  CREATININE 1.2 1.0  CALCIUM 9.4  --    Liver Function Tests: Recent Labs    06/29/20 0000 11/16/20 0000  AST 16 15  ALT 13 11  ALKPHOS 78  --   ALBUMIN 4.0  --    No results for input(s): LIPASE, AMYLASE in the last 8760 hours. No results for input(s): AMMONIA in the last 8760 hours. CBC: Recent Labs    06/29/20 0000 11/16/20 0000 06/21/21 1532  WBC 5.5 5.2 12.4*  NEUTROABS 3,581 3,312.00 3,832  HGB 13.6 13.4* 11.0*  HCT 41 39* 32.7*  MCV  --   --  95.3  PLT 158 146* 153   Lipid Panel: Recent Labs    11/16/20 0000  CHOL 135  HDL 37  LDLCALC 81  TRIG 91   TSH: No results for input(s): TSH in the last 8760 hours. A1C: No results found for: HGBA1C   Assessment/Plan 1. History of DVT (deep vein thrombosis) Continues on xarelto, no signs of recurrence.   2. Back pain with sciatica -ongoing, improved some with chiropracter but also having weakness of right lower leg. Will refer to [facility], if needed will get further evaluation with imaging if symptoms do not improve.  - Ambulatory referral to Physical Therapy  3. Acute bacterial conjunctivitis of left eye Continues on gentamicin drops. Reviewed proper eye care and cleaning with pt.   4. Bronchitis -continues on doxycycline, wbc reviewed with pt. To take twice daily until gone.  -mucinex DM by mouth twice daily with full glass of water. -to notify if symptoms fail to improve or worsen.   5. Anemia, unspecified type No signs of blood loss however he is on xarelto due to hx of DVT. Will follow up cbc next week to ensure stability and get ifob.    Carlos American.  [place], [facility] [phone]

## 2021-06-22 NOTE — Patient Instructions (Addendum)
Mucinex DM by mouth twice daily with full glass of water for 7 days  Hold Hydrochlorothiazide if top blood pressure number <120    Deep breathing exercising.   Follow up blood work at [facility] Monday Aug 1st at 7:30 am

## 2021-06-28 ENCOUNTER — Encounter: Payer: Self-pay | Admitting: *Deleted

## 2021-06-29 LAB — BASIC METABOLIC PANEL
BUN: 24 — AB (ref 4–21)
CO2: 26 — AB (ref 13–22)
Chloride: 107 (ref 99–108)
Creatinine: 1.1 (ref 0.6–1.3)
Glucose: 81
Potassium: 3.9 (ref 3.4–5.3)
Sodium: 142 (ref 137–147)

## 2021-06-29 LAB — COMPREHENSIVE METABOLIC PANEL: Calcium: 8.5 — AB (ref 8.7–10.7)

## 2021-06-29 LAB — CBC AND DIFFERENTIAL
HCT: 35 — AB (ref 41–53)
Hemoglobin: 11 — AB (ref 13.5–17.5)
Neutrophils Absolute: 5576
Platelets: 196 (ref 150–399)
WBC: 10.6

## 2021-06-29 LAB — CBC: RBC: 3.49 — AB (ref 3.87–5.11)

## 2021-06-29 LAB — FECAL OCCULT BLOOD, GUAIAC: Fecal Occult Blood: POSITIVE

## 2021-06-30 ENCOUNTER — Telehealth: Payer: Self-pay | Admitting: Nurse Practitioner

## 2021-06-30 ENCOUNTER — Other Ambulatory Visit: Payer: Self-pay | Admitting: Nurse Practitioner

## 2021-06-30 DIAGNOSIS — R195 Other fecal abnormalities: Secondary | ICD-10-CM

## 2021-06-30 MED ORDER — PANTOPRAZOLE SODIUM 40 MG PO TBEC: 40.0000 mg | DELAYED_RELEASE_TABLET | Freq: Every day | ORAL | 3 refills | Status: DC

## 2021-06-30 MED ORDER — IRON (FERROUS SULFATE) 325 (65 FE) MG PO TABS: 325.0000 mg | ORAL_TABLET | Freq: Every day | ORAL | 55 refills | Status: AC

## 2021-06-30 NOTE — Telephone Encounter (Signed)
Discussed results with patient, patient verbalized understanding of results. Patient is in agreement with referral to GI doctor, patient does not have a preference as he has not seen a GI doctor here before. Last GI doctor was with [facility] and patient was told due to age he no longer needed to be followed by a GI doctor.   Patient states he has had nose bleeds and wonders if there is a correlation between nose bleeds and blood in stool.   Patient also questions why [name] is recommending protonix.   I placed patient on a brief hold spoke with [name]. Per [name] protonix will help reduce acid by protecting the gut in case of blood loss. [name] stated there is no correlation between nose bleeds and blood in stool. Patient verbalized understanding of response.   Patient stated he would like for [name] to know he is still waking up with eye crust and drainage. Patient has 1 day of antibiotic left and 1-2 days of eye drops left. Patient would like to know if there is a stronger eye drop or if [name] can recommend something else  Please advise

## 2021-06-30 NOTE — Telephone Encounter (Signed)
Please notify pt that lab work came back and blood count is stable (no change) but still low- stool was positive for blood. Would like for him to see a GI doctor at this time to evaluate. Do not see that he has a GI doctor on file, I will refer him- if he has a preference to let us know and I will direct the referral coordinator otherwise it will be someone close to [facility]. Recommend to start taking iron (ferrous sulfate 325 mg by mouth daily) this is over the counter and protonix 40 mg daily (rx).

## 2021-06-30 NOTE — Telephone Encounter (Signed)
Referral placed.   He is on a blood thinner so he is more at risk for bleeding that is likely why he is having nose bleeds and dry air can contribute. Unsure what the reason is for blood in stool. GI will do further testing to evaluate.   This is his 3rd course of antibiotic eye drops. Ophthalmology evaluation to make sure we are not missing something.

## 2021-06-30 NOTE — Telephone Encounter (Signed)
Spoke with patient and discussed additional response. Patient states he called his eye doctor, was told the earliest he could get in would be October 2022, and he asked that they speak with the eye doctor as he can not wait until October. Patient  is waiting to hear back from them with an appointment. Patient asked if he could speak directly with [name].  Patient aware I will send a message and have her call him tomorrow.  Patient stated that would be great and he will expect to her from her tomorrow.

## 2021-07-01 MED ORDER — ERYTHROMYCIN 5 MG/GM OP OINT: 1.0000 "application " | TOPICAL_OINTMENT | Freq: Three times a day (TID) | OPHTHALMIC | 0 refills | Status: AC

## 2021-07-01 NOTE — Telephone Encounter (Signed)
Call completed. Rx sent for erythromycin ointment, he still plans to see ophthalmologist when he can get an appt.  Discussed protonix, iron with him.  No signs of blood loss at this time.

## 2021-07-06 ENCOUNTER — Emergency Department: Payer: Medicare Other

## 2021-07-06 ENCOUNTER — Inpatient Hospital Stay
Admission: EM | Admit: 2021-07-06 | Discharge: 2021-07-10 | DRG: 814 | Disposition: A | Discharge status: Nursing Home (Includes ICF) | Payer: Medicare Other | Source: Skilled Nursing Facility | Attending: Internal Medicine | Admitting: Internal Medicine

## 2021-07-06 ENCOUNTER — Inpatient Hospital Stay: Payer: Medicare Other

## 2021-07-06 ENCOUNTER — Telehealth: Payer: Self-pay | Admitting: *Deleted

## 2021-07-06 DIAGNOSIS — D539 Nutritional anemia, unspecified: Secondary | ICD-10-CM | POA: Diagnosis present

## 2021-07-06 DIAGNOSIS — I82409 Acute embolism and thrombosis of unspecified deep veins of unspecified lower extremity: Secondary | ICD-10-CM | POA: Diagnosis not present

## 2021-07-06 DIAGNOSIS — M25552 Pain in left hip: Secondary | ICD-10-CM | POA: Diagnosis present

## 2021-07-06 DIAGNOSIS — E7800 Pure hypercholesterolemia, unspecified: Secondary | ICD-10-CM | POA: Diagnosis present

## 2021-07-06 DIAGNOSIS — R509 Fever, unspecified: Secondary | ICD-10-CM | POA: Diagnosis not present

## 2021-07-06 DIAGNOSIS — Z79899 Other long term (current) drug therapy: Secondary | ICD-10-CM

## 2021-07-06 DIAGNOSIS — J9601 Acute respiratory failure with hypoxia: Secondary | ICD-10-CM | POA: Diagnosis present

## 2021-07-06 DIAGNOSIS — Z86010 Personal history of colonic polyps: Secondary | ICD-10-CM

## 2021-07-06 DIAGNOSIS — Z8249 Family history of ischemic heart disease and other diseases of the circulatory system: Secondary | ICD-10-CM

## 2021-07-06 DIAGNOSIS — Z86718 Personal history of other venous thrombosis and embolism: Secondary | ICD-10-CM | POA: Diagnosis not present

## 2021-07-06 DIAGNOSIS — D72821 Monocytosis (symptomatic): Principal | ICD-10-CM | POA: Diagnosis present

## 2021-07-06 DIAGNOSIS — S76011A Strain of muscle, fascia and tendon of right hip, initial encounter: Secondary | ICD-10-CM | POA: Diagnosis present

## 2021-07-06 DIAGNOSIS — M25551 Pain in right hip: Secondary | ICD-10-CM | POA: Diagnosis present

## 2021-07-06 DIAGNOSIS — Z85828 Personal history of other malignant neoplasm of skin: Secondary | ICD-10-CM | POA: Diagnosis not present

## 2021-07-06 DIAGNOSIS — R791 Abnormal coagulation profile: Secondary | ICD-10-CM | POA: Diagnosis present

## 2021-07-06 DIAGNOSIS — D696 Thrombocytopenia, unspecified: Secondary | ICD-10-CM | POA: Diagnosis present

## 2021-07-06 DIAGNOSIS — Z20822 Contact with and (suspected) exposure to covid-19: Secondary | ICD-10-CM | POA: Diagnosis present

## 2021-07-06 DIAGNOSIS — I10 Essential (primary) hypertension: Secondary | ICD-10-CM | POA: Diagnosis present

## 2021-07-06 DIAGNOSIS — E039 Hypothyroidism, unspecified: Secondary | ICD-10-CM | POA: Diagnosis present

## 2021-07-06 DIAGNOSIS — Z7901 Long term (current) use of anticoagulants: Secondary | ICD-10-CM | POA: Diagnosis not present

## 2021-07-06 DIAGNOSIS — X58XXXA Exposure to other specified factors, initial encounter: Secondary | ICD-10-CM | POA: Diagnosis present

## 2021-07-06 DIAGNOSIS — M79662 Pain in left lower leg: Secondary | ICD-10-CM

## 2021-07-06 DIAGNOSIS — N179 Acute kidney failure, unspecified: Secondary | ICD-10-CM | POA: Diagnosis present

## 2021-07-06 DIAGNOSIS — R0902 Hypoxemia: Secondary | ICD-10-CM | POA: Diagnosis not present

## 2021-07-06 LAB — PATHOLOGIST SMEAR REVIEW

## 2021-07-06 LAB — CBC WITH DIFFERENTIAL/PLATELET
Abs Immature Granulocytes: 0 10*3/uL (ref 0.00–0.07)
Basophils Absolute: 0 10*3/uL (ref 0.0–0.1)
Basophils Relative: 0 %
Eosinophils Absolute: 0.7 10*3/uL — ABNORMAL HIGH (ref 0.0–0.5)
Eosinophils Relative: 4 %
HCT: 32.1 % — ABNORMAL LOW (ref 39.0–52.0)
Hemoglobin: 10.4 g/dL — ABNORMAL LOW (ref 13.0–17.0)
Lymphocytes Relative: 14 %
Lymphs Abs: 2.4 10*3/uL (ref 0.7–4.0)
MCH: 32.9 pg (ref 26.0–34.0)
MCHC: 32.4 g/dL (ref 30.0–36.0)
MCV: 101.6 fL — ABNORMAL HIGH (ref 80.0–100.0)
Monocytes Absolute: 9.4 10*3/uL — ABNORMAL HIGH (ref 0.1–1.0)
Monocytes Relative: 55 %
Neutro Abs: 4.6 10*3/uL (ref 1.7–7.7)
Neutrophils Relative %: 27 %
Platelets: 148 10*3/uL — ABNORMAL LOW (ref 150–400)
RBC: 3.16 MIL/uL — ABNORMAL LOW (ref 4.22–5.81)
RDW: 15.5 % (ref 11.5–15.5)
Smear Review: NORMAL
WBC: 17.1 10*3/uL — ABNORMAL HIGH (ref 4.0–10.5)
nRBC: 0.2 % (ref 0.0–0.2)

## 2021-07-06 LAB — COMPREHENSIVE METABOLIC PANEL
ALT: 12 U/L (ref 0–44)
AST: 16 U/L (ref 15–41)
Albumin: 2.7 g/dL — ABNORMAL LOW (ref 3.5–5.0)
Alkaline Phosphatase: 52 U/L (ref 38–126)
Anion gap: 8 (ref 5–15)
BUN: 28 mg/dL — ABNORMAL HIGH (ref 8–23)
CO2: 26 mmol/L (ref 22–32)
Calcium: 8.4 mg/dL — ABNORMAL LOW (ref 8.9–10.3)
Chloride: 103 mmol/L (ref 98–111)
Creatinine, Ser: 1.42 mg/dL — ABNORMAL HIGH (ref 0.61–1.24)
GFR, Estimated: 49 mL/min — ABNORMAL LOW (ref >60)
Glucose, Bld: 120 mg/dL — ABNORMAL HIGH (ref 70–99)
Potassium: 3.7 mmol/L (ref 3.5–5.1)
Sodium: 137 mmol/L (ref 135–145)
Total Bilirubin: 1 mg/dL (ref 0.3–1.2)
Total Protein: 8.8 g/dL — ABNORMAL HIGH (ref 6.5–8.1)

## 2021-07-06 LAB — URINALYSIS, COMPLETE (UACMP) WITH MICROSCOPIC
Bacteria, UA: NONE SEEN
Bilirubin Urine: NEGATIVE
Glucose, UA: NEGATIVE mg/dL
Ketones, ur: NEGATIVE mg/dL
Leukocytes,Ua: NEGATIVE
Nitrite: NEGATIVE
Protein, ur: 30 mg/dL — AB
Specific Gravity, Urine: 1.026 (ref 1.005–1.030)
Squamous Epithelial / HPF: NONE SEEN (ref 0–5)
pH: 5 (ref 5.0–8.0)

## 2021-07-06 LAB — FOLATE: Folate: 15.7 ng/mL (ref >5.9)

## 2021-07-06 LAB — FIBRINOGEN: Fibrinogen: 646 mg/dL — ABNORMAL HIGH (ref 210–475)

## 2021-07-06 LAB — VITAMIN B12: Vitamin B-12: 210 pg/mL (ref 180–914)

## 2021-07-06 LAB — IRON AND TIBC
Iron: 46 ug/dL (ref 45–182)
Saturation Ratios: 31 % (ref 17.9–39.5)
TIBC: 148 ug/dL — ABNORMAL LOW (ref 250–450)
UIBC: 102 ug/dL

## 2021-07-06 LAB — LACTIC ACID, PLASMA
Lactic Acid, Venous: 1 mmol/L (ref 0.5–1.9)
Lactic Acid, Venous: 1.2 mmol/L (ref 0.5–1.9)

## 2021-07-06 LAB — PROTIME-INR
INR: 2.4 — ABNORMAL HIGH (ref 0.8–1.2)
Prothrombin Time: 26.3 seconds — ABNORMAL HIGH (ref 11.4–15.2)

## 2021-07-06 LAB — URIC ACID: Uric Acid, Serum: 6.2 mg/dL (ref 3.7–8.6)

## 2021-07-06 LAB — PHOSPHORUS: Phosphorus: 2.8 mg/dL (ref 2.5–4.6)

## 2021-07-06 LAB — SEDIMENTATION RATE: Sed Rate: 106 mm/hr — ABNORMAL HIGH (ref 0–20)

## 2021-07-06 LAB — BRAIN NATRIURETIC PEPTIDE: B Natriuretic Peptide: 96.5 pg/mL (ref 0.0–100.0)

## 2021-07-06 LAB — RETICULOCYTES
Immature Retic Fract: 27.2 % — ABNORMAL HIGH (ref 2.3–15.9)
RBC.: 3.1 MIL/uL — ABNORMAL LOW (ref 4.22–5.81)
Retic Count, Absolute: 47.4 10*3/uL (ref 19.0–186.0)
Retic Ct Pct: 1.5 % (ref 0.4–3.1)

## 2021-07-06 LAB — C-REACTIVE PROTEIN: CRP: 10 mg/dL — ABNORMAL HIGH (ref <1.0)

## 2021-07-06 LAB — TSH: TSH: 4.744 u[IU]/mL — ABNORMAL HIGH (ref 0.350–4.500)

## 2021-07-06 LAB — FERRITIN: Ferritin: 424 ng/mL — ABNORMAL HIGH (ref 24–336)

## 2021-07-06 LAB — LACTATE DEHYDROGENASE: LDH: 161 U/L (ref 98–192)

## 2021-07-06 IMAGING — MR MR HIP*R* WO/W CM
7 of 9 series · 33 of 40 positions shown · IV contrast (10ml Gadavist)
Comparison: Right hip x-rays from same day.

CLINICAL DATA: Right groin pain.

EXAM:
MRI OF THE RIGHT HIP WITHOUT AND WITH CONTRAST
TECHNIQUE: Multiplanar, multisequence MR imaging was performed both before and
after administration of intravenous contrast.
CONTRAST:  10mL GADAVIST GADOBUTROL 1 MMOL/ML IV SOLN

[Series 7: T1 · coronal · 4.0mm · 0.59mm/px · 6 of 38 slices shown]
[im 1/38]
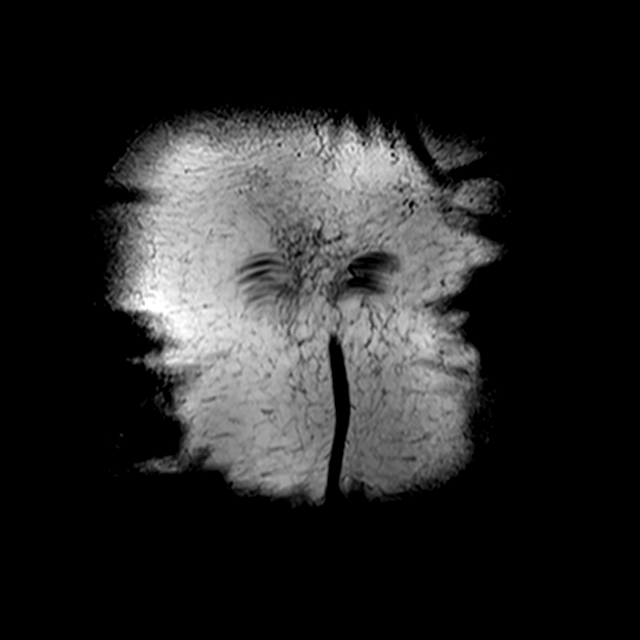
[im 8/38]
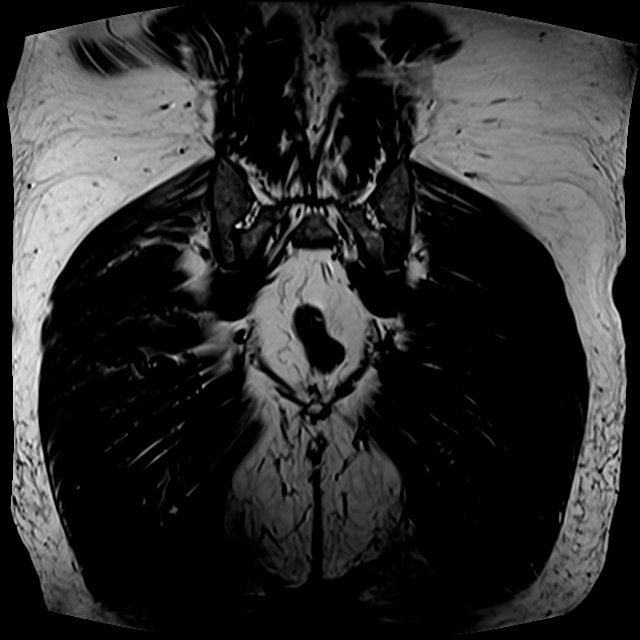
[im 15/38]
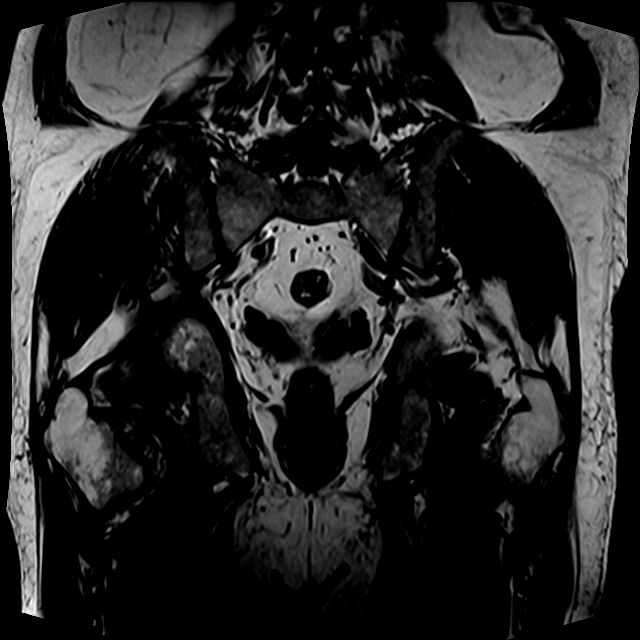
[im 23/38]
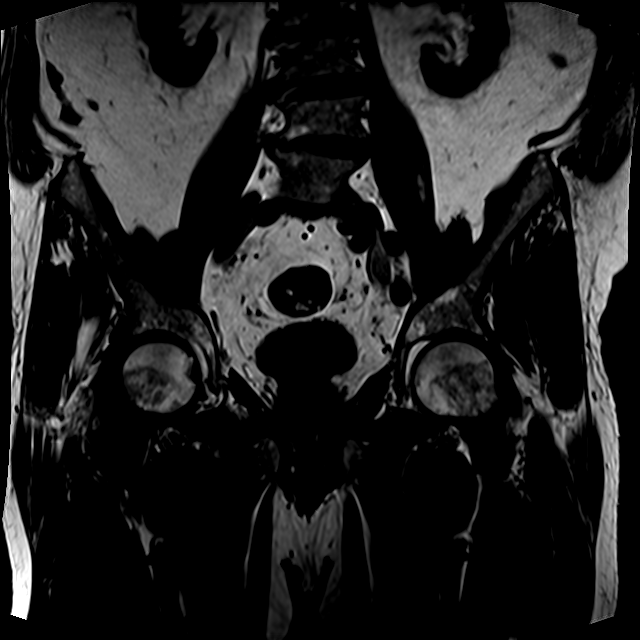
[im 30/38]
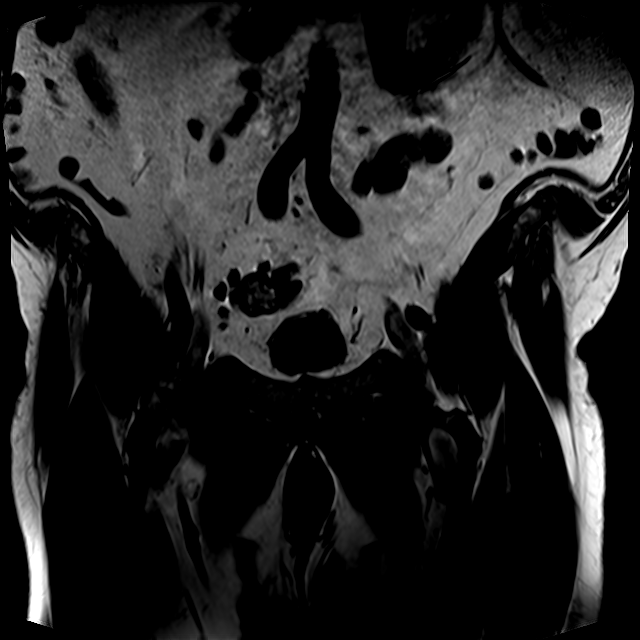
[im 38/38]
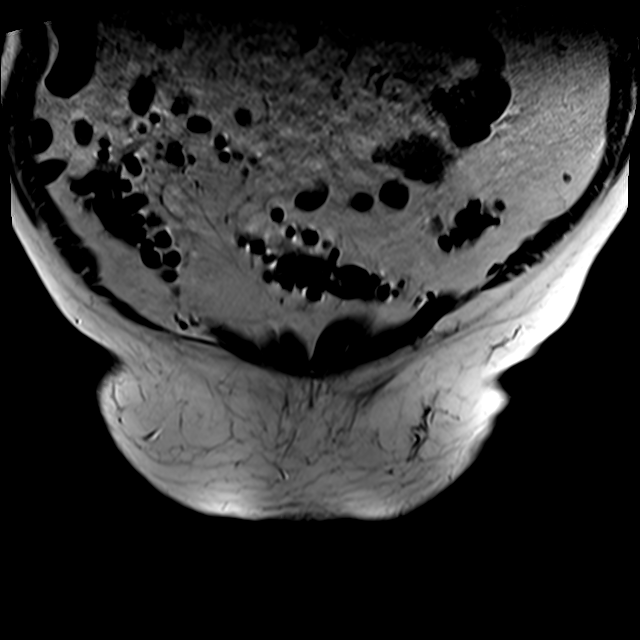

[Series 9: T2 fat-sat · axial · 4.0mm · 0.70mm/px · z∈[-304,-149]mm · 5 of 32 slices shown]
[im 1/32]
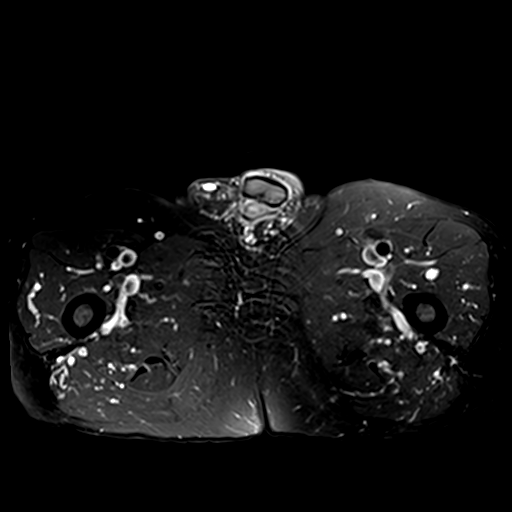
[im 8/32]
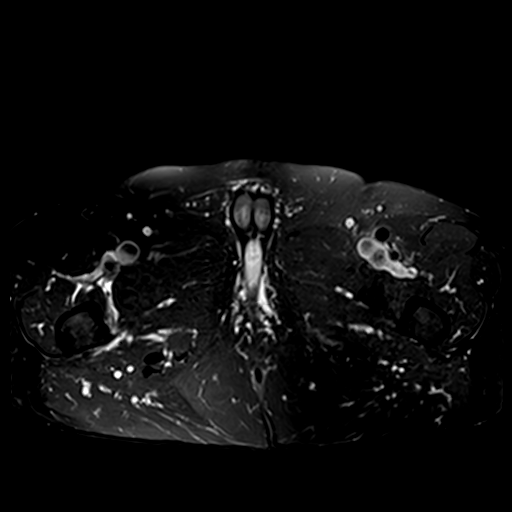
[im 16/32]
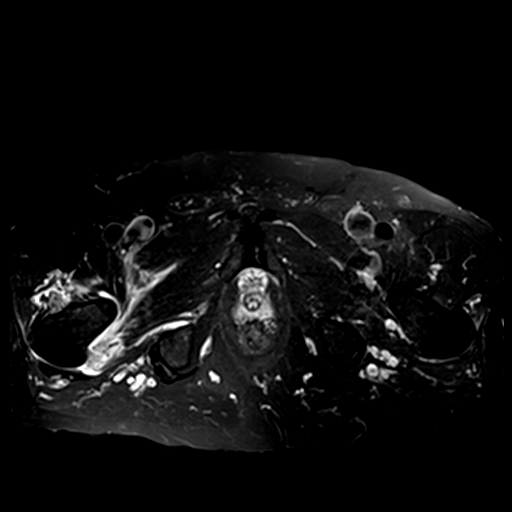
[im 24/32]
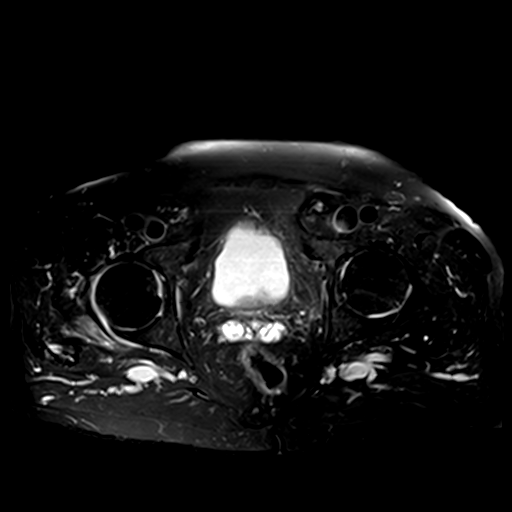
[im 32/32]
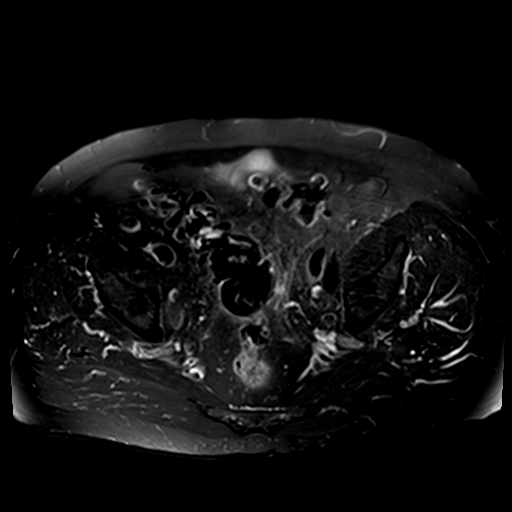

[Series 10: PD fat-sat · sagittal · 4.0mm · 0.70mm/px · 4 of 29 slices shown (1 of 2)]
[im 1/29]
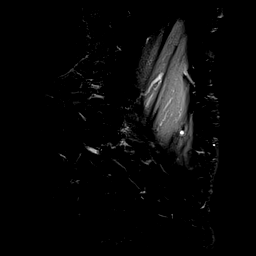
[im 10/29]
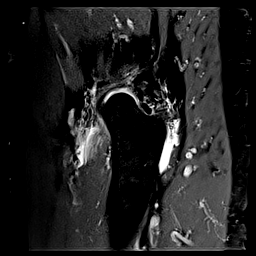
[im 19/29]
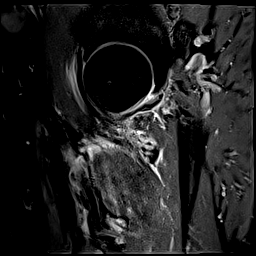
[im 29/29]
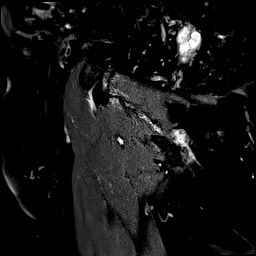

[Series 11: PD fat-sat · coronal · 4.0mm · 0.70mm/px · 4 of 29 slices shown (2 of 2)]
[im 1/29]
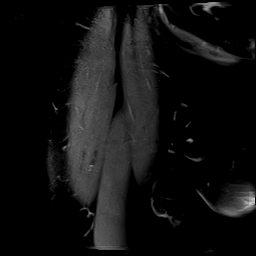
[im 10/29]
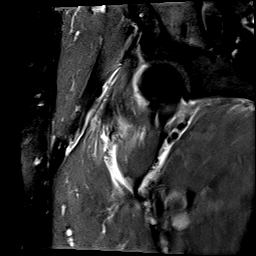
[im 19/29]
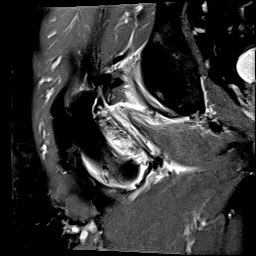
[im 29/29]
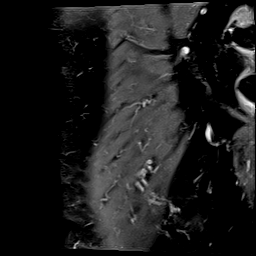

[Series 12: T1 fat-sat · axial · non-contrast · 4.0mm · 0.87mm/px · z∈[-310,-155]mm · 5 of 32 slices shown]
[im 1/32]
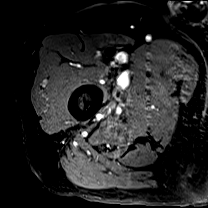
[im 8/32]
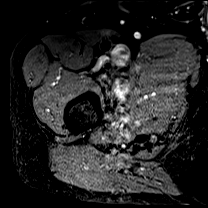
[im 16/32]
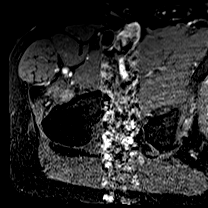
[im 24/32]
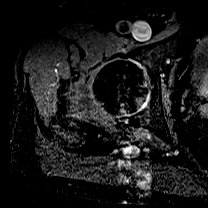
[im 32/32]
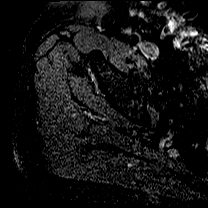

[Series 13: T1 fat-sat post-contrast · axial · 4.0mm · 0.87mm/px · z∈[-310,-155]mm · 5 of 32 slices shown (1 of 2)]
[im 1/32]
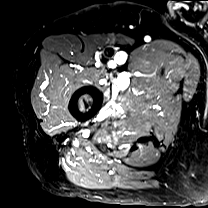
[im 8/32]
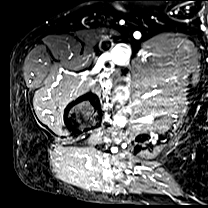
[im 16/32]
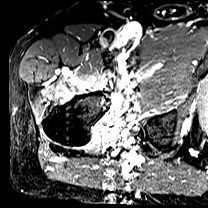
[im 24/32]
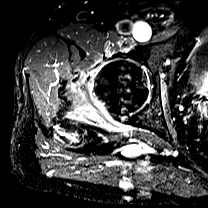
[im 32/32]
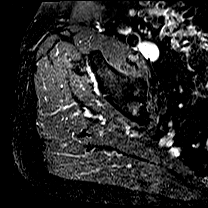

[Series 14: T1 fat-sat post-contrast · coronal · 4.0mm · 0.87mm/px · 4 of 29 slices shown (2 of 2)]
[im 1/29]
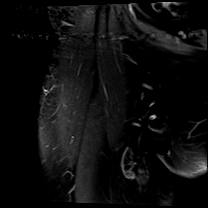
[im 10/29]
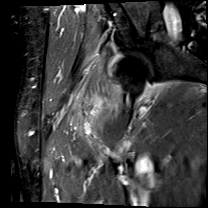
[im 19/29]
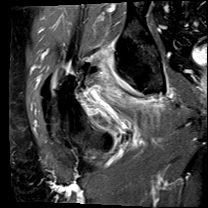
[im 29/29]
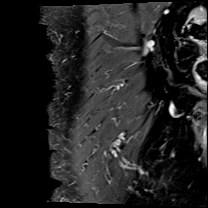

[33 of 40 positions shown; findings below may reference images not displayed]

FINDINGS: Bones: There is no evidence of acute fracture, dislocation or
avascular necrosis. No focal bone lesion. The visualized sacroiliac
joints and symphysis pubis appear normal.

Articular cartilage and labrum

Articular cartilage: No focal chondral defect or subchondral signal
abnormality identified.

Labrum: Grossly intact, although evaluation is limited due to lack
of intra-articular fluid. No paralabral abnormality.

Joint or bursal effusion

Joint effusion: Trace right hip joint effusion.

Bursae: No focal periarticular fluid collection.

Muscles and tendons

Muscles and tendons: Partial tear of the right gluteus medius tendon
(series 8, image 12). The bilateral hamstring and iliopsoas tendons
appear normal. Mild edema and enhancement in the proximal vastus
lateralis muscle. More prominent myofascial edema and enhancement
around the lesser trochanter.

Other findings

Miscellaneous: Severe colonic diverticulosis. Bilateral renal cysts.
IMPRESSION: 1. Myofascial edema/inflammation around the right intertrochanteric
femur is nonspecific, but concerning for infection given
leukocytosis, especially if there is no history of trauma. No
drainable fluid collection.
2. Trace right hip joint effusion without convincing evidence of
septic arthritis. No osteomyelitis.
3. Partial tear of the right gluteus medius tendon.

## 2021-07-06 IMAGING — CR DG LUMBAR SPINE COMPLETE 4+V
1 series · 5 of 5 positions shown · non-contrast
Comparison: None.

CLINICAL DATA: Right groin pain.

EXAM:
LUMBAR SPINE - COMPLETE 4+ VIEW

[Series 1: t lumbar spine ap · 0.14mm/px · 5 of 5 slices shown]
[im 1/5]
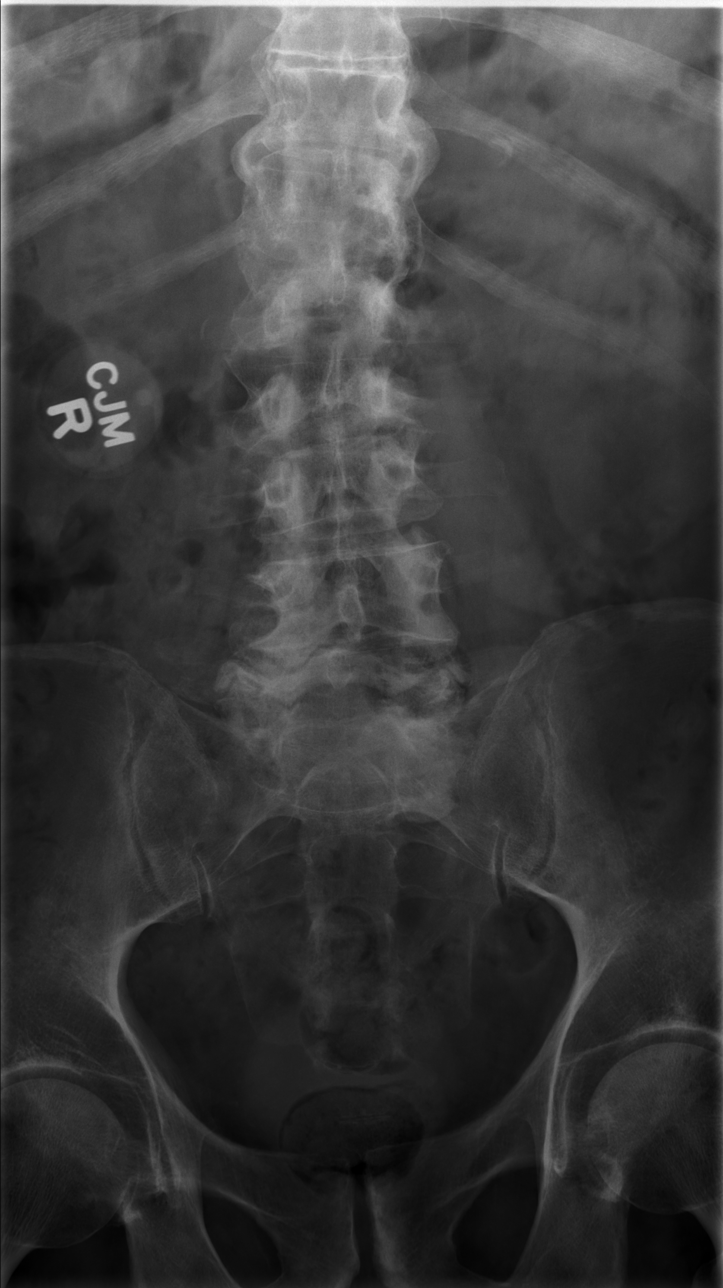
[im 2/5]
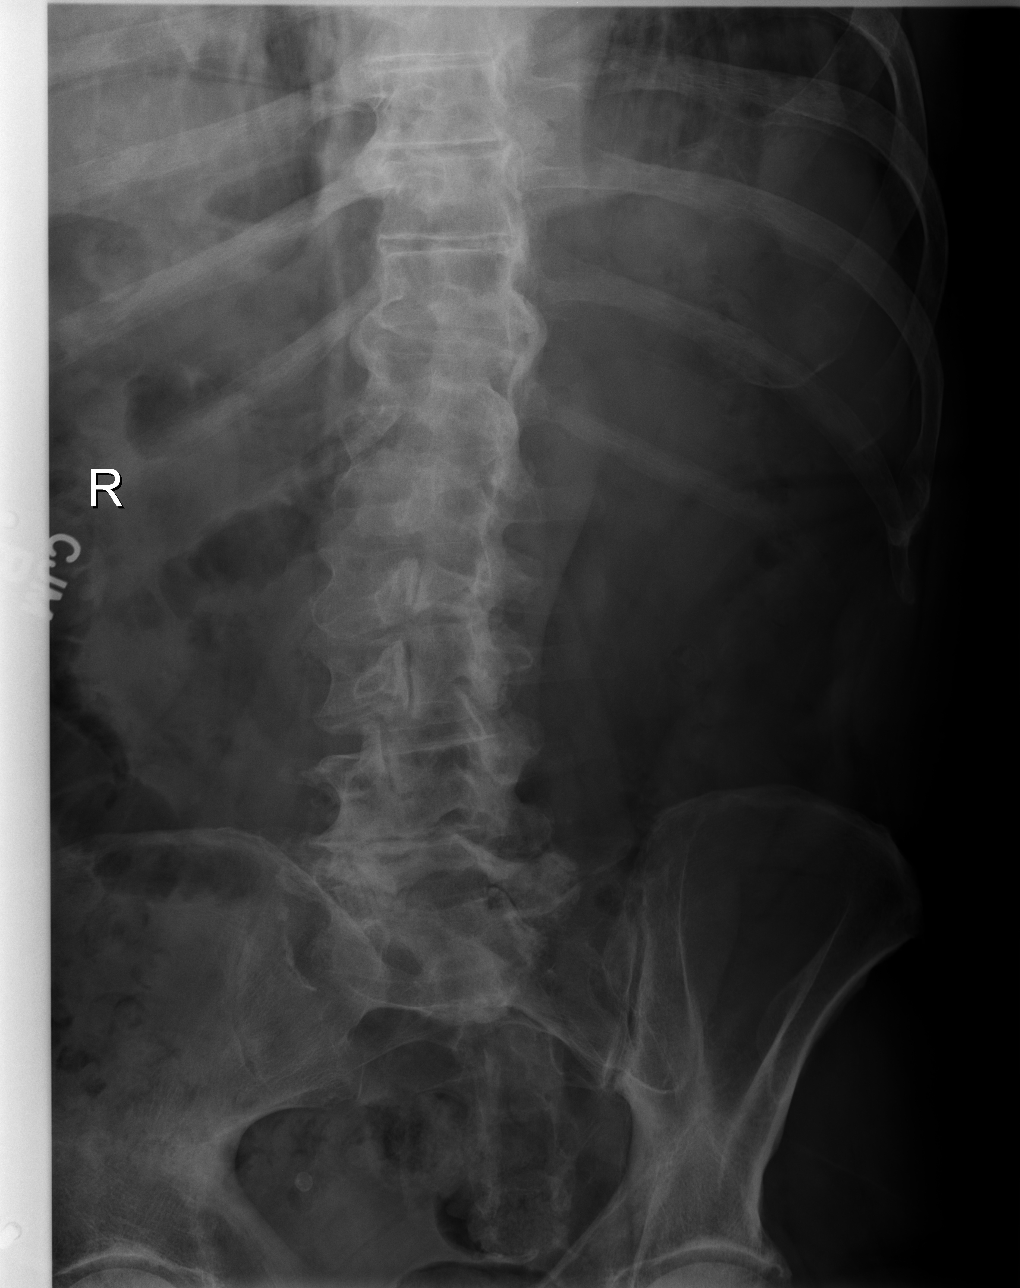
[im 3/5]
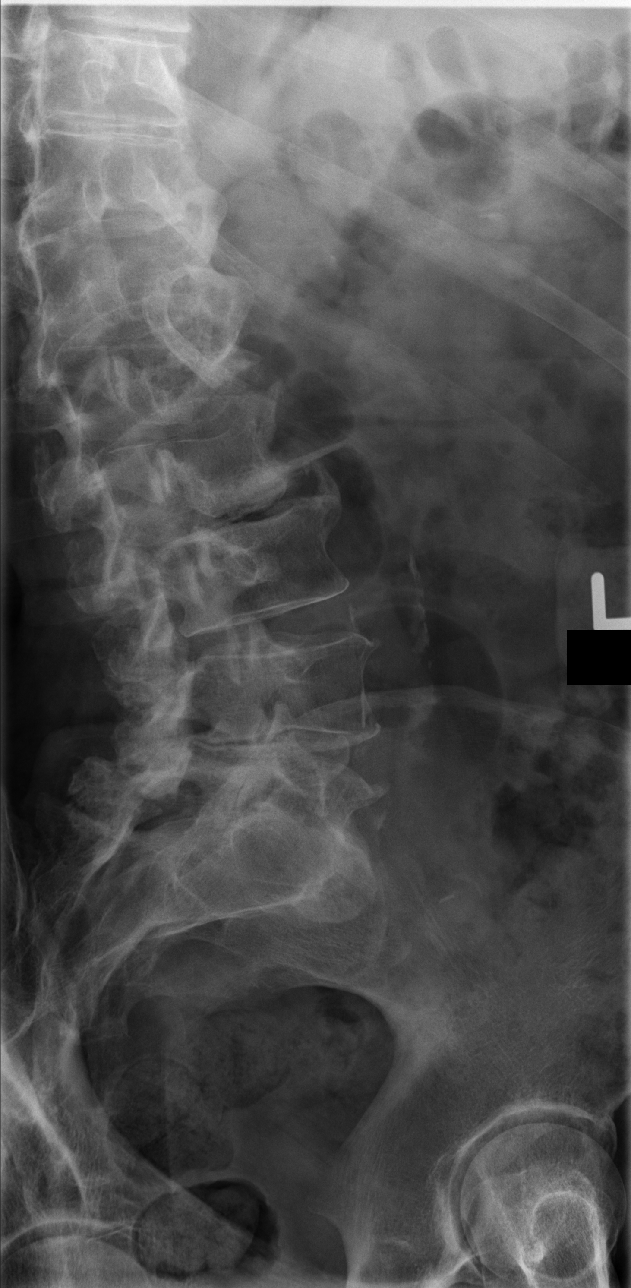
[im 4/5]
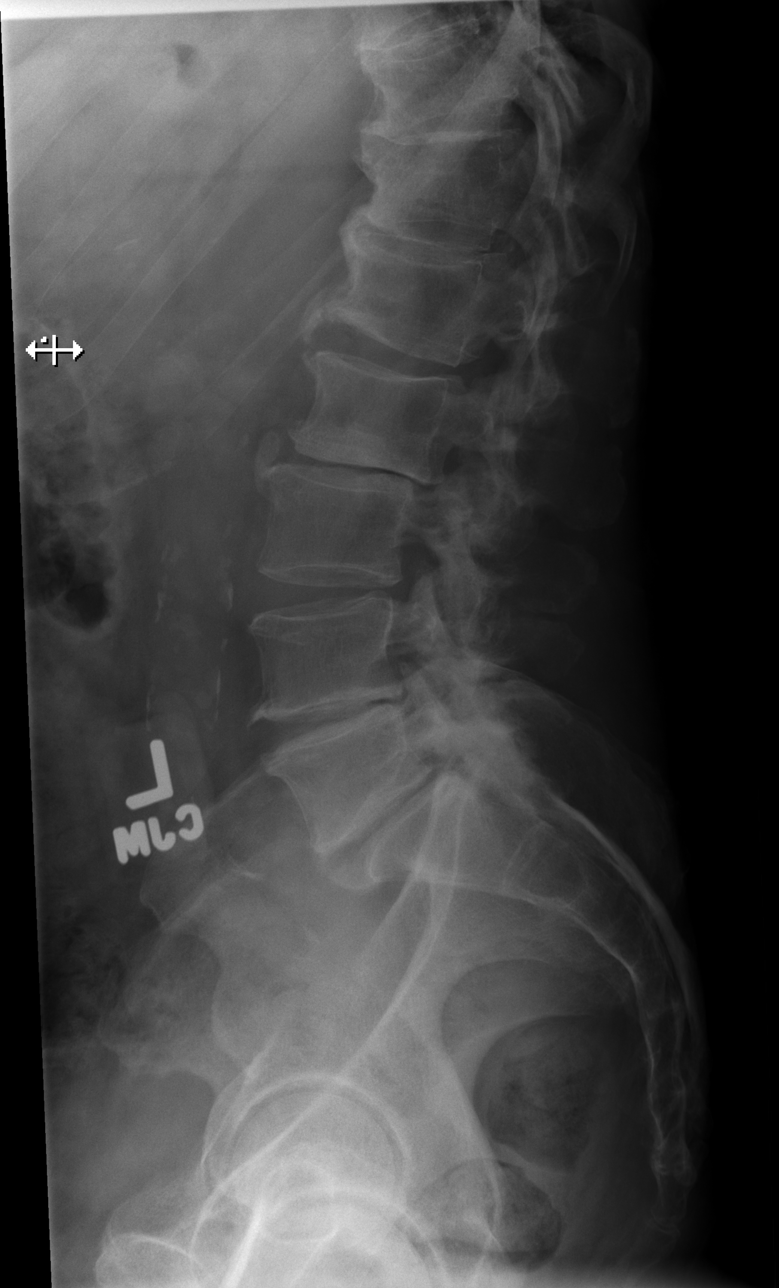
[im 5/5]
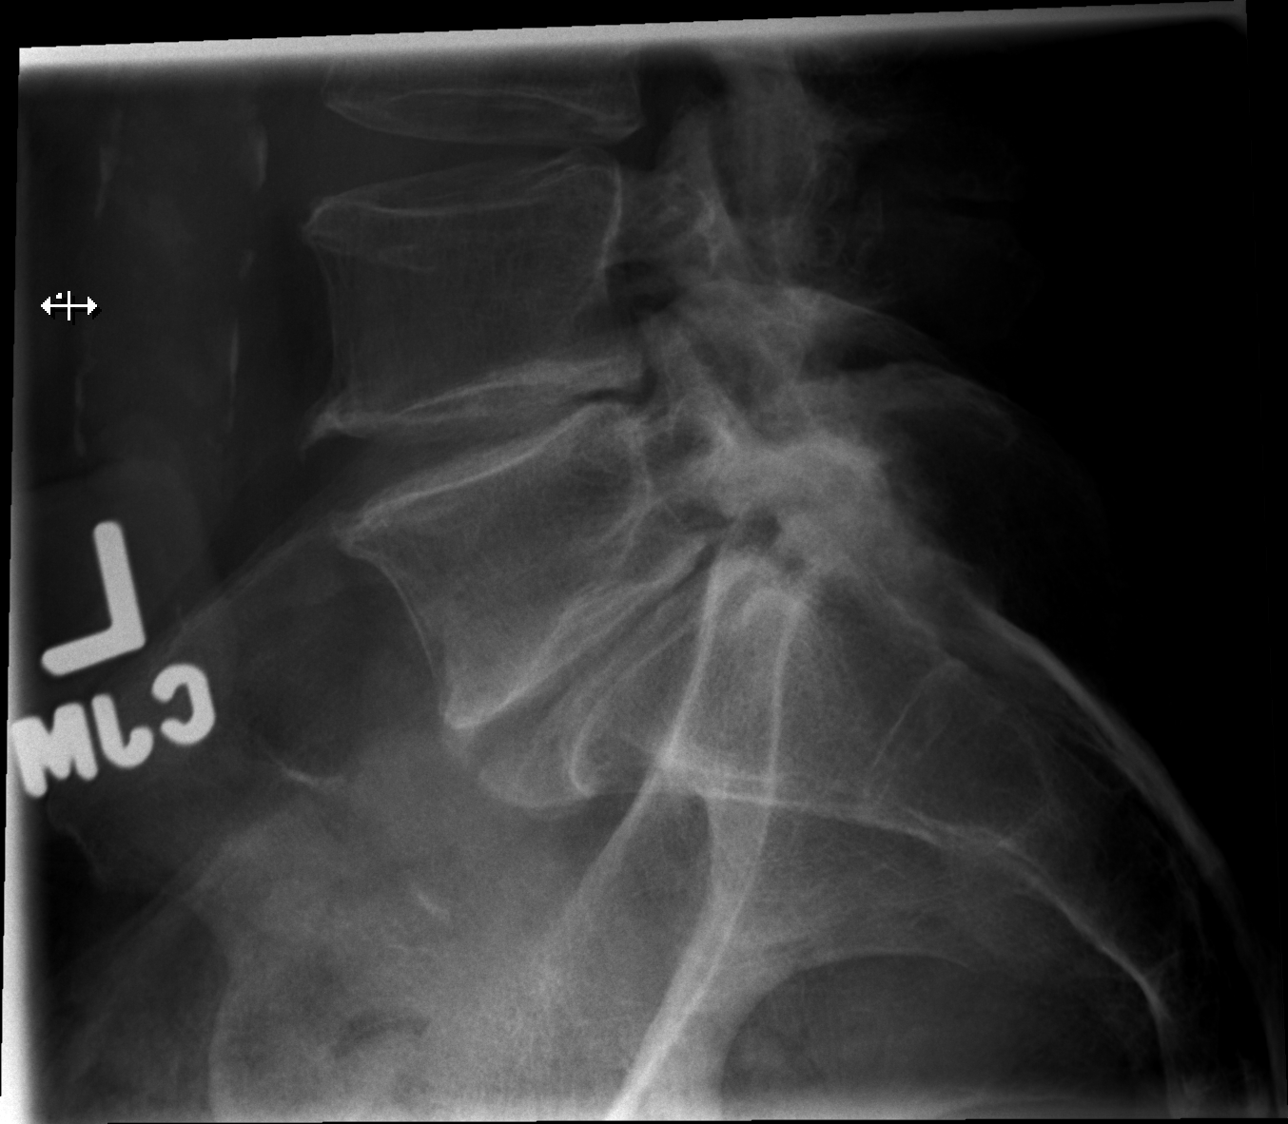

[5 of 5 positions shown; findings below may reference images not displayed]

FINDINGS: Mild grade 1 retrolisthesis of L2-3 is noted secondary to severe
degenerative disc disease at this level. No fracture is noted.
Moderate degenerative disc disease is also noted at L4-5 and L5-S1.
Degenerative changes are seen involving posterior facet joints of
L5-S1.
IMPRESSION: Multilevel degenerative disc disease. No acute abnormality is noted.

Aortic Atherosclerosis ([VI]-[VI]).

## 2021-07-06 IMAGING — CR DG HIP (WITH OR WITHOUT PELVIS) 2-3V*R*
1 series · 3 of 3 positions shown · non-contrast
Comparison: None.

CLINICAL DATA: Right hip and groin pain

EXAM:
DG HIP (WITH OR WITHOUT PELVIS) 2-3V RIGHT

[Series 1: t femur right 0-3yrs · 0.14mm/px · 3 of 3 slices shown]
[im 1/3]
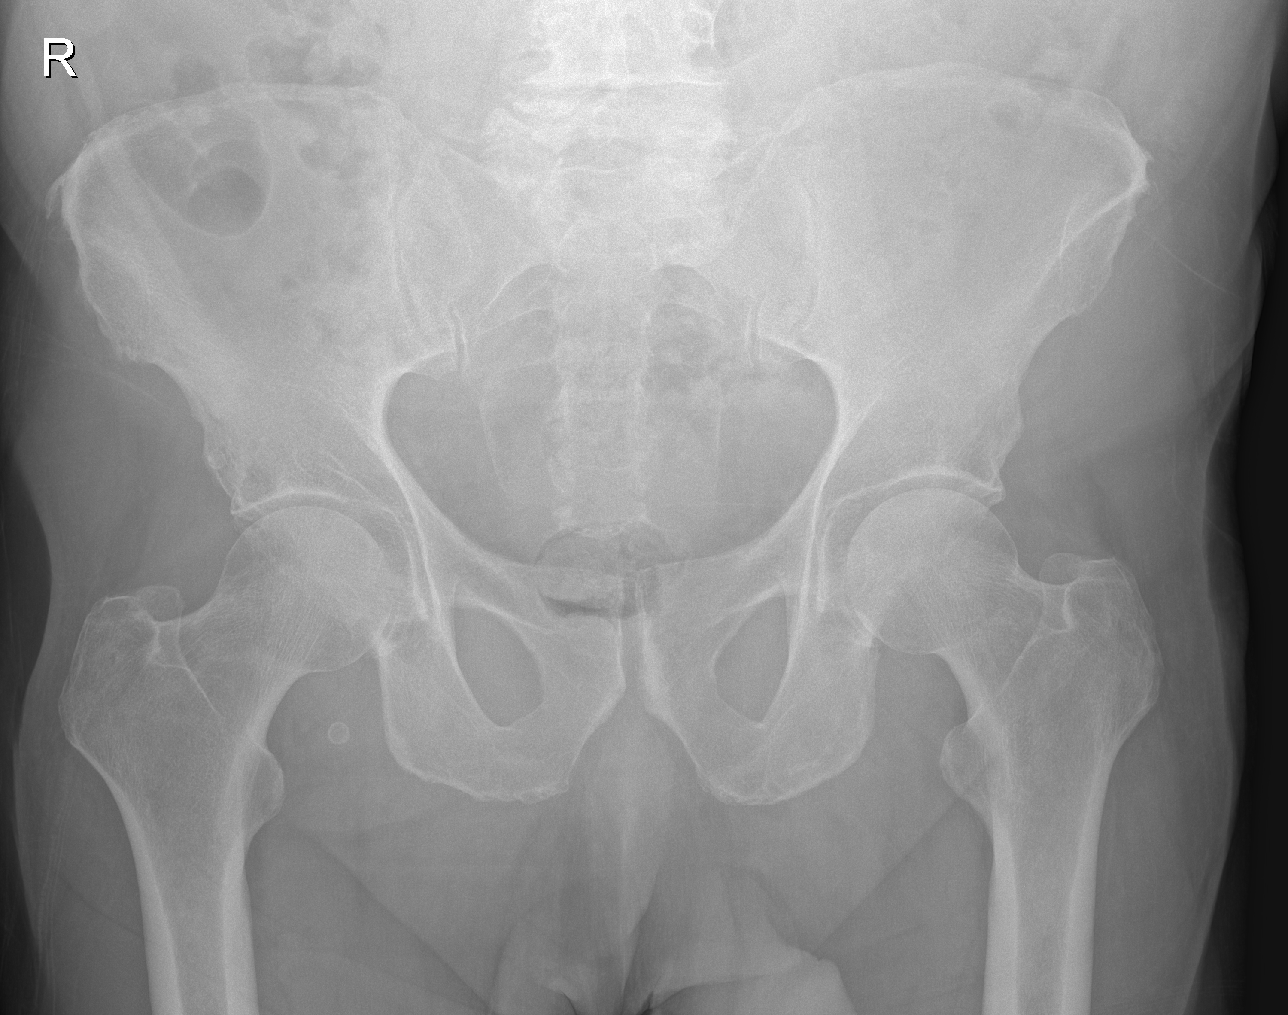
[im 2/3]
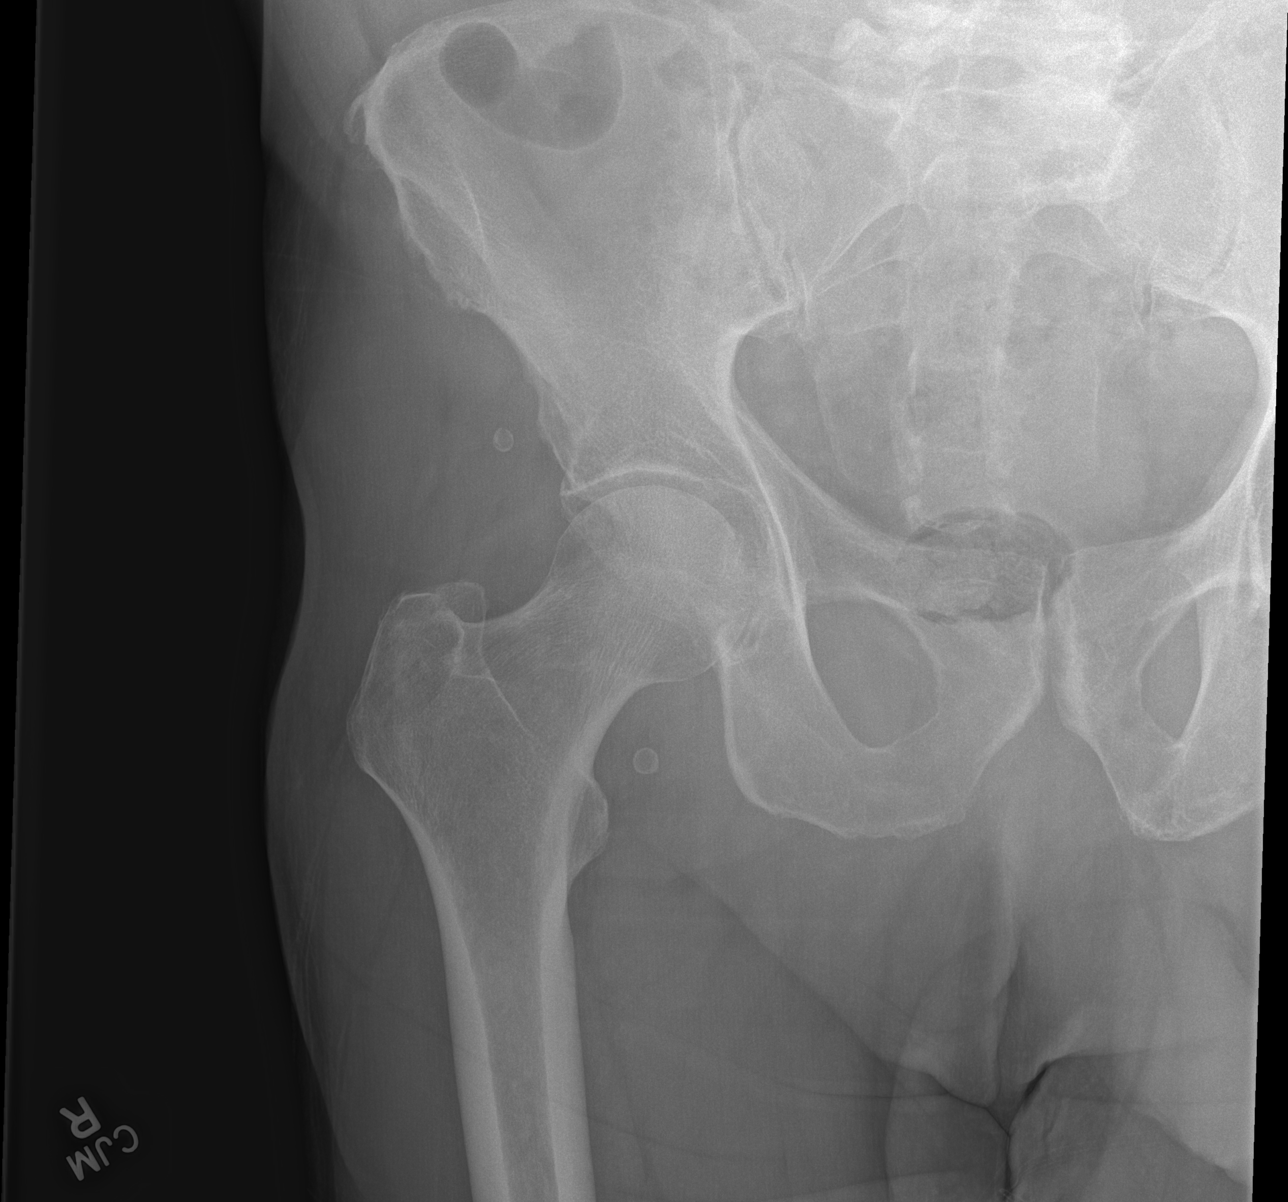
[im 3/3]
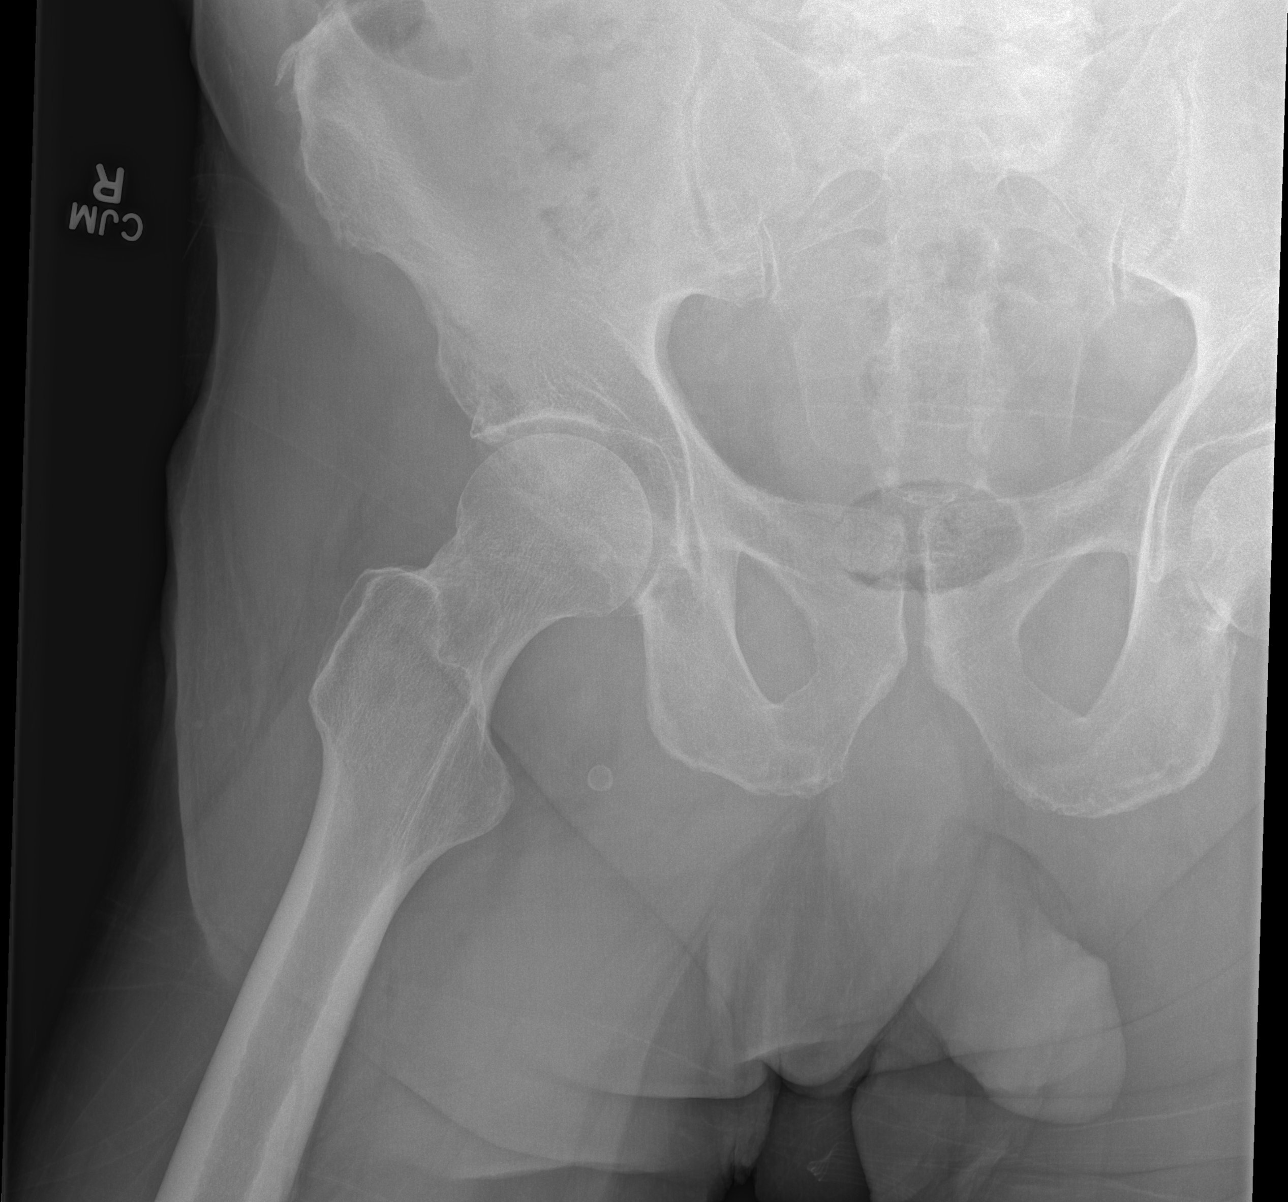

[3 of 3 positions shown; findings below may reference images not displayed]

FINDINGS: There is no evidence of hip fracture or dislocation. Mild hip joint
space narrowing. Partially visualized lower lumbar spondylosis.
IMPRESSION: Mild degenerative changes of the right hip.  No acute findings.

## 2021-07-06 IMAGING — DX DG CHEST 1V PORT
1 series · 1 of 1 positions shown · non-contrast
Comparison: CT chest and chest x-ray dated [DATE].

CLINICAL DATA: Right groin pain.

EXAM:
PORTABLE CHEST 1 VIEW

[chest ap]
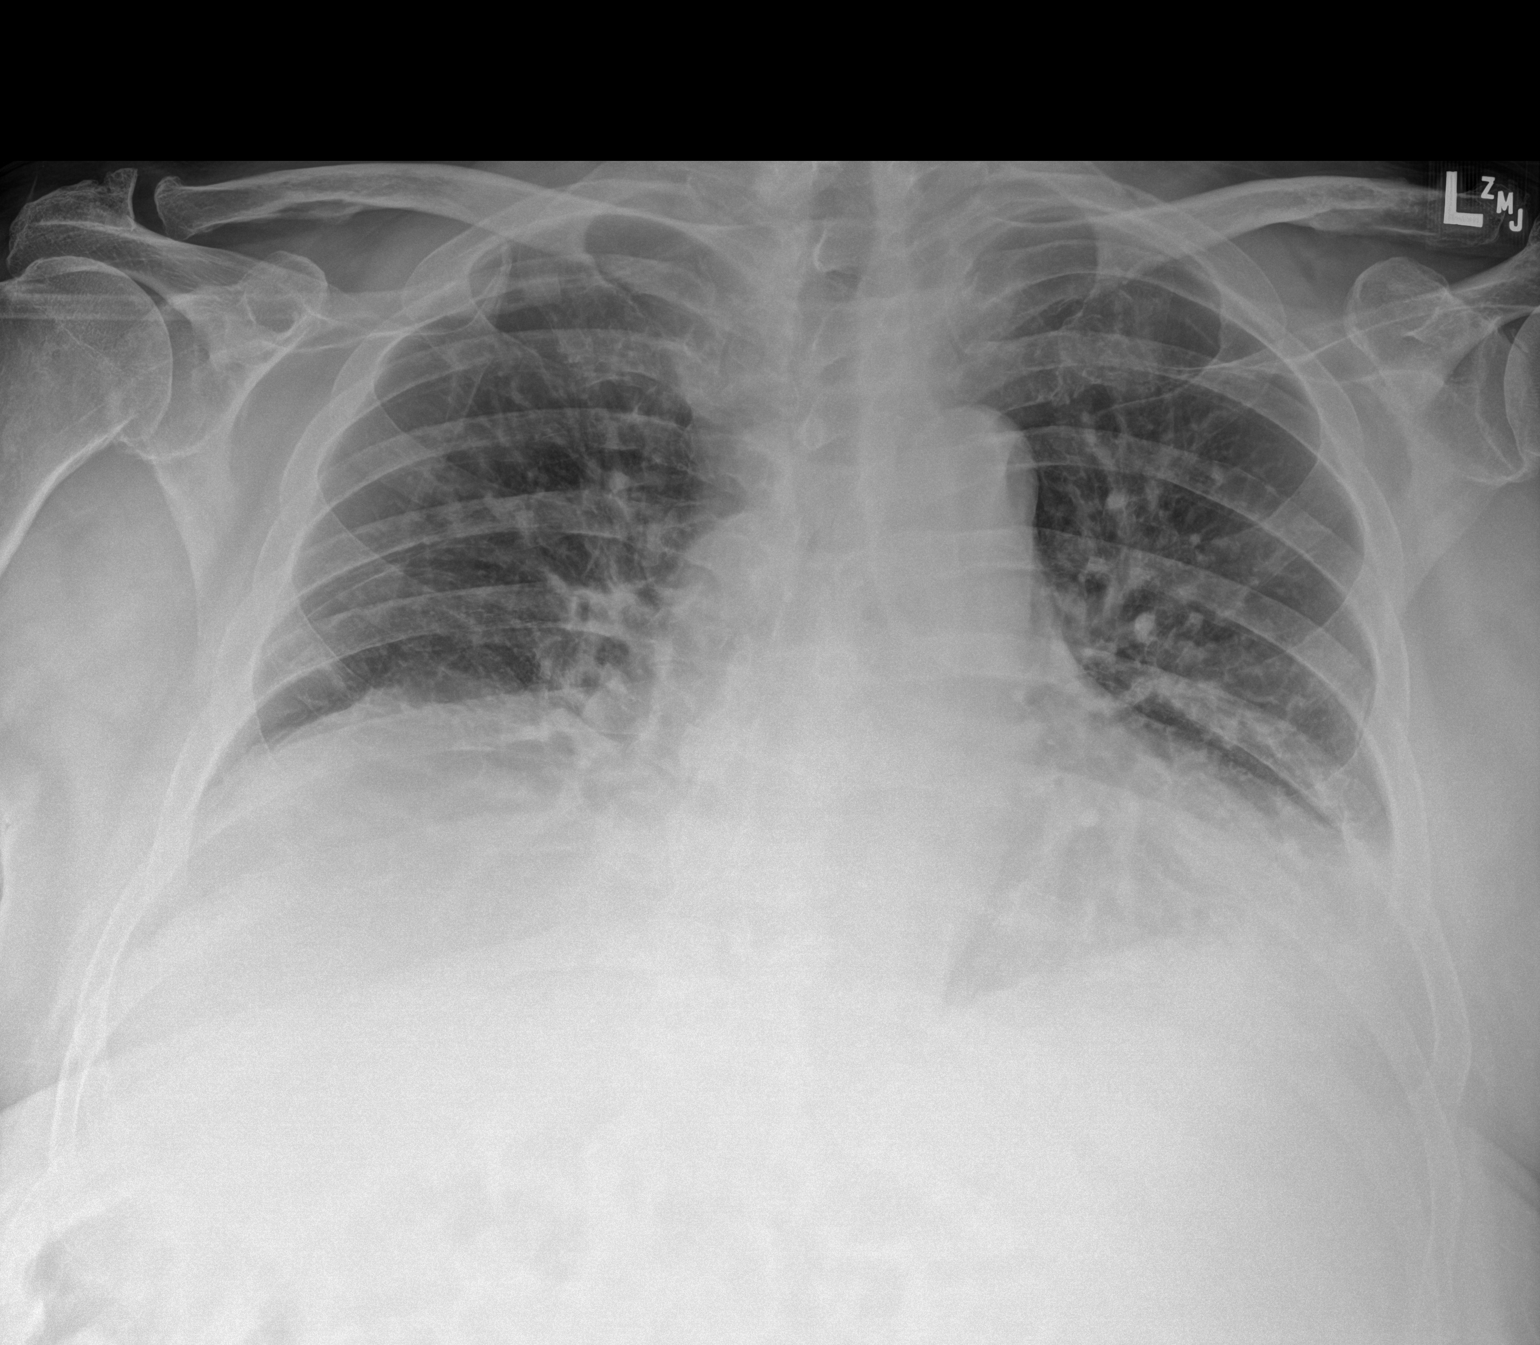

[1 of 1 positions shown; findings below may reference images not displayed]

FINDINGS: The heart size and mediastinal contours are within normal limits.
Low lung volumes are present, causing crowding of the pulmonary
vasculature. Mild bibasilar atelectasis. No focal consolidation,
pleural effusion, or pneumothorax. No acute osseous abnormality.
IMPRESSION: 1. No active disease.

## 2021-07-06 IMAGING — MR MR LUMBAR SPINE WO/W CM
1 series · 17 of 17 positions shown · IV contrast (gadavist)
Comparison: Lumbar radiographs from the same day.

CLINICAL DATA: Lumbar radiculopathy, cancer or infection suspected

EXAM:
MRI LUMBAR SPINE WITHOUT AND WITH CONTRAST
TECHNIQUE: Multiplanar and multiecho pulse sequences of the lumbar spine were
obtained without and with intravenous contrast.
CONTRAST:  10mL GADAVIST GADOBUTROL 1 MMOL/ML IV SOLN

[Series 5: T2 · sagittal · 4.0mm · 0.81mm/px · 17 of 17 slices shown]
[im 1/17]
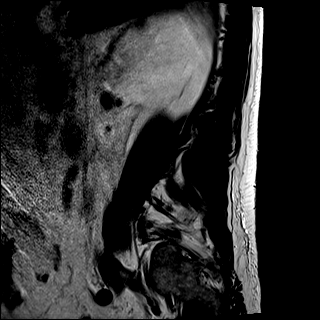
[im 2/17]
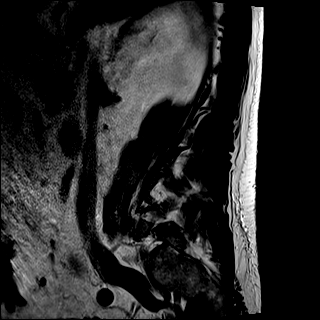
[im 3/17]
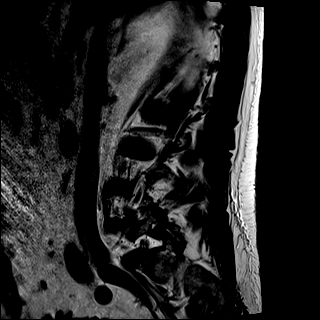
[im 4/17]
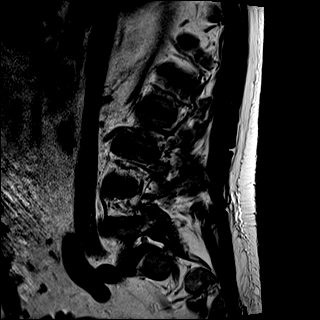
[im 5/17]
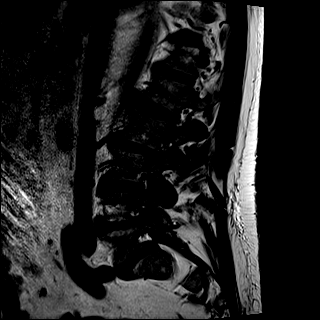
[im 6/17]
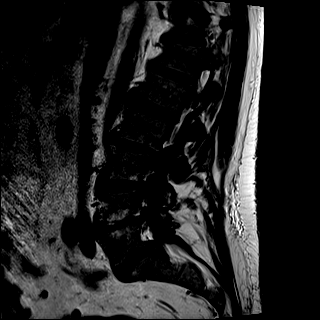
[im 7/17]
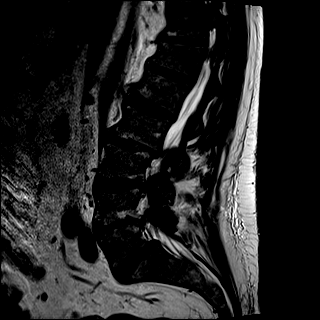
[im 8/17]
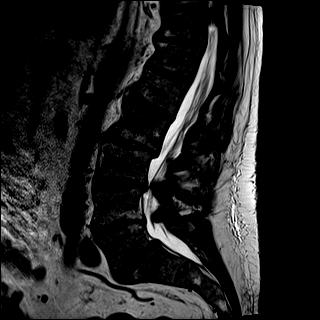
[im 9/17]
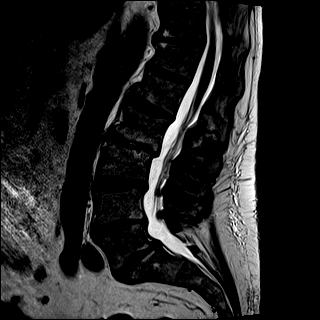
[im 10/17]
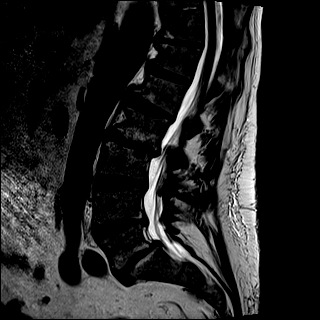
[im 11/17]
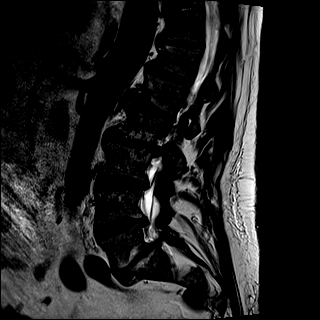
[im 12/17]
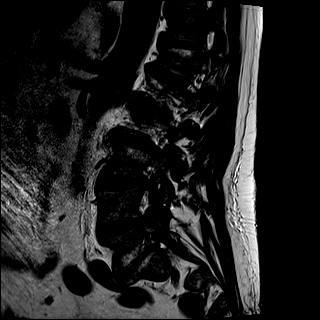
[im 13/17]
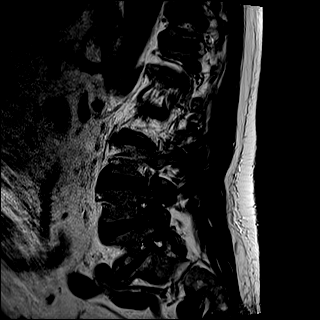
[im 14/17]
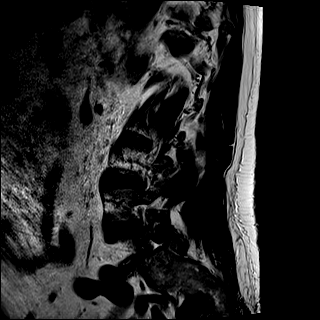
[im 15/17]
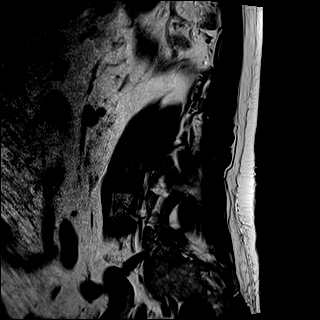
[im 16/17]
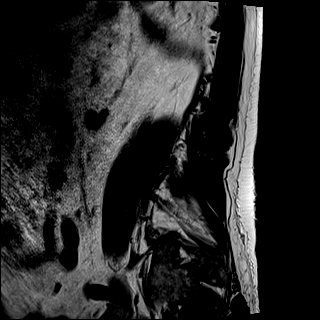
[im 17/17]
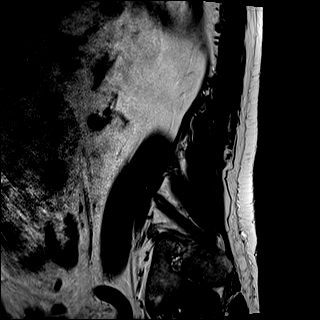

[17 of 17 positions shown; findings below may reference images not displayed]

FINDINGS: Segmentation: Standard segmentation is assumed with the
inferior-most fully formed intervertebral disc labeled L5-S1.

Alignment: Grade 1 retrolisthesis of L2 on L3 and grade 1
anterolisthesis of L5 on S1. Slight retrolisthesis of L3 on L4 and
L4 on L5. Dextrocurvature of the upper lumbar spine with
levocurvature of the lower lumbar spine.

Vertebrae: Degenerative/discogenic endplate signal changes about the
left L2-L3 and L5-S1 and right L4-L5 disc spaces, where there is
disc space narrowing. Otherwise, no focal marrow edema to suggest
acute fracture or discitis/osteomyelitis. Mild disc edema and
enhancement at the sites of degenerative disc space narrowing
described above. Mildly heterogeneous marrow without suspicious bone
lesion.

Conus medullaris and cauda equina: Conus extends to the L1-L2 level.
Conus appears normal. No abnormal enhancement of the conus or cauda
equina. Fatty filum terminalis.

Paraspinal and other soft tissues: Bilateral renal cysts. No
appreciable paraspinal edema.

Disc levels:

T11-T12: Only imaged sagittally without evidence of significant
canal or foraminal stenosis.

T12-L1: No significant disc protrusion, foraminal stenosis, or canal
stenosis.

L1-L2: Mild disc bulging and facet arthropathy without significant
canal or foraminal stenosis.

L2-L3: Left eccentric degenerative disc disease. Grade 1
retrolisthesis of L2 on L3. Disc bulging, including biforaminal disc
protrusions. Mild to moderate right greater than left foraminal
stenosis. Mild bilateral subarticular recess narrowing without
significant canal stenosis.

L3-L4: Slight retrolisthesis of L3 on L4. Disc bulging, including
biforaminal disc protrusions. Moderate bilateral facet hypertrophy.
Resulting mild to moderate right greater than left foraminal
stenosis. Mild canal stenosis and right greater than left
subarticular recess stenosis

L4-L5: Right eccentric degenerative disc disease. Slight
retrolisthesis of L4 on L5. Disc bulging, including biforaminal disc
protrusions, and ligamentum flavum thickening. Resulting moderate
bilateral foraminal stenosis. Mild canal stenosis with right greater
than left subarticular recess stenosis.

L5-S1: Grade 1 anterolisthesis of L5 on S1. Broad disc bulge with
moderate bilateral facet hypertrophy. Resulting moderate left and
mild right foraminal stenosis.
IMPRESSION: 1. Moderate foraminal stenosis bilaterally at L4-L5 and on the left
at L5-S1. Mild-to-moderate right greater than left foraminal
stenosis at L2-L3 and L3-L4.
2. Mild canal stenosis at L3-L4 and L4-L5 with right greater than
left subarticular recess narrowing.
3. Degenerative disc disease greatest at L2-L3, L4-L5 and L5-S1,
detailed above.

## 2021-07-06 IMAGING — MR MR LUMBAR SPINE WO/W CM
4 of 6 series · 30 of 48 positions shown · IV contrast (10ml Gadavist)
Comparison: Lumbar radiographs from the same day.

CLINICAL DATA: Lumbar radiculopathy, cancer or infection suspected

EXAM:
MRI LUMBAR SPINE WITHOUT AND WITH CONTRAST
TECHNIQUE: Multiplanar and multiecho pulse sequences of the lumbar spine were
obtained without and with intravenous contrast.
CONTRAST:  10mL GADAVIST GADOBUTROL 1 MMOL/ML IV SOLN

[Series 5: T1 · sagittal · 4.0mm · 0.81mm/px · 5 of 17 slices shown (1 of 2)]
[im 1/17]
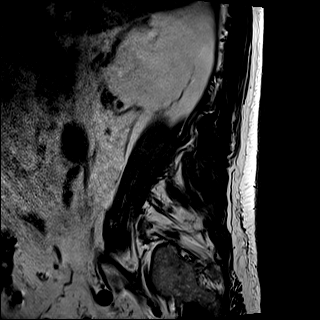
[im 5/17]
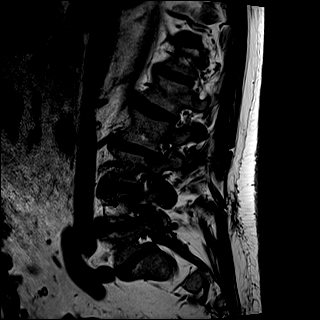
[im 9/17]
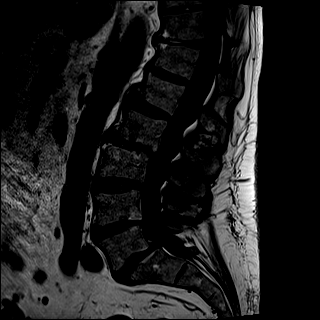
[im 13/17]
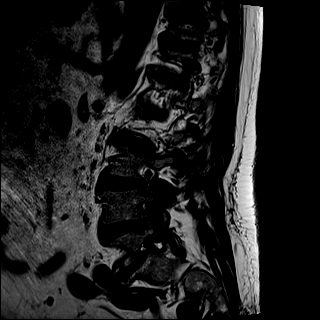
[im 17/17]
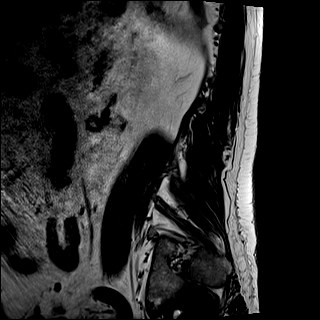

[Series 7: T2 · axial · 4.0mm · 0.78mm/px · z∈[-117,+85]mm · 11 of 36 slices shown]
[im 1/36]
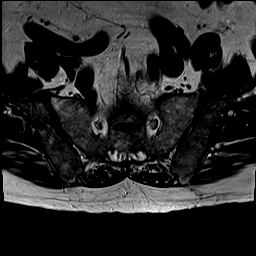
[im 4/36]
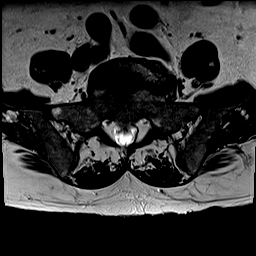
[im 8/36]
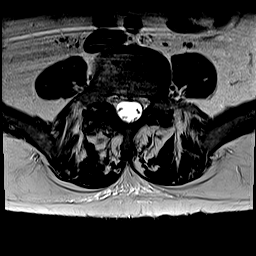
[im 11/36]
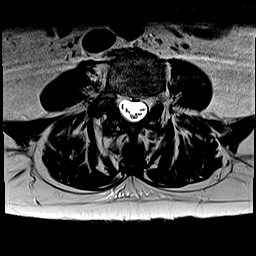
[im 15/36]
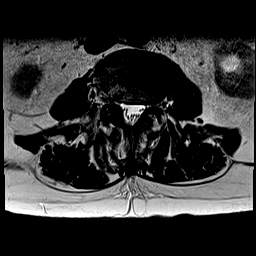
[im 18/36]
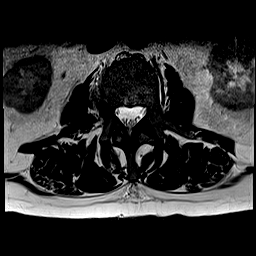
[im 22/36]
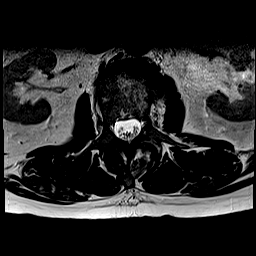
[im 25/36]
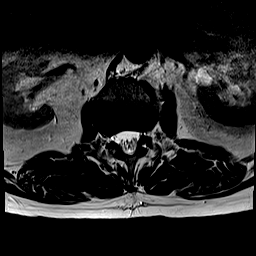
[im 29/36]
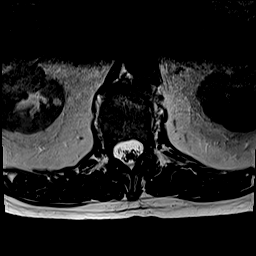
[im 32/36]
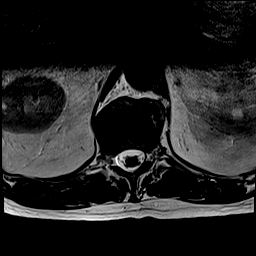
[im 36/36]
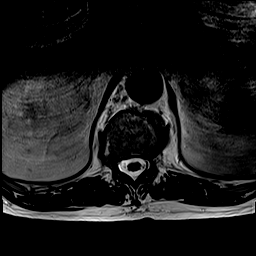

[Series 8: T1 · axial · 4.0mm · 0.39mm/px · z∈[-117,+85]mm · 11 of 36 slices shown (2 of 2)]
[im 1/36]
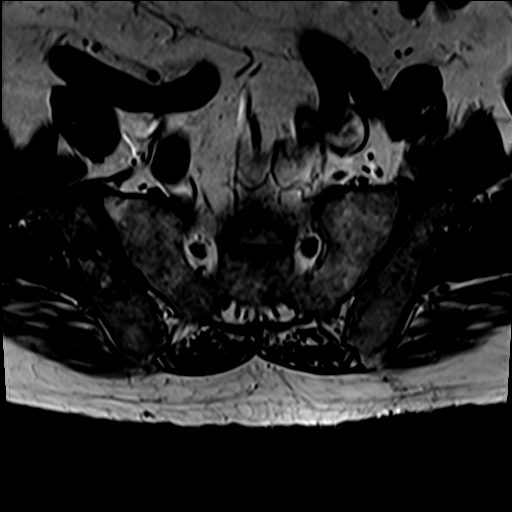
[im 4/36]
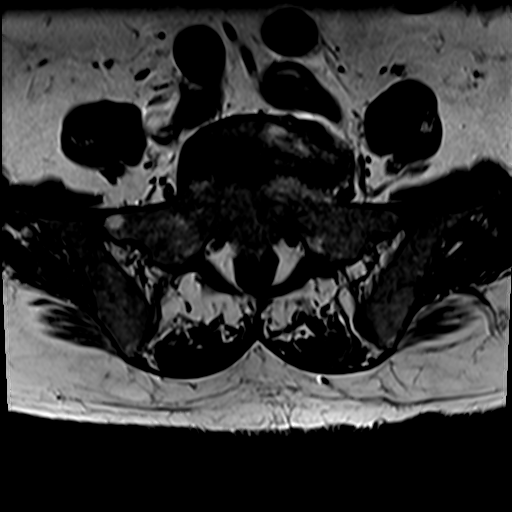
[im 8/36]
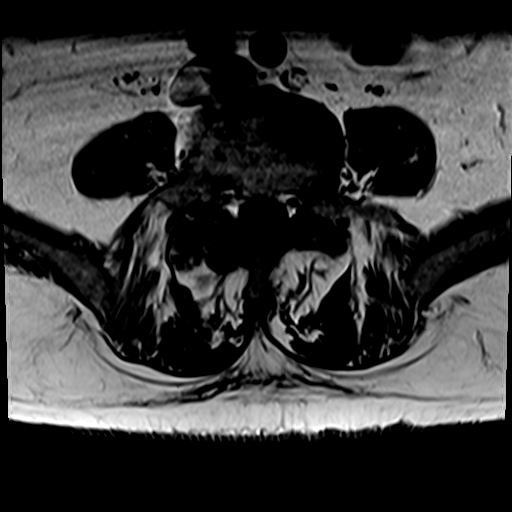
[im 11/36]
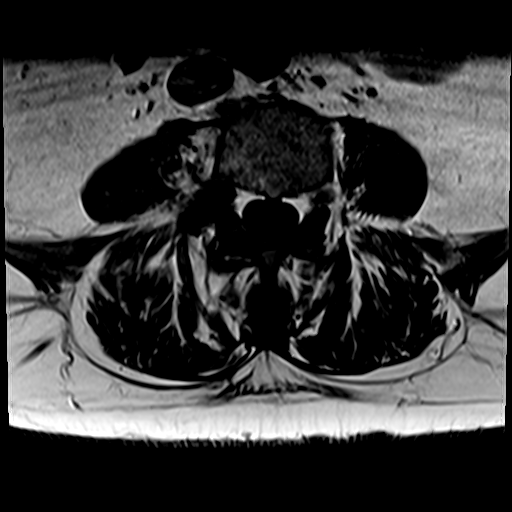
[im 15/36]
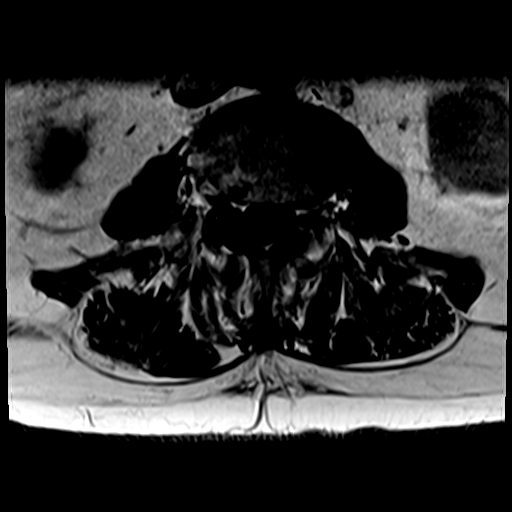
[im 18/36]
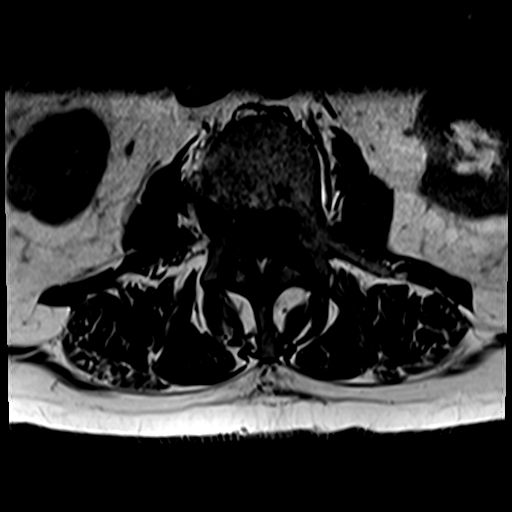
[im 22/36]
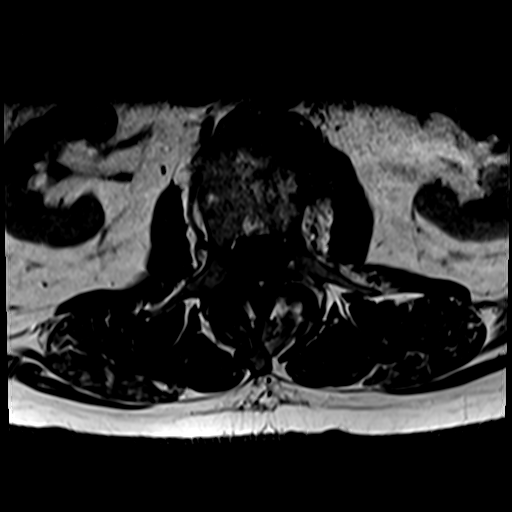
[im 25/36]
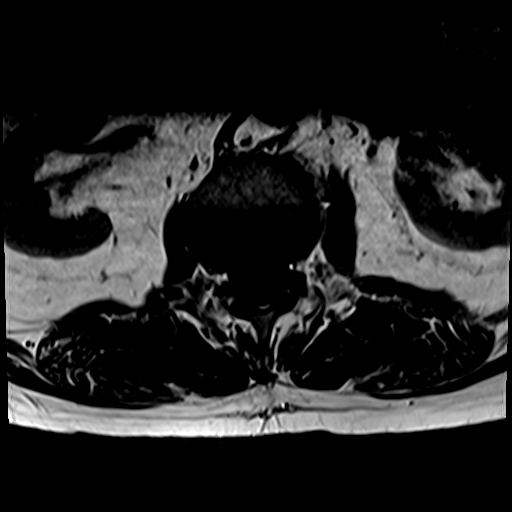
[im 29/36]
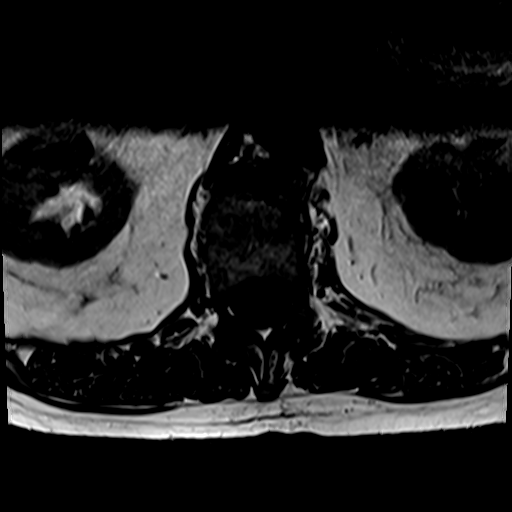
[im 32/36]
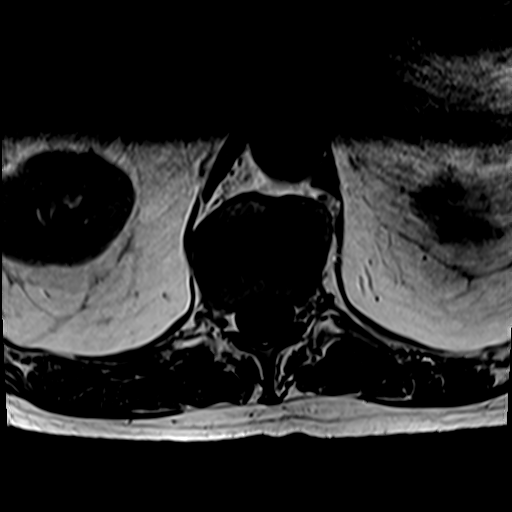
[im 36/36]
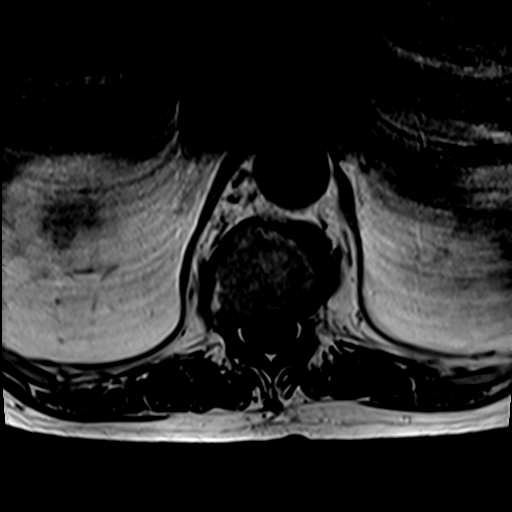

[Series 9: T1 fat-sat post-contrast · sagittal · 4.0mm · 0.81mm/px · 3 of 17 slices shown]
[im 1/17]
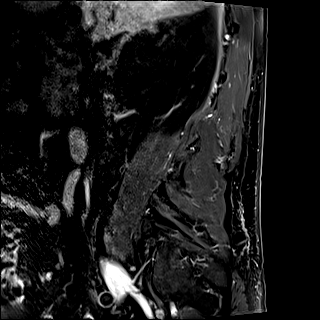
[im 9/17]
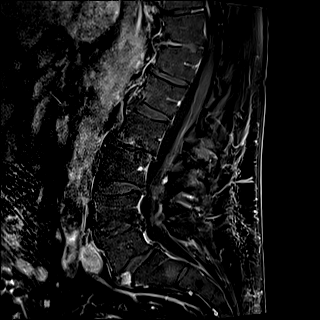
[im 17/17]
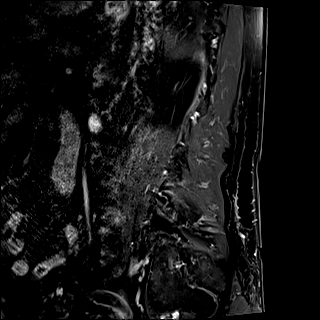

[30 of 48 positions shown; findings below may reference images not displayed]

FINDINGS: Segmentation: Standard segmentation is assumed with the
inferior-most fully formed intervertebral disc labeled L5-S1.

Alignment: Grade 1 retrolisthesis of L2 on L3 and grade 1
anterolisthesis of L5 on S1. Slight retrolisthesis of L3 on L4 and
L4 on L5. Dextrocurvature of the upper lumbar spine with
levocurvature of the lower lumbar spine.

Vertebrae: Degenerative/discogenic endplate signal changes about the
left L2-L3 and L5-S1 and right L4-L5 disc spaces, where there is
disc space narrowing. Otherwise, no focal marrow edema to suggest
acute fracture or discitis/osteomyelitis. Mild disc edema and
enhancement at the sites of degenerative disc space narrowing
described above. Mildly heterogeneous marrow without suspicious bone
lesion.

Conus medullaris and cauda equina: Conus extends to the L1-L2 level.
Conus appears normal. No abnormal enhancement of the conus or cauda
equina. Fatty filum terminalis.

Paraspinal and other soft tissues: Bilateral renal cysts. No
appreciable paraspinal edema.

Disc levels:

T11-T12: Only imaged sagittally without evidence of significant
canal or foraminal stenosis.

T12-L1: No significant disc protrusion, foraminal stenosis, or canal
stenosis.

L1-L2: Mild disc bulging and facet arthropathy without significant
canal or foraminal stenosis.

L2-L3: Left eccentric degenerative disc disease. Grade 1
retrolisthesis of L2 on L3. Disc bulging, including biforaminal disc
protrusions. Mild to moderate right greater than left foraminal
stenosis. Mild bilateral subarticular recess narrowing without
significant canal stenosis.

L3-L4: Slight retrolisthesis of L3 on L4. Disc bulging, including
biforaminal disc protrusions. Moderate bilateral facet hypertrophy.
Resulting mild to moderate right greater than left foraminal
stenosis. Mild canal stenosis and right greater than left
subarticular recess stenosis

L4-L5: Right eccentric degenerative disc disease. Slight
retrolisthesis of L4 on L5. Disc bulging, including biforaminal disc
protrusions, and ligamentum flavum thickening. Resulting moderate
bilateral foraminal stenosis. Mild canal stenosis with right greater
than left subarticular recess stenosis.

L5-S1: Grade 1 anterolisthesis of L5 on S1. Broad disc bulge with
moderate bilateral facet hypertrophy. Resulting moderate left and
mild right foraminal stenosis.
IMPRESSION: 1. Moderate foraminal stenosis bilaterally at L4-L5 and on the left
at L5-S1. Mild-to-moderate right greater than left foraminal
stenosis at L2-L3 and L3-L4.
2. Mild canal stenosis at L3-L4 and L4-L5 with right greater than
left subarticular recess narrowing.
3. Degenerative disc disease greatest at L2-L3, L4-L5 and L5-S1,
detailed above.

## 2021-07-06 IMAGING — US US EXTREM LOW VENOUS*R*
1 series · 13 of 24 positions shown · non-contrast
Comparison: [DATE]

CLINICAL DATA: Right lower extremity pain 3-4 weeks, history of
positive DVT in [QY]



[Series 1: us venous img lower uni right (dvt) · portal-venous · 13 of 32 slices shown]
[im 1/32]
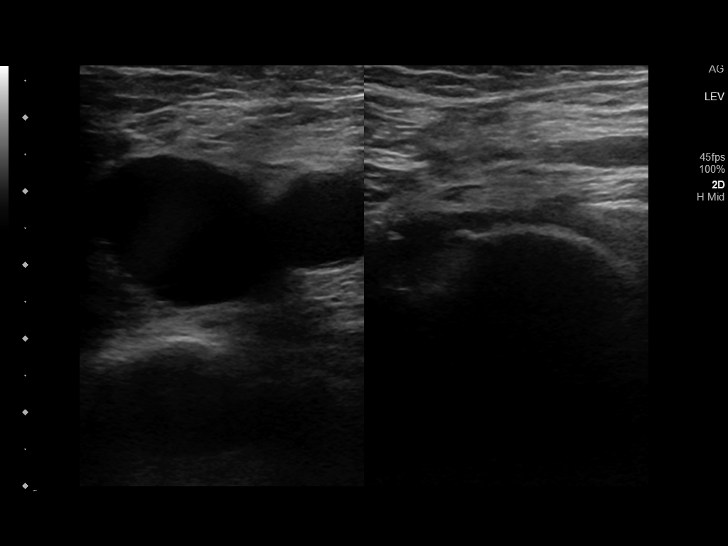
[im 3/32]
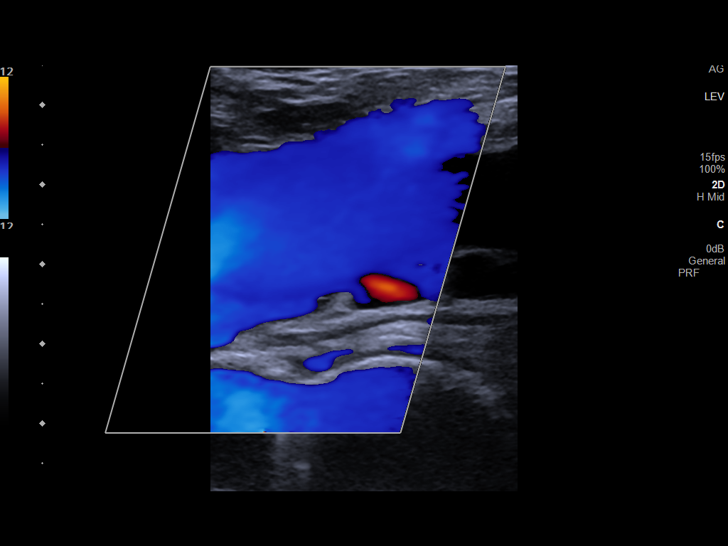
[im 6/32]
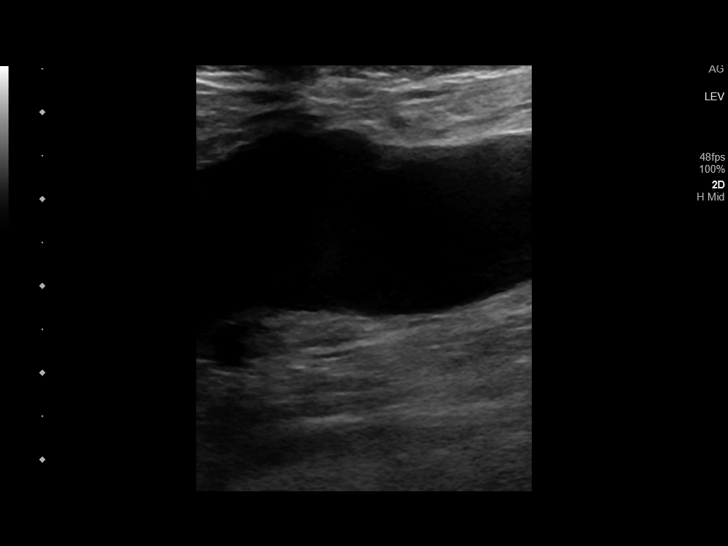
[im 9/32]
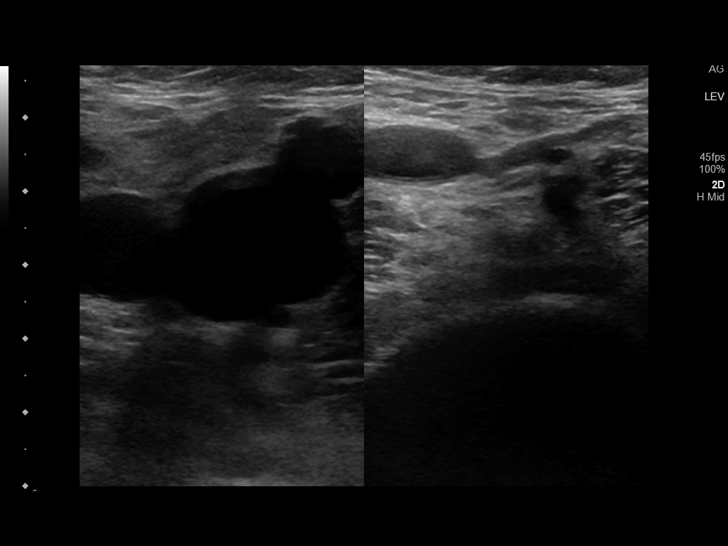
[im 11/32]
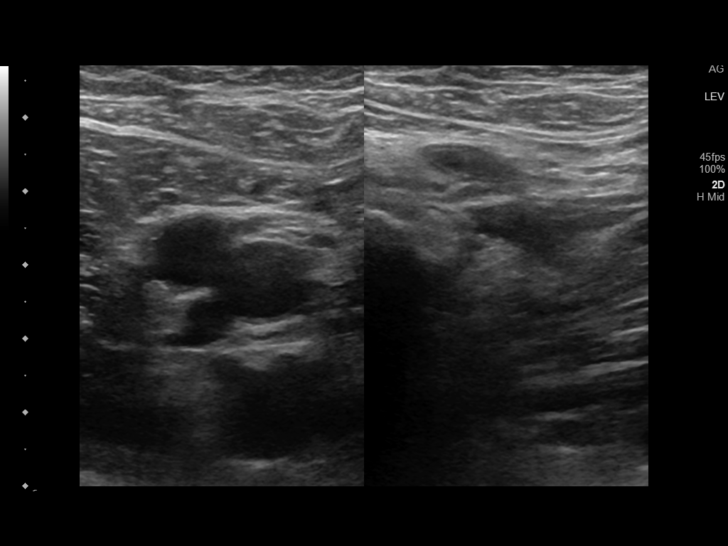
[im 14/32]
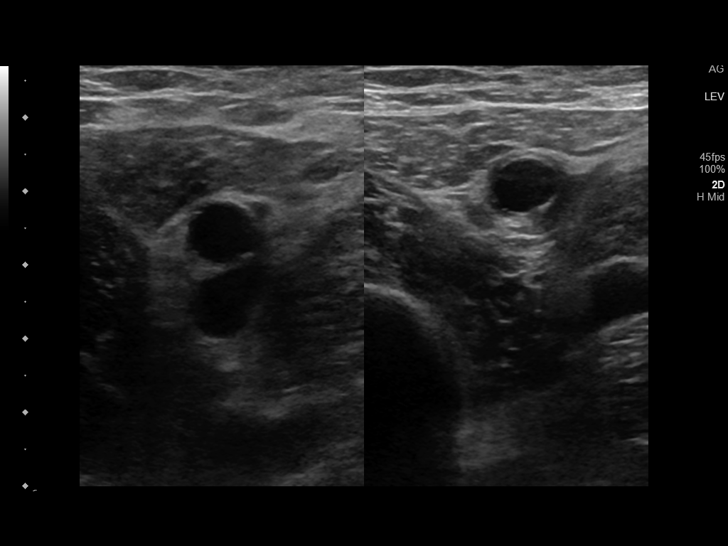
[im 17/32]
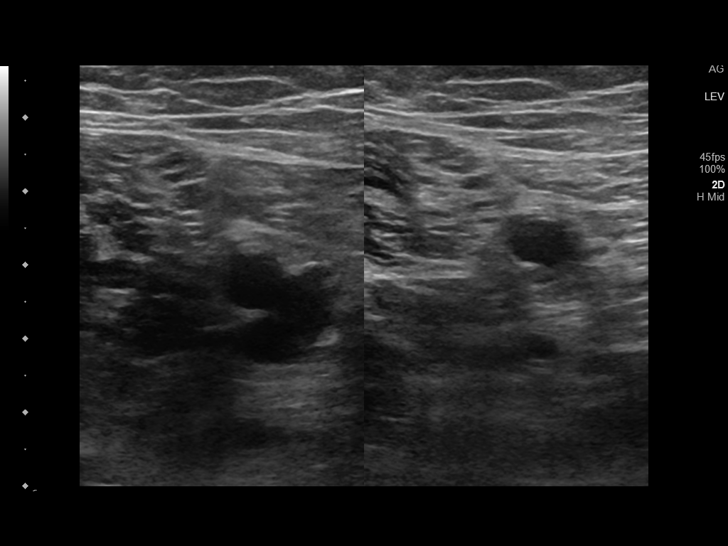
[im 18/32]
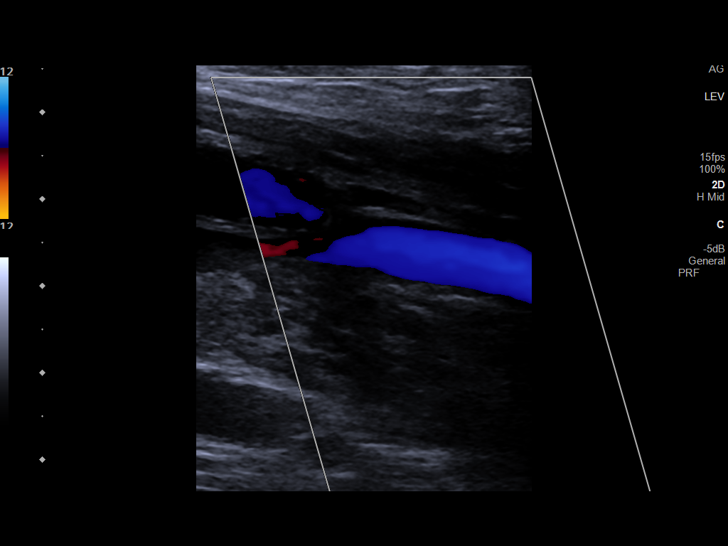
[im 21/32]
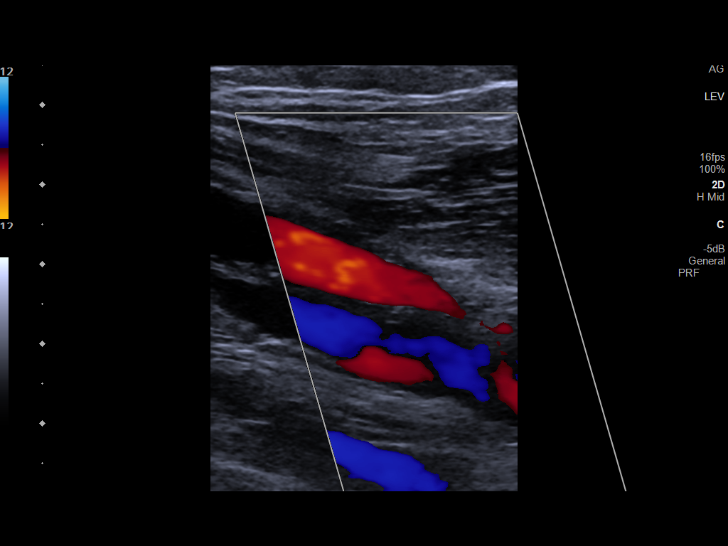
[im 23/32]
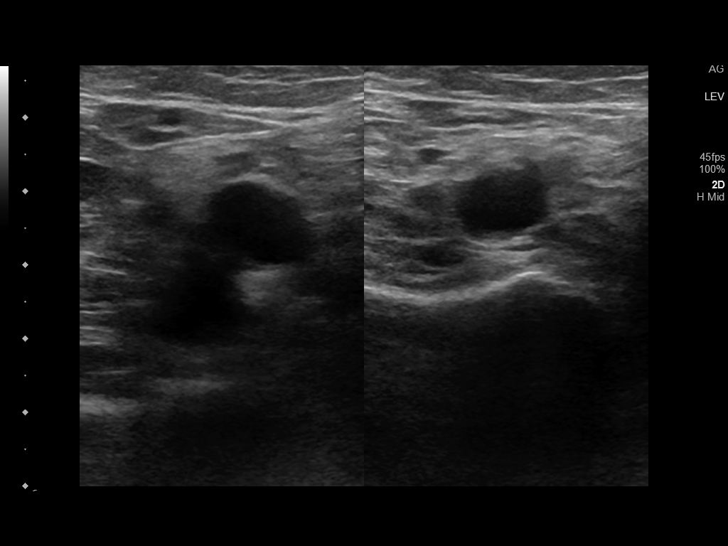
[im 26/32]
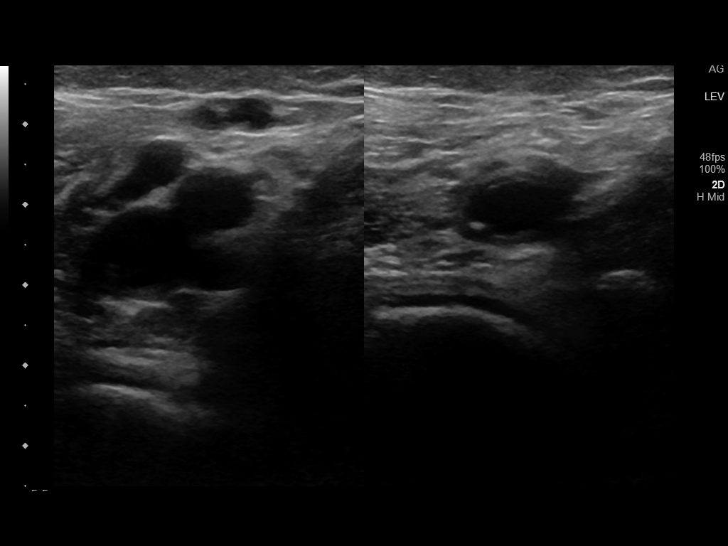
[im 29/32]
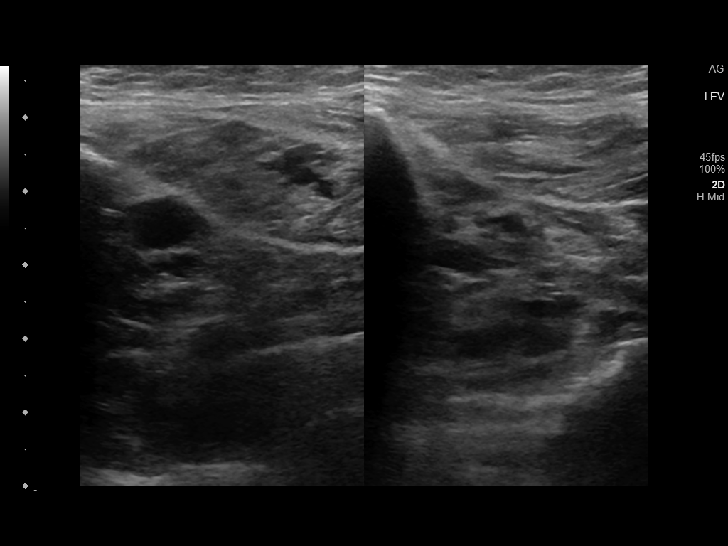
[im 32/32]
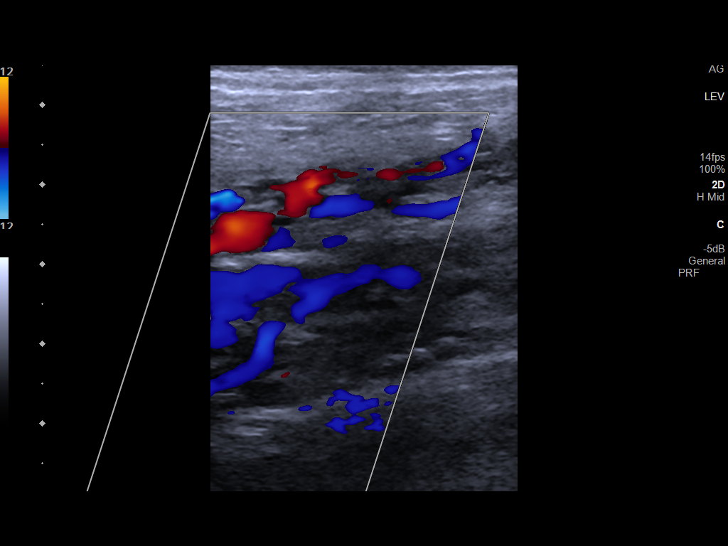

[13 of 24 positions shown; findings below may reference images not displayed]

FINDINGS: Contralateral Common Femoral Vein: Respiratory phasicity is normal
and symmetric with the symptomatic side. No evidence of thrombus.
Normal compressibility.

Common Femoral Vein: No evidence of thrombus. Normal
compressibility, respiratory phasicity and response to augmentation.

Saphenofemoral Junction: No evidence of thrombus. Normal
compressibility and flow on color Doppler imaging.

Profunda Femoral Vein: No evidence of thrombus. Normal
compressibility and flow on color Doppler imaging.

Femoral Vein: No evidence of thrombus. Normal compressibility,
respiratory phasicity and response to augmentation.

Popliteal Vein: No evidence of thrombus. Normal compressibility,
respiratory phasicity and response to augmentation.

Calf Veins: No evidence of thrombus. Normal compressibility and flow
on color Doppler imaging.

Other Findings:  None.
IMPRESSION: No findings of acute or chronic right lower extremity deep venous
thrombosis.

## 2021-07-06 MED ORDER — ACETAMINOPHEN 500 MG PO TABS
1000.0000 mg | ORAL_TABLET | Freq: Once | ORAL | Status: AC
  Administered 2021-07-06: 1000 mg via ORAL
  Filled 2021-07-06: qty 2

## 2021-07-06 MED ORDER — SIMVASTATIN 20 MG PO TABS
20.0000 mg | ORAL_TABLET | Freq: Every day | ORAL | Status: DC
  Administered 2021-07-06 – 2021-07-09 (×4): 20 mg via ORAL
  Filled 2021-07-06: qty 2
  Filled 2021-07-06 (×2): qty 1
  Filled 2021-07-06: qty 2

## 2021-07-06 MED ORDER — OXYCODONE HCL 5 MG PO TABS
5.0000 mg | ORAL_TABLET | ORAL | Status: DC | PRN
  Administered 2021-07-06: 5 mg via ORAL
  Filled 2021-07-06: qty 1

## 2021-07-06 MED ORDER — VANCOMYCIN HCL IN DEXTROSE 1-5 GM/200ML-% IV SOLN
1000.0000 mg | Freq: Once | INTRAVENOUS | Status: AC
  Administered 2021-07-06: 1000 mg via INTRAVENOUS
  Filled 2021-07-06: qty 200

## 2021-07-06 MED ORDER — FERROUS SULFATE 325 (65 FE) MG PO TABS
325.0000 mg | ORAL_TABLET | Freq: Every day | ORAL | Status: DC
  Administered 2021-07-06 – 2021-07-10 (×4): 325 mg via ORAL
  Filled 2021-07-06 (×4): qty 1

## 2021-07-06 MED ORDER — VANCOMYCIN HCL 750 MG/150ML IV SOLN
750.0000 mg | INTRAVENOUS | Status: DC
  Administered 2021-07-07: 750 mg via INTRAVENOUS
  Filled 2021-07-06 (×3): qty 150

## 2021-07-06 MED ORDER — GADOBUTROL 1 MMOL/ML IV SOLN
10.0000 mL | Freq: Once | INTRAVENOUS | Status: AC | PRN
  Administered 2021-07-06: 10 mL via INTRAVENOUS

## 2021-07-06 MED ORDER — ONDANSETRON HCL 4 MG/2ML IJ SOLN
4.0000 mg | Freq: Once | INTRAMUSCULAR | Status: AC
  Administered 2021-07-06: 4 mg via INTRAVENOUS
  Filled 2021-07-06: qty 2

## 2021-07-06 MED ORDER — PANTOPRAZOLE SODIUM 40 MG PO TBEC: 40.0000 mg | DELAYED_RELEASE_TABLET | Freq: Every day | ORAL | Status: DC | PRN

## 2021-07-06 MED ORDER — ERYTHROMYCIN 5 MG/GM OP OINT
1.0000 "application " | TOPICAL_OINTMENT | Freq: Three times a day (TID) | OPHTHALMIC | Status: AC
  Administered 2021-07-06 – 2021-07-08 (×6): 1 via OPHTHALMIC
  Filled 2021-07-06 (×3): qty 1

## 2021-07-06 MED ORDER — ONDANSETRON HCL 4 MG/2ML IJ SOLN: 4.0000 mg | Freq: Four times a day (QID) | INTRAMUSCULAR | Status: DC | PRN

## 2021-07-06 MED ORDER — SODIUM CHLORIDE 0.9 % IV SOLN
2.0000 g | Freq: Once | INTRAVENOUS | Status: AC
  Administered 2021-07-06: 2 g via INTRAVENOUS
  Filled 2021-07-06: qty 2

## 2021-07-06 MED ORDER — ACETAMINOPHEN 325 MG PO TABS
650.0000 mg | ORAL_TABLET | Freq: Four times a day (QID) | ORAL | Status: DC | PRN
  Administered 2021-07-06 – 2021-07-08 (×3): 650 mg via ORAL
  Filled 2021-07-06 (×3): qty 2

## 2021-07-06 MED ORDER — MORPHINE SULFATE (PF) 4 MG/ML IV SOLN
4.0000 mg | Freq: Once | INTRAVENOUS | Status: AC
  Administered 2021-07-06: 4 mg via INTRAVENOUS
  Filled 2021-07-06: qty 1

## 2021-07-06 MED ORDER — SODIUM CHLORIDE 0.9 % IV BOLUS
1000.0000 mL | Freq: Once | INTRAVENOUS | Status: AC
  Administered 2021-07-06: 1000 mL via INTRAVENOUS

## 2021-07-06 MED ORDER — POLYETHYLENE GLYCOL 3350 17 G PO PACK: 17.0000 g | PACK | Freq: Every day | ORAL | Status: DC | PRN

## 2021-07-06 MED ORDER — LACTATED RINGERS IV SOLN: INTRAVENOUS | Status: AC

## 2021-07-06 MED ORDER — MORPHINE SULFATE (PF) 4 MG/ML IV SOLN
4.0000 mg | Freq: Once | INTRAVENOUS | Status: AC
Start: 2021-07-06 — End: 2021-07-06
  Administered 2021-07-06: 4 mg via INTRAVENOUS
  Filled 2021-07-06: qty 1

## 2021-07-06 MED ORDER — OXYMETAZOLINE HCL 0.05 % NA SOLN
1.0000 | Freq: Once | NASAL | Status: AC
  Administered 2021-07-06: 1 via NASAL
  Filled 2021-07-06: qty 30

## 2021-07-06 MED ORDER — HYDROMORPHONE HCL 1 MG/ML IJ SOLN
0.5000 mg | INTRAMUSCULAR | Status: DC | PRN
  Administered 2021-07-06: 0.5 mg via INTRAVENOUS
  Filled 2021-07-06: qty 1

## 2021-07-06 MED ORDER — SODIUM CHLORIDE 0.9 % IV SOLN
2.0000 g | Freq: Two times a day (BID) | INTRAVENOUS | Status: AC
  Administered 2021-07-07 – 2021-07-08 (×4): 2 g via INTRAVENOUS
  Filled 2021-07-06 (×5): qty 2

## 2021-07-06 MED ORDER — RIVAROXABAN 20 MG PO TABS: 20.0000 mg | ORAL_TABLET | Freq: Every day | ORAL | Status: DC

## 2021-07-06 MED ORDER — FLUTICASONE PROPIONATE 50 MCG/ACT NA SUSP
1.0000 | Freq: Every day | NASAL | Status: DC | PRN
  Filled 2021-07-06: qty 16

## 2021-07-06 NOTE — Telephone Encounter (Signed)
Wife called and stated that she just wanted to let you know that patient is in the ER at [facility] -  due to Painful leg.   FYI

## 2021-07-06 NOTE — H&P (Addendum)
History and Physical  Vincent Harper [id number] DOB: [birth year] DOA: 07/06/2021  Referring physician: Dr. Ellender Hose, [place]. PCP: Vincent Chandler, NP  Outpatient Specialists: Geriatric medicine, ophthalmology, GI, dermatology. Patient coming from: Home.  Chief Complaint: Right hip pain.  HPI: Vincent Harper is a 85 y.o. 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  Associated with intermittent fever with T-max of 101-102 at home.  He has been unable to put weight on this R hip without experiencing pain and discomfort.  Additionally endorses a productive cough with white mucus.  No chest pain.  Patient presents to the ED for further evaluation and management.  Upon arrival to the ED temperature 102, WBC 17,000.  He had an MRI of his right hip which revealed myofascial edema/inflammation around the right intertrochanteric femur which is nonspecific, per radiology report, but concerning for infection given leukocytosis, especially if there is no history of trauma.  No drainage fluid collection.  Trace right hip joint effusion without convincing evidence of septic arthritis.  No osteomyelitis.  Partial tear of the right gluteus medius tendon.  EDP received a call from the lab regarding evidence on blast cells on blood work.  Promptly hematology and orthopedic surgery were consulted by EDP.  Patient admitted by hospitalist service.  ED Course:  T-max 102.7.  BP 101/56, pulse 87, respiration rate 18, O2 saturation 87% on room air.  Lab studies remarkable for WBC 17.1, hemoglobin 10.4, MCV 101.6, platelet 148, monocyte count 9.41, eosinophil absolute 0.7.  Sed rate 106, CRP 10.0.  Serum bicarb 26,  glucose 120, BUN 28, creatinine 1.42, GFR 49.  LDH 161.  Uric acid 6.2.   Review of Systems: Review of systems as noted in the HPI. All other systems reviewed and are negative.   Past Medical History:  Diagnosis Date   History of colon polyps    Hypercholesteremia    Per [facility] New Patient Packet.   Hyperlipidemia    Hypertension    Per [facility] New Patient Packet.   Post-nasal drip    Per [place] Patient Packet.   Squamous cell carcinoma of skin 12/21/2020   Right mid helix, [facility]   Squamous cell carcinoma of skin 12/21/2020   in situ, [place] lower lip, needs appt for LN2 followed by 5FU   Squamous cell carcinoma of skin 02/10/2021   right dorsum hand, [facility]    Past Surgical History:  Procedure Laterality Date   COLONOSCOPY  11/28/2016   [facility], Per [facility] New Patient Packet.   EAR BIOPSY     and lower lip   HERNIA REPAIR     umbilical and inguinal (unsure what side) hernia   NOSE SURGERY     [place], Per [facility] New Patient Packet.   TONSILLECTOMY     Per [facility] New Patient Packet.    Social History:  reports that he has never smoked. He has never used smokeless tobacco. He reports current alcohol use of about 1.0 standard drink of alcohol per week. He reports that he does not use drugs.   No Known Allergies  Family History  Problem Relation Age of Onset   Hypertension Mother    Dementia Father    Diabetes Daughter       Prior  to Admission medications   Medication Sig Start Date End Date Taking? Authorizing Provider  enalapril (VASOTEC) 20 MG tablet TAKE 1 TABLET(20 MG) BY MOUTH EVERY MORNING 02/12/21   Vincent Chandler, NP  erythromycin ophthalmic ointment Place 1 application into both eyes 3 (three) times daily. Instill ~1 cm ribbon into both eyes three times daily for 1 week 07/01/21   Vincent Chandler, NP  fluorouracil (EFUDEX) 5 % cream Apply to lower lip twice a day x 1-2 weeks as directed. 02/10/21   [name], MD  fluticasone [facility]) 50 MCG/ACT nasal spray Place 1  spray into both nostrils daily as needed for allergies or rhinitis. 12/26/19   Vincent Chandler, NP  guaiFENesin-Codeine (ROBAFEN AC PO) Take 5 mLs by mouth as needed. As needed for cough.    [provider]  hydrochlorothiazide (HYDRODIURIL) 25 MG tablet Take 1 tablet (25 mg total) by mouth daily. 10/13/20   Vincent Chandler, NP  Iron, Ferrous Sulfate, 325 (65 Fe) MG TABS Take 325 mg by mouth daily. 06/30/21   Vincent Chandler, NP  pantoprazole (PROTONIX) 40 MG tablet TAKE 1 TABLET(40 MG) BY MOUTH DAILY 06/30/21   Vincent Chandler, NP  sildenafil (VIAGRA) 100 MG tablet Take 1 tablet (100 mg total) by mouth daily as needed for erectile dysfunction. 03/12/20   Vincent Chandler, NP  simvastatin (ZOCOR) 20 MG tablet TAKE 1 TABLET(20 MG) BY MOUTH AT BEDTIME 05/10/21   Vincent Chandler, NP  UNABLE TO FIND Saline Nose Spray As Needed.    [provider]  Undecylenic Ac-Zn Undecylenate (FUNGI-NAIL TOE & FOOT EX) Apply topically as needed. For Toe Nail Fungus.    [provider]  XARELTO 20 MG TABS tablet TAKE 1 TABLET(20 MG) BY MOUTH AT BEDTIME 02/23/21   Vincent Chandler, NP    Physical Exam: BP (!) 101/56   Pulse 80   Temp (!) 102.7 F (39.3 C) (Rectal)   Resp 18   SpO2 93%   General: 85 y.o. year-old male well developed well nourished in no acute distress.  Alert and oriented x3. Cardiovascular: Regular rate and rhythm with no rubs or gallops.  No thyromegaly or JVD noted.  No lower extremity edema. 2/4 pulses in all 4 extremities. Respiratory: Mild rales at bases with no wheezes. Good inspiratory effort. Abdomen: Soft nontender nondistended with normal bowel sounds x4 quadrants. Muskuloskeletal: No cyanosis, clubbing or edema noted bilaterally Neuro: CN II-XII intact, strength, sensation, reflexes Skin: No ulcerative lesions noted or rashes Psychiatry: Judgement and insight appear normal. Mood is appropriate for condition and setting          Labs on  Admission:  Basic Metabolic Panel: Recent Labs  Lab 07/06/21 0717 07/06/21 1243  NA 137  --   K 3.7  --   CL 103  --   CO2 26  --   GLUCOSE 120*  --   BUN 28*  --   CREATININE 1.42*  --   CALCIUM 8.4*  --   PHOS  --  2.8   Liver Function Tests: Recent Labs  Lab 07/06/21 0717  AST 16  ALT 12  ALKPHOS 52  BILITOT 1.0  PROT 8.8*  ALBUMIN 2.7*   No results for input(s): LIPASE, AMYLASE in the last 168 hours. No results for input(s): AMMONIA in the last 168 hours. CBC: Recent Labs  Lab 07/06/21 0717  WBC 17.1*  NEUTROABS 4.6  HGB 10.4*  HCT 32.1*  MCV 101.6*  PLT 148*   Cardiac Enzymes: No results for input(s): CKTOTAL, CKMB, CKMBINDEX, TROPONINI in the last 168 hours.  BNP (last 3 results) Recent Labs    07/06/21 0717  BNP 96.5    ProBNP (last 3 results) No results for input(s): PROBNP in the last 8760 hours.  CBG: No results for input(s): GLUCAP in the last 168 hours.  Radiological Exams on Admission: DG Lumbar Spine Complete  Result Date: 07/06/2021 CLINICAL DATA:  Right groin pain. EXAM: LUMBAR SPINE - COMPLETE 4+ VIEW COMPARISON:  None. FINDINGS: Mild grade 1 retrolisthesis of L2-3 is noted secondary to severe degenerative disc disease at this level. No fracture is noted. Moderate degenerative disc disease is also noted at L4-5 and L5-S1. Degenerative changes are seen involving posterior facet joints of L5-S1. IMPRESSION: Multilevel degenerative disc disease. No acute abnormality is noted. Aortic Atherosclerosis (ICD10-I70.0). Electronically Signed   By: Marijo Conception M.D.   On: 07/06/2021 08:48   MR Lumbar Spine W Wo Contrast  Result Date: 07/06/2021 CLINICAL DATA:  Lumbar radiculopathy, cancer or infection suspected EXAM: MRI LUMBAR SPINE WITHOUT AND WITH CONTRAST TECHNIQUE: Multiplanar and multiecho pulse sequences of the lumbar spine were obtained without and with intravenous contrast. CONTRAST:  97m GADAVIST GADOBUTROL 1 MMOL/ML IV SOLN COMPARISON:   Lumbar radiographs from the same day. FINDINGS: Segmentation: Standard segmentation is assumed with the inferior-most fully formed intervertebral disc labeled L5-S1. Alignment: Grade 1 retrolisthesis of L2 on L3 and grade 1 anterolisthesis of L5 on S1. Slight retrolisthesis of L3 on L4 and L4 on L5. Dextrocurvature of the upper lumbar spine with levocurvature of the lower lumbar spine. Vertebrae: Degenerative/discogenic endplate signal changes about the left L2-L3 and L5-S1 and right L4-L5 disc spaces, where there is disc space narrowing. Otherwise, no focal marrow edema to suggest acute fracture or discitis/osteomyelitis. Mild disc edema and enhancement at the sites of degenerative disc space narrowing described above. Mildly heterogeneous marrow without suspicious bone lesion. Conus medullaris and cauda equina: Conus extends to the L1-L2 level. Conus appears normal. No abnormal enhancement of the conus or cauda equina. Fatty filum terminalis. Paraspinal and other soft tissues: Bilateral renal cysts. No appreciable paraspinal edema. Disc levels: T11-T12: Only imaged sagittally without evidence of significant canal or foraminal stenosis. T12-L1: No significant disc protrusion, foraminal stenosis, or canal stenosis. L1-L2: Mild disc bulging and facet arthropathy without significant canal or foraminal stenosis. L2-L3: Left eccentric degenerative disc disease. Grade 1 retrolisthesis of L2 on L3. Disc bulging, including biforaminal disc protrusions. Mild to moderate right greater than left foraminal stenosis. Mild bilateral subarticular recess narrowing without significant canal stenosis. L3-L4: Slight retrolisthesis of L3 on L4. Disc bulging, including biforaminal disc protrusions. Moderate bilateral facet hypertrophy. Resulting mild to moderate right greater than left foraminal stenosis. Mild canal stenosis and right greater than left subarticular recess stenosis L4-L5: Right eccentric degenerative disc disease.  Slight retrolisthesis of L4 on L5. Disc bulging, including biforaminal disc protrusions, and ligamentum flavum thickening. Resulting moderate bilateral foraminal stenosis. Mild canal stenosis with right greater than left subarticular recess stenosis. L5-S1: Grade 1 anterolisthesis of L5 on S1. Broad disc bulge with moderate bilateral facet hypertrophy. Resulting moderate left and mild right foraminal stenosis. IMPRESSION: 1. Moderate foraminal stenosis bilaterally at L4-L5 and on the left at L5-S1. Mild-to-moderate right greater than left foraminal stenosis at L2-L3 and L3-L4. 2. Mild canal stenosis at L3-L4 and L4-L5 with right greater than left subarticular recess narrowing. 3. Degenerative disc disease greatest at L2-L3, L4-L5 and L5-S1,  detailed above. Electronically Signed   By: Margaretha Sheffield MD   On: 07/06/2021 12:23   MR HIP RIGHT W WO CONTRAST  Result Date: 07/06/2021 CLINICAL DATA:  Right groin pain. EXAM: MRI OF THE RIGHT HIP WITHOUT AND WITH CONTRAST TECHNIQUE: Multiplanar, multisequence MR imaging was performed both before and after administration of intravenous contrast. CONTRAST:  39m GADAVIST GADOBUTROL 1 MMOL/ML IV SOLN COMPARISON:  Right hip x-rays from same day. FINDINGS: Bones: There is no evidence of acute fracture, dislocation or avascular necrosis. No focal bone lesion. The visualized sacroiliac joints and symphysis pubis appear normal. Articular cartilage and labrum Articular cartilage: No focal chondral defect or subchondral signal abnormality identified. Labrum: Grossly intact, although evaluation is limited due to lack of intra-articular fluid. No paralabral abnormality. Joint or bursal effusion Joint effusion: Trace right hip joint effusion. Bursae: No focal periarticular fluid collection. Muscles and tendons Muscles and tendons: Partial tear of the right gluteus medius tendon (series 8, image 12). The bilateral hamstring and iliopsoas tendons appear normal. Mild edema and  enhancement in the proximal vastus lateralis muscle. More prominent myofascial edema and enhancement around the lesser trochanter. Other findings Miscellaneous: Severe colonic diverticulosis. Bilateral renal cysts. IMPRESSION: 1. Myofascial edema/inflammation around the right intertrochanteric femur is nonspecific, but concerning for infection given leukocytosis, especially if there is no history of trauma. No drainable fluid collection. 2. Trace right hip joint effusion without convincing evidence of septic arthritis. No osteomyelitis. 3. Partial tear of the right gluteus medius tendon. Electronically Signed   By: WTitus DubinM.D.   On: 07/06/2021 12:39   UKoreaVenous Img Lower Unilateral Right  Result Date: 07/06/2021 CLINICAL DATA:  Right lower extremity pain 3-4 weeks, history of positive DVT in 2016 EXAM: RIGHT LOWER EXTREMITY VENOUS DOPPLER ULTRASOUND TECHNIQUE: Gray-scale sonography with graded compression, as well as color Doppler and duplex ultrasound were performed to evaluate the lower extremity deep venous systems from the level of the common femoral vein and including the common femoral, femoral, profunda femoral, popliteal and calf veins including the posterior tibial, peroneal and gastrocnemius veins when visible. The superficial great saphenous vein was also interrogated. Spectral Doppler was utilized to evaluate flow at rest and with distal augmentation maneuvers in the common femoral, femoral and popliteal veins. COMPARISON:  March 2016 FINDINGS: Contralateral Common Femoral Vein: Respiratory phasicity is normal and symmetric with the symptomatic side. No evidence of thrombus. Normal compressibility. Common Femoral Vein: No evidence of thrombus. Normal compressibility, respiratory phasicity and response to augmentation. Saphenofemoral Junction: No evidence of thrombus. Normal compressibility and flow on color Doppler imaging. Profunda Femoral Vein: No evidence of thrombus. Normal  compressibility and flow on color Doppler imaging. Femoral Vein: No evidence of thrombus. Normal compressibility, respiratory phasicity and response to augmentation. Popliteal Vein: No evidence of thrombus. Normal compressibility, respiratory phasicity and response to augmentation. Calf Veins: No evidence of thrombus. Normal compressibility and flow on color Doppler imaging. Other Findings:  None. IMPRESSION: No findings of acute or chronic right lower extremity deep venous thrombosis. Electronically Signed   By: YAlbin FellingMD   On: 07/06/2021 08:46   DG Hip Unilat W or Wo Pelvis 2-3 Views Right  Result Date: 07/06/2021 CLINICAL DATA:  Right hip and groin pain EXAM: DG HIP (WITH OR WITHOUT PELVIS) 2-3V RIGHT COMPARISON:  None. FINDINGS: There is no evidence of hip fracture or dislocation. Mild hip joint space narrowing. Partially visualized lower lumbar spondylosis. IMPRESSION: Mild degenerative changes of the right hip.  No acute  findings. Electronically Signed   By: Davina Poke D.O.   On: 07/06/2021 08:46    EKG: I independently viewed the EKG done and my findings are as followed: None available at the time of dictation.  Assessment/Plan Present on Admission:  (Resolved) Left hip pain  Right hip pain  Active Problems:   Right hip pain  Right hip pain, rule out infective versus inflammatory process. MRI right hip reveals findings as stated above in the HPI. Orthopedic surgery has been consulted, awaiting recommendations. Analgesics as needed Bedrest for now until further recommendations from orthopedic surgery.  Partial tear of the right gluteus medius tendon.  Analgesics PRN Ortho will see in consultation for further recommendations  Incidentally found blast cells This was reported by lab to EDP. Hematology has been consulted for further management.  Sepsis of unclear etiology UA is negative for pyuria Obtain stat chest x-ray in light of hypoxia  Acute hypoxic respiratory  failure, rule out pneumonia Not on oxygen supplementation at baseline Obtain stat chest x-ray Patient has been started on broad-spectrum IV antibiotics, continue for now Obtain procalcitonin level in the morning Maintain O2 saturation greater than 90%.  AKI, suspect prerenal in the setting of dehydration from poor oral intake Baseline creatinine average appears to be 1.0 Presented with creatinine of 1.42 with GFR 49 Start gentle IV fluid hydration Avoid nephrotoxic agent, dehydration or hypotension. Monitor urine output Repeat renal panel in the morning.  Macrocytic anemia Presented with hemoglobin of 10.4, baseline hemoglobin 13 MCV 101 Continue to closely monitor H&H Transfuse when indicated.  Mild thrombocytopenia Presented with platelet count of 148 Monitor for now.  History of right lower extremity DVT Resume home Xarelto   DVT prophylaxis: Xarelto  Code Status: Full code  Family Communication: Daughter at bedside.   Disposition Plan: Admit to progressive unit.  Consults called: Hematology/oncology, orthopedic surgery.  Admission status: Inpatient status.  Patient will require at least 2 midnights for further evaluation and treatment of present condition.   Status is: Inpatient  Dispo:  Patient From: Home  Planned Disposition: Home, when hematology/oncology and orthopedic surgery sign off.  Medically stable for discharge: No         Kayleen Memos MD [facility] Pager [phone]  If 7PM-7AM, please contact night-coverage [email or web address] Password [email or web address]  07/06/2021, 1:45 PM

## 2021-07-06 NOTE — ED Notes (Signed)
Pt given phone to speak with MRI

## 2021-07-06 NOTE — ED Triage Notes (Signed)
Pt is c/o  right Right Groin pain . Pt states that he has had DVTs in that leg 8 yrs ago and is on blood thinners . Says its painful to walk on   Pt appears stable vitals as noted in chart ABCS appear WNL

## 2021-07-06 NOTE — ED Notes (Signed)
Patient transported to X-ray 

## 2021-07-06 NOTE — Consult Note (Signed)
ORTHOPAEDIC CONSULTATION  REQUESTING PHYSICIAN: Kayleen Memos, DO  Chief Complaint:   Right hip pain  History of Present Illness: Vincent Harper is a 85 y.o. male otherwise healthy and independent patient with a history of hypertension, hyperlipidemia, hypercholesterolemia who lives at [facility], [place]  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  Over the past several weeks, the patient has noted intermittent fevers and gradually worsening right hip pain.  Apparently, he was treated with a course of oral antibiotics for bacterial conjunctivitis and bronchitis but this did not alleviate his fevers.  Because of worsening right hip pain and inability to bear weight on his right leg, he presented to emergency room earlier today.  Work-up in the emergency room has demonstrated multiple abnormalities including a fever to 102.7, O2 saturations of 87% on room air, and elevated white count at 17.1 with 55% monocytes and only 27% neutrophils, and elevated sed rate and C-reactive protein.  The patient is being admitted at this time for further work-up of his fevers of unknown origin, including evaluation by Heme-Onc.  I have been asked to evaluate him for his right hip pain.  Past Medical History:  Diagnosis Date   History of colon polyps    Hypercholesteremia    Per [facility] New Patient Packet.   Hyperlipidemia    Hypertension    Per [facility] New Patient Packet.   Post-nasal drip    Per [facility] Patient Packet.   Squamous cell carcinoma of skin 12/21/2020   Right mid helix, [facility]   Squamous cell carcinoma of skin 12/21/2020   in situ, Central lower lip, needs appt for LN2 followed by 5FU   Squamous cell carcinoma of skin 02/10/2021   right dorsum hand, [facility]    Past Surgical History:  Procedure Laterality Date   COLONOSCOPY  11/28/2016   [facility], Per [facility] New Patient Packet.   EAR BIOPSY      and lower lip   HERNIA REPAIR     umbilical and inguinal (unsure what side) hernia   NOSE SURGERY     [place], Per [facility] New Patient Packet.   TONSILLECTOMY     Per [facility] New Patient Packet.   Social History   Socioeconomic History   Marital status: Married    Spouse name: Shirly Moresi   Number of children: 3   Years of education: Not on file   Highest education level: Not on file  Occupational History   Not on file  Tobacco Use   Smoking status: Never   Smokeless tobacco: Never  Vaping Use   Vaping Use: Never used  Substance and Sexual Activity   Alcohol use: Yes    Alcohol/week: 1.0 standard drink    Types: 1 Cans of beer per week    Comment: 1 Beer Every 6 months.    Drug use: No   Sexual activity: Yes  Other Topics Concern   Not on file  Social History Narrative   Tobacco use, amount per day now: None   Past tobacco use, amount per day: None   How many years did you use tobacco: None   Alcohol use (drinks per week): 1 Beer every 6 months.   Diet:   Do you drink/eat things with caffeine: Diet Pepsi   Marital status: Married  What year were you married? 1964   Do you live in a house, apartment, assisted living, condo, trailer, etc.? Villa in [facility].   Is it one or more stories? 1   How many persons live in your home? 2   Do you have pets in your home?( please list) No   Highest Level of education completed: College   Current or past profession:    Do you exercise? Yes                                   Type and how often? Walk Daily.   Do you have a living will? Yes in 2020   Do you have a DNR form? No                                  If not, do you want to discuss one?   Do you have signed POA/HPOA forms? Yes in 2020                        If so, please bring to you appointment    Do you have any difficulty bathing or dressing yourself? No    Do you have difficulty preparing food or eating? No   Do you have  difficulty managing your medications? No   Do you have any difficulty managing your finances? No   Do you have any difficulty affording your medications? No      Social Determinants of Radio broadcast assistant Strain: Not on file  Food Insecurity: Not on file  Transportation Needs: Not on file  Physical Activity: Not on file  Stress: Not on file  Social Connections: Not on file   Family History  Problem Relation Age of Onset   Hypertension Mother    Dementia Father    Diabetes Daughter    No Known Allergies Prior to Admission medications   Medication Sig Start Date End Date Taking? Authorizing Provider  acetaminophen (TYLENOL) 500 MG tablet Take 500-1,000 mg by mouth every 6 (six) hours as needed for mild pain.   Yes [provider]  enalapril (VASOTEC) 20 MG tablet TAKE 1 TABLET(20 MG) BY MOUTH EVERY MORNING 02/12/21  Yes [name], NP  erythromycin ophthalmic ointment Place 1 application into both eyes 3 (three) times daily. Instill ~1 cm ribbon into both eyes three times daily for 1 week 07/01/21  Yes Eubanks, Carlos American, NP  fluticasone (FLONASE) 50 MCG/ACT nasal spray Place 1 spray into both nostrils daily as needed for allergies or rhinitis. 12/26/19  Yes [name], NP  Iron, Ferrous Sulfate, 325 (65 Fe) MG TABS Take 325 mg by mouth daily. 06/30/21  Yes [name], NP  pantoprazole (PROTONIX) 40 MG tablet TAKE 1 TABLET(40 MG) BY MOUTH DAILY Patient taking differently: Take 40 mg by mouth daily as needed (acid reflux symptoms). 06/30/21  Yes [name], NP  sildenafil (VIAGRA) 100 MG tablet Take 1 tablet (100 mg total) by mouth daily as needed for erectile dysfunction. 03/12/20  Yes [name], NP  simvastatin (ZOCOR) 20 MG tablet TAKE 1 TABLET(20 MG) BY MOUTH AT BEDTIME 05/10/21  Yes [name], NP  Undecylenic Ac-Zn Undecylenate (FUNGI-NAIL TOE & FOOT EX) Apply 1 application topically as needed (toenail fungus).   Yes  [provider]  XARELTO 20 MG TABS tablet TAKE 1 TABLET(20 MG) BY MOUTH AT BEDTIME 02/23/21  Yes [name], NP  fluorouracil (EFUDEX) 5 % cream Apply to lower lip twice a day x 1-2 weeks as directed. Patient not taking: No sig reported 02/10/21   [name], MD  hydrochlorothiazide (HYDRODIURIL) 25 MG tablet Take 1 tablet (25 mg total) by mouth daily. Patient not taking: No sig reported 10/13/20   [name], NP   DG Lumbar Spine Complete  Result Date: 07/06/2021 CLINICAL DATA:  Right groin pain. EXAM: LUMBAR SPINE - COMPLETE 4+ VIEW COMPARISON:  None. FINDINGS: Mild grade 1 retrolisthesis of L2-3 is noted secondary to severe degenerative disc disease at this level. No fracture is noted. Moderate degenerative disc disease is also noted at L4-5 and L5-S1. Degenerative changes are seen involving posterior facet joints of L5-S1. IMPRESSION: Multilevel degenerative disc disease. No acute abnormality is noted. Aortic Atherosclerosis (ICD10-I70.0). Electronically Signed   By: Marijo Conception M.D.   On: 07/06/2021 08:48   MR Lumbar Spine W Wo Contrast  Result Date: 07/06/2021 CLINICAL DATA:  Lumbar radiculopathy, cancer or infection suspected EXAM: MRI LUMBAR SPINE WITHOUT AND WITH CONTRAST TECHNIQUE: Multiplanar and multiecho pulse sequences of the lumbar spine were obtained without and with intravenous contrast. CONTRAST:  40m GADAVIST GADOBUTROL 1 MMOL/ML IV SOLN COMPARISON:  Lumbar radiographs from the same day. FINDINGS: Segmentation: Standard segmentation is assumed with the inferior-most fully formed intervertebral disc labeled L5-S1. Alignment: Grade 1 retrolisthesis of L2 on L3 and grade 1 anterolisthesis of L5 on S1. Slight retrolisthesis of L3 on L4 and L4 on L5. Dextrocurvature of the upper lumbar spine with levocurvature of the lower lumbar spine. Vertebrae: Degenerative/discogenic endplate signal changes about the left L2-L3 and L5-S1 and right L4-L5 disc spaces, where there is  disc space narrowing. Otherwise, no focal marrow edema to suggest acute fracture or discitis/osteomyelitis. Mild disc edema and enhancement at the sites of degenerative disc space narrowing described above. Mildly heterogeneous marrow without suspicious bone lesion. Conus medullaris and cauda equina: Conus extends to the L1-L2 level. Conus appears normal. No abnormal enhancement of the conus or cauda equina. Fatty filum terminalis. Paraspinal and other soft tissues: Bilateral renal cysts. No appreciable paraspinal edema. Disc levels: T11-T12: Only imaged sagittally without evidence of significant canal or foraminal stenosis. T12-L1: No significant disc protrusion, foraminal stenosis, or canal stenosis. L1-L2: Mild disc bulging and facet arthropathy without significant canal or foraminal stenosis. L2-L3: Left eccentric degenerative disc disease. Grade 1 retrolisthesis of L2 on L3. Disc bulging, including biforaminal disc protrusions. Mild to moderate right greater than left foraminal stenosis. Mild bilateral subarticular recess narrowing without significant canal stenosis. L3-L4: Slight retrolisthesis of L3 on L4. Disc bulging, including biforaminal disc protrusions. Moderate bilateral facet hypertrophy. Resulting mild to moderate right greater than left foraminal stenosis. Mild canal stenosis and right greater than left subarticular recess stenosis L4-L5: Right eccentric degenerative disc disease. Slight retrolisthesis of L4 on L5. Disc bulging, including biforaminal disc protrusions, and ligamentum flavum thickening. Resulting moderate bilateral foraminal stenosis. Mild canal stenosis with right greater than left subarticular recess stenosis. L5-S1: Grade 1 anterolisthesis of L5 on S1. Broad disc bulge with moderate bilateral facet hypertrophy. Resulting moderate left and mild right foraminal stenosis. IMPRESSION: 1. Moderate foraminal stenosis bilaterally at L4-L5 and on the left at L5-S1. Mild-to-moderate right  greater than left foraminal stenosis at L2-L3 and L3-L4. 2. Mild canal stenosis at L3-L4 and L4-L5 with  right greater than left subarticular recess narrowing. 3. Degenerative disc disease greatest at L2-L3, L4-L5 and L5-S1, detailed above. Electronically Signed   By: Margaretha Sheffield MD   On: 07/06/2021 12:23   MR HIP RIGHT W WO CONTRAST  Result Date: 07/06/2021 CLINICAL DATA:  Right groin pain. EXAM: MRI OF THE RIGHT HIP WITHOUT AND WITH CONTRAST TECHNIQUE: Multiplanar, multisequence MR imaging was performed both before and after administration of intravenous contrast. CONTRAST:  36m GADAVIST GADOBUTROL 1 MMOL/ML IV SOLN COMPARISON:  Right hip x-rays from same day. FINDINGS: Bones: There is no evidence of acute fracture, dislocation or avascular necrosis. No focal bone lesion. The visualized sacroiliac joints and symphysis pubis appear normal. Articular cartilage and labrum Articular cartilage: No focal chondral defect or subchondral signal abnormality identified. Labrum: Grossly intact, although evaluation is limited due to lack of intra-articular fluid. No paralabral abnormality. Joint or bursal effusion Joint effusion: Trace right hip joint effusion. Bursae: No focal periarticular fluid collection. Muscles and tendons Muscles and tendons: Partial tear of the right gluteus medius tendon (series 8, image 12). The bilateral hamstring and iliopsoas tendons appear normal. Mild edema and enhancement in the proximal vastus lateralis muscle. More prominent myofascial edema and enhancement around the lesser trochanter. Other findings Miscellaneous: Severe colonic diverticulosis. Bilateral renal cysts. IMPRESSION: 1. Myofascial edema/inflammation around the right intertrochanteric femur is nonspecific, but concerning for infection given leukocytosis, especially if there is no history of trauma. No drainable fluid collection. 2. Trace right hip joint effusion without convincing evidence of septic arthritis. No  osteomyelitis. 3. Partial tear of the right gluteus medius tendon. Electronically Signed   By: WTitus DubinM.D.   On: 07/06/2021 12:39   [place]Venous Img Lower Unilateral Right  Result Date: 07/06/2021 CLINICAL DATA:  Right lower extremity pain 3-4 weeks, history of positive DVT in 2016 EXAM: RIGHT LOWER EXTREMITY VENOUS DOPPLER ULTRASOUND TECHNIQUE: Gray-scale sonography with graded compression, as well as color Doppler and duplex ultrasound were performed to evaluate the lower extremity deep venous systems from the level of the common femoral vein and including the common femoral, femoral, profunda femoral, popliteal and calf veins including the posterior tibial, peroneal and gastrocnemius veins when visible. The superficial great saphenous vein was also interrogated. Spectral Doppler was utilized to evaluate flow at rest and with distal augmentation maneuvers in the common femoral, femoral and popliteal veins. COMPARISON:  March 2016 FINDINGS: Contralateral Common Femoral Vein: Respiratory phasicity is normal and symmetric with the symptomatic side. No evidence of thrombus. Normal compressibility. Common Femoral Vein: No evidence of thrombus. Normal compressibility, respiratory phasicity and response to augmentation. Saphenofemoral Junction: No evidence of thrombus. Normal compressibility and flow on color Doppler imaging. Profunda Femoral Vein: No evidence of thrombus. Normal compressibility and flow on color Doppler imaging. Femoral Vein: No evidence of thrombus. Normal compressibility, respiratory phasicity and response to augmentation. Popliteal Vein: No evidence of thrombus. Normal compressibility, respiratory phasicity and response to augmentation. Calf Veins: No evidence of thrombus. Normal compressibility and flow on color Doppler imaging. Other Findings:  None. IMPRESSION: No findings of acute or chronic right lower extremity deep venous thrombosis. Electronically Signed   By: YAlbin FellingMD   On:  07/06/2021 08:46   DG Chest Port 1 View  Result Date: 07/06/2021 CLINICAL DATA:  Right groin pain. EXAM: PORTABLE CHEST 1 VIEW COMPARISON:  CT chest and chest x-ray dated February 19, 2012. FINDINGS: The heart size and mediastinal contours are within normal limits. Low lung volumes are present, causing  crowding of the pulmonary vasculature. Mild bibasilar atelectasis. No focal consolidation, pleural effusion, or pneumothorax. No acute osseous abnormality. IMPRESSION: 1. No active disease. Electronically Signed   By: Titus Dubin M.D.   On: 07/06/2021 14:54   DG Hip Unilat W or Wo Pelvis 2-3 Views Right  Result Date: 07/06/2021 CLINICAL DATA:  Right hip and groin pain EXAM: DG HIP (WITH OR WITHOUT PELVIS) 2-3V RIGHT COMPARISON:  None. FINDINGS: There is no evidence of hip fracture or dislocation. Mild hip joint space narrowing. Partially visualized lower lumbar spondylosis. IMPRESSION: Mild degenerative changes of the right hip.  No acute findings. Electronically Signed   By: Davina Poke D.O.   On: 07/06/2021 08:46    Positive ROS: All other systems have been reviewed and were otherwise negative with the exception of those mentioned in the HPI and as above.  Physical Exam: General:  Alert, no acute distress Psychiatric:  Patient is competent for consent with normal mood and affect   Cardiovascular:  No pedal edema Respiratory:  No wheezing, non-labored breathing GI:  Abdomen is soft and non-tender Skin:  No lesions in the area of chief complaint Neurologic:  Sensation intact distally Lymphatic:  No axillary or cervical lymphadenopathy  Orthopedic Exam:  Orthopedic examination is limited to the right hip and lower extremity.  Skin inspection around the right hip is unremarkable.  No swelling, erythema, ecchymosis, abrasions, or other skin abnormalities are identified.  He has only minimal tenderness to palpation around the anterior and lateral aspects of the right hip.  He is able to tolerate  gentle rolling with only mild hip discomfort.  He has more severe pain with any attempted active hip flexion, and is unable to perform an active straight leg raise.  He is able to generate 4-4+/5 strength with resisted abduction and resisted abduction while lying in the bed.  He is neurovascularly intact to the right lower extremity and foot.  X-rays:  Recent x-rays of the pelvis and right hip are available for review and have been reviewed by myself.  These films demonstrate no evidence for fractures, lytic lesions, or significant degenerative changes.  A recent MRI scan of the right hip also is available for review and has been reviewed by myself.  This study demonstrates increased fluid/edema involving the soft tissues around the intertrochanteric region of his right hip.  There is only a small effusion and no evidence for a septic hip or other loculated fluid collections.  The study also demonstrated evidence of a partial tear of the gluteus medius tendon.  No fractures or other bony abnormalities are identified.  Assessment: Acute onset of atraumatic right hip pain of unclear etiology.  Plan: The treatment options have been discussed with the patient and the daughter who is at the bedside.  There are several possible etiologies for his right hip pain, including an infectious etiology, as well as a traumatic etiology given his MRI finding of a partial gluteus medius tear.  Although the patient's fevers and history are concerning for a septic right hip, his MRI scan does not clearly demonstrate findings consistent with a septic hip.  With no significant effusion and no loculated fluid collections to consider draining, I do not feel there is a need for urgent surgical intervention at this time.  Therefore, I would defer initial treatment to the heme-onc team to better evaluate his increased white blood cell count with unusual differential associated with blasts in his blood, as this potentially may be  an  underlying cause of his fevers.  Meanwhile, he should be continued on his broad-spectrum IV antibiotics as a precaution.  As a precaution, I would recommend his Xarelto be held at this time.  If DVT prophylaxis is considered to be of high importance at this time despite his elevated PT and INR level, then he may be placed on Lovenox as this can be stopped quickly in case surgical intervention becomes necessary.  Thank you for asked me to participate in the care of this most delightful yet perplexing patient.  I will be happy to follow him with you closely.   Pascal Lux, MD  Beeper #:  [phone]  07/06/2021 5:57 PM

## 2021-07-06 NOTE — Consult Note (Signed)
Hematology/Oncology Consult note [facility] [phone] [phone]  Patient Care Team: [name], NP as PCP - General (Geriatric Medicine)   Name of the patient: Vincent Harper  [id number]  [birth year]    Reason for consult: Abnormal peripheral blood smear   Requesting physician: Dr. Ellender Hose  Date of visit: 07/06/2021    History of presenting illness-patient is a 85 year old male with a past medical history significant for right lower extremity DVT on Xarelto hypertension hyperlipidemia who presented to the ER with symptoms of intermittent fevers between 101 202.  He lives at home and has been having progressive pain in his right hip to the point that he has not been able to bear weight on it.  MRI of the right hip showed mild fascial edema/inflammation around the right intertrochanteric femur nonspecific but could be concerning for infection.He had a CBC with differential done in the ER which showed an elevated white cell count of 17 with an H&H of 10.4/32.1 with an MCV of 101.6 and a platelet count of 148.  A week ago his white count was normal at 10.6.  Differential mainly showed monocytosis with an absolute monocyte count of 9.4.  Peripheral smear review showed no evidence of microangiopathic hemolysis.  Marked monocytosis with predominantly mature monocytes.  Some monocytes show abnormal nuclear lobation.  Rare blasts.  Blood smear findings were consistent with a myeloid neoplasm differential includes CMML versus AML.  Oncology therefore consulted for further management  I met with patient and his daughter at the bedside today.  Patient currently reports ongoing right hip pain which has been going on for the last 2 to 3 weeks.  It has gotten to the point that he is unable to ambulate without having significant pain.  A month or 2 ago patient was independent of his ADLs and IADLs.  He reports that his appetite and weight have remained stable.   Denies any unintentional weight loss.  He has been having on and off low-grade fevers around 100 201 for the last 2 weeks.  ECOG PS- 1  Pain scale- 4   Review of systems- Review of Systems  Constitutional:  Positive for fever.  Musculoskeletal:  Positive for joint pain (Right hip pain).   No Known Allergies  Patient Active Problem List   Diagnosis Date Noted   Right hip pain 07/06/2021   Mixed hyperlipidemia 10/02/2017   Goiter, nodular 10/21/2015   Erectile dysfunction 03/11/2013   Essential hypertension 03/11/2013   Cataract 03/09/2011     Past Medical History:  Diagnosis Date   History of colon polyps    Hypercholesteremia    Per [facility] New Patient Packet.   Hyperlipidemia    Hypertension    Per [facility] New Patient Packet.   Post-nasal drip    Per [place] Patient Packet.   Squamous cell carcinoma of skin 12/21/2020   Right mid helix, [facility]   Squamous cell carcinoma of skin 12/21/2020   in situ, Central lower lip, needs appt for LN2 followed by 5FU   Squamous cell carcinoma of skin 02/10/2021   right dorsum hand, [facility]      Past Surgical History:  Procedure Laterality Date   COLONOSCOPY  11/28/2016   [facility], Per [facility] New Patient Packet.   EAR BIOPSY     and lower lip   HERNIA REPAIR     umbilical and inguinal (unsure what side) hernia   NOSE SURGERY     [place], Per [place]  Patient Packet.   TONSILLECTOMY     Per [facility] New Patient Packet.    Social History   Socioeconomic History   Marital status: Married    Spouse name: Marsalis Beaulieu   Number of children: 3   Years of education: Not on file   Highest education level: Not on file  Occupational History   Not on file  Tobacco Use   Smoking status: Never   Smokeless tobacco: Never  Vaping Use   Vaping Use: Never used  Substance and Sexual Activity   Alcohol use: Yes    Alcohol/week: 1.0 standard drink    Types: 1 Cans of beer per week    Comment: 1 Beer Every 6 months.    Drug use: No   Sexual activity:  Yes  Other Topics Concern   Not on file  Social History Narrative   Tobacco use, amount per day now: None   Past tobacco use, amount per day: None   How many years did you use tobacco: None   Alcohol use (drinks per week): 1 Beer every 6 months.   Diet:   Do you drink/eat things with caffeine: Diet Pepsi   Marital status: Married                                 What year were you married? 1964   Do you live in a house, apartment, assisted living, condo, trailer, etc.? Villa in [facility].   Is it one or more stories? 1   How many persons live in your home? 2   Do you have pets in your home?( please list) No   Highest Level of education completed: College   Current or past profession:    Do you exercise? Yes                                   Type and how often? Walk Daily.   Do you have a living will? Yes in 2020   Do you have a DNR form? No                                  If not, do you want to discuss one?   Do you have signed POA/HPOA forms? Yes in 2020                        If so, please bring to you appointment    Do you have any difficulty bathing or dressing yourself? No    Do you have difficulty preparing food or eating? No   Do you have difficulty managing your medications? No   Do you have any difficulty managing your finances? No   Do you have any difficulty affording your medications? No      Social Determinants of Radio broadcast assistant Strain: Not on file  Food Insecurity: Not on file  Transportation Needs: Not on file  Physical Activity: Not on file  Stress: Not on file  Social Connections: Not on file  Intimate Partner Violence: Not on file     Family History  Problem Relation Age of Onset   Hypertension Mother    Dementia Father    Diabetes Daughter      Current Facility-Administered  Medications:    acetaminophen (TYLENOL) tablet 650 mg, 650 mg, Oral, Q6H PRN, Irene Pap N, DO   erythromycin ophthalmic ointment 1 application, 1  application, Both Eyes, TID, Hall, Carole N, DO   ferrous sulfate tablet 325 mg, 325 mg, Oral, Daily, Hall, Carole N, DO   fluticasone (FLONASE) 50 MCG/ACT nasal spray 1 spray, 1 spray, Each Nare, Daily PRN, Hall, Carole N, DO   HYDROmorphone (DILAUDID) injection 0.5 mg, 0.5 mg, Intravenous, Q4H PRN, Hall, Carole N, DO   lactated ringers infusion, , Intravenous, Continuous, Hall, Carole N, DO   ondansetron (ZOFRAN) injection 4 mg, 4 mg, Intravenous, Q6H PRN, [place] Crane, Carole N, DO   oxyCODONE (Oxy IR/ROXICODONE) immediate release tablet 5 mg, 5 mg, Oral, Q4H PRN, Hall, Carole N, DO   pantoprazole (PROTONIX) EC tablet 40 mg, 40 mg, Oral, Daily PRN, Hall, Carole N, DO   polyethylene glycol (MIRALAX / GLYCOLAX) packet 17 g, 17 g, Oral, Daily PRN, Hall, Carole N, DO   simvastatin (ZOCOR) tablet 20 mg, 20 mg, Oral, q1800, Kayleen Memos, DO  Current Outpatient Medications:    acetaminophen (TYLENOL) 500 MG tablet, Take 500-1,000 mg by mouth every 6 (six) hours as needed for mild pain., Disp: , Rfl:    enalapril (VASOTEC) 20 MG tablet, TAKE 1 TABLET(20 MG) BY MOUTH EVERY MORNING, Disp: 90 tablet, Rfl: 3   erythromycin ophthalmic ointment, Place 1 application into both eyes 3 (three) times daily. Instill ~1 cm ribbon into both eyes three times daily for 1 week, Disp: 3.5 g, Rfl: 0   fluticasone (FLONASE) 50 MCG/ACT nasal spray, Place 1 spray into both nostrils daily as needed for allergies or rhinitis., Disp: 11.1 mL, Rfl: 3   Iron, Ferrous Sulfate, 325 (65 Fe) MG TABS, Take 325 mg by mouth daily., Disp: 30 tablet, Rfl: 55   pantoprazole (PROTONIX) 40 MG tablet, TAKE 1 TABLET(40 MG) BY MOUTH DAILY (Patient taking differently: Take 40 mg by mouth daily as needed (acid reflux symptoms).), Disp: 90 tablet, Rfl: 1   sildenafil (VIAGRA) 100 MG tablet, Take 1 tablet (100 mg total) by mouth daily as needed for erectile dysfunction., Disp: 10 tablet, Rfl: 1   simvastatin (ZOCOR) 20 MG tablet, TAKE 1 TABLET(20 MG) BY  MOUTH AT BEDTIME, Disp: 90 tablet, Rfl: 1   Undecylenic Ac-Zn Undecylenate (FUNGI-NAIL TOE & FOOT EX), Apply 1 application topically as needed (toenail fungus)., Disp: , Rfl:    XARELTO 20 MG TABS tablet, TAKE 1 TABLET(20 MG) BY MOUTH AT BEDTIME, Disp: 90 tablet, Rfl: 1   fluorouracil (EFUDEX) 5 % cream, Apply to lower lip twice a day x 1-2 weeks as directed. (Patient not taking: No sig reported), Disp: 40 g, Rfl: 0   hydrochlorothiazide (HYDRODIURIL) 25 MG tablet, Take 1 tablet (25 mg total) by mouth daily. (Patient not taking: No sig reported), Disp: 90 tablet, Rfl: 3   Physical exam:  Vitals:   07/06/21 1343 07/06/21 1400 07/06/21 1430 07/06/21 1530  BP:  (!) 102/58 (!) 99/52 (!) 105/53  Pulse: 80 81  74  Resp:      Temp:      TempSrc:      SpO2: 93% 92%  94%   Physical Exam Cardiovascular:     Rate and Rhythm: Normal rate and regular rhythm.     Heart sounds: Normal heart sounds.  Pulmonary:     Effort: Pulmonary effort is normal.     Breath sounds: Normal breath sounds.  Abdominal:  General: Bowel sounds are normal.     Palpations: Abdomen is soft.     Comments: No palpable splenomegaly  Musculoskeletal:     Cervical back: Normal range of motion.  Lymphadenopathy:     Comments: No palpable cervical, supraclavicular, axillary or inguinal adenopathy    Skin:    General: Skin is warm and dry.  Neurological:     Mental Status: He is alert and oriented to person, place, and time.       CMP Latest Ref Rng & Units 07/06/2021  Glucose 70 - 99 mg/dL 120(H)  BUN 8 - 23 mg/dL 28(H)  Creatinine 0.61 - 1.24 mg/dL 1.42(H)  Sodium 135 - 145 mmol/L 137  Potassium 3.5 - 5.1 mmol/L 3.7  Chloride 98 - 111 mmol/L 103  CO2 22 - 32 mmol/L 26  Calcium 8.9 - 10.3 mg/dL 8.4(L)  Total Protein 6.5 - 8.1 g/dL 8.8(H)  Total Bilirubin 0.3 - 1.2 mg/dL 1.0  Alkaline Phos 38 - 126 U/L 52  AST 15 - 41 U/L 16  ALT 0 - 44 U/L 12   CBC Latest Ref Rng & Units 07/06/2021  WBC 4.0 - 10.5 K/uL  17.1(H)  Hemoglobin 13.0 - 17.0 g/dL 10.4(L)  Hematocrit 39.0 - 52.0 % 32.1(L)  Platelets 150 - 400 K/uL 148(L)    _0 @  DG Lumbar Spine Complete  Result Date: 07/06/2021 CLINICAL DATA:  Right groin pain. EXAM: LUMBAR SPINE - COMPLETE 4+ VIEW COMPARISON:  None. FINDINGS: Mild grade 1 retrolisthesis of L2-3 is noted secondary to severe degenerative disc disease at this level. No fracture is noted. Moderate degenerative disc disease is also noted at L4-5 and L5-S1. Degenerative changes are seen involving posterior facet joints of L5-S1. IMPRESSION: Multilevel degenerative disc disease. No acute abnormality is noted. Aortic Atherosclerosis (ICD10-I70.0). Electronically Signed   By: Marijo Conception M.D.   On: 07/06/2021 08:48   MR Lumbar Spine W Wo Contrast  Result Date: 07/06/2021 CLINICAL DATA:  Lumbar radiculopathy, cancer or infection suspected EXAM: MRI LUMBAR SPINE WITHOUT AND WITH CONTRAST TECHNIQUE: Multiplanar and multiecho pulse sequences of the lumbar spine were obtained without and with intravenous contrast. CONTRAST:  28m GADAVIST GADOBUTROL 1 MMOL/ML IV SOLN COMPARISON:  Lumbar radiographs from the same day. FINDINGS: Segmentation: Standard segmentation is assumed with the inferior-most fully formed intervertebral disc labeled L5-S1. Alignment: Grade 1 retrolisthesis of L2 on L3 and grade 1 anterolisthesis of L5 on S1. Slight retrolisthesis of L3 on L4 and L4 on L5. Dextrocurvature of the upper lumbar spine with levocurvature of the lower lumbar spine. Vertebrae: Degenerative/discogenic endplate signal changes about the left L2-L3 and L5-S1 and right L4-L5 disc spaces, where there is disc space narrowing. Otherwise, no focal marrow edema to suggest acute fracture or discitis/osteomyelitis. Mild disc edema and enhancement at the sites of degenerative disc space narrowing described above. Mildly heterogeneous marrow without suspicious bone lesion. Conus medullaris and cauda equina: Conus  extends to the L1-L2 level. Conus appears normal. No abnormal enhancement of the conus or cauda equina. Fatty filum terminalis. Paraspinal and other soft tissues: Bilateral renal cysts. No appreciable paraspinal edema. Disc levels: T11-T12: Only imaged sagittally without evidence of significant canal or foraminal stenosis. T12-L1: No significant disc protrusion, foraminal stenosis, or canal stenosis. L1-L2: Mild disc bulging and facet arthropathy without significant canal or foraminal stenosis. L2-L3: Left eccentric degenerative disc disease. Grade 1 retrolisthesis of L2 on L3. Disc bulging, including biforaminal disc protrusions. Mild to moderate right greater than left foraminal stenosis.  Mild bilateral subarticular recess narrowing without significant canal stenosis. L3-L4: Slight retrolisthesis of L3 on L4. Disc bulging, including biforaminal disc protrusions. Moderate bilateral facet hypertrophy. Resulting mild to moderate right greater than left foraminal stenosis. Mild canal stenosis and right greater than left subarticular recess stenosis L4-L5: Right eccentric degenerative disc disease. Slight retrolisthesis of L4 on L5. Disc bulging, including biforaminal disc protrusions, and ligamentum flavum thickening. Resulting moderate bilateral foraminal stenosis. Mild canal stenosis with right greater than left subarticular recess stenosis. L5-S1: Grade 1 anterolisthesis of L5 on S1. Broad disc bulge with moderate bilateral facet hypertrophy. Resulting moderate left and mild right foraminal stenosis. IMPRESSION: 1. Moderate foraminal stenosis bilaterally at L4-L5 and on the left at L5-S1. Mild-to-moderate right greater than left foraminal stenosis at L2-L3 and L3-L4. 2. Mild canal stenosis at L3-L4 and L4-L5 with right greater than left subarticular recess narrowing. 3. Degenerative disc disease greatest at L2-L3, L4-L5 and L5-S1, detailed above. Electronically Signed   By: Margaretha Sheffield MD   On: 07/06/2021  12:23   MR HIP RIGHT W WO CONTRAST  Result Date: 07/06/2021 CLINICAL DATA:  Right groin pain. EXAM: MRI OF THE RIGHT HIP WITHOUT AND WITH CONTRAST TECHNIQUE: Multiplanar, multisequence MR imaging was performed both before and after administration of intravenous contrast. CONTRAST:  82m GADAVIST GADOBUTROL 1 MMOL/ML IV SOLN COMPARISON:  Right hip x-rays from same day. FINDINGS: Bones: There is no evidence of acute fracture, dislocation or avascular necrosis. No focal bone lesion. The visualized sacroiliac joints and symphysis pubis appear normal. Articular cartilage and labrum Articular cartilage: No focal chondral defect or subchondral signal abnormality identified. Labrum: Grossly intact, although evaluation is limited due to lack of intra-articular fluid. No paralabral abnormality. Joint or bursal effusion Joint effusion: Trace right hip joint effusion. Bursae: No focal periarticular fluid collection. Muscles and tendons Muscles and tendons: Partial tear of the right gluteus medius tendon (series 8, image 12). The bilateral hamstring and iliopsoas tendons appear normal. Mild edema and enhancement in the proximal vastus lateralis muscle. More prominent myofascial edema and enhancement around the lesser trochanter. Other findings Miscellaneous: Severe colonic diverticulosis. Bilateral renal cysts. IMPRESSION: 1. Myofascial edema/inflammation around the right intertrochanteric femur is nonspecific, but concerning for infection given leukocytosis, especially if there is no history of trauma. No drainable fluid collection. 2. Trace right hip joint effusion without convincing evidence of septic arthritis. No osteomyelitis. 3. Partial tear of the right gluteus medius tendon. Electronically Signed   By: WTitus DubinM.D.   On: 07/06/2021 12:39   UKoreaVenous Img Lower Unilateral Right  Result Date: 07/06/2021 CLINICAL DATA:  Right lower extremity pain 3-4 weeks, history of positive DVT in 2016 EXAM: RIGHT LOWER  EXTREMITY VENOUS DOPPLER ULTRASOUND TECHNIQUE: Gray-scale sonography with graded compression, as well as color Doppler and duplex ultrasound were performed to evaluate the lower extremity deep venous systems from the level of the common femoral vein and including the common femoral, femoral, profunda femoral, popliteal and calf veins including the posterior tibial, peroneal and gastrocnemius veins when visible. The superficial great saphenous vein was also interrogated. Spectral Doppler was utilized to evaluate flow at rest and with distal augmentation maneuvers in the common femoral, femoral and popliteal veins. COMPARISON:  March 2016 FINDINGS: Contralateral Common Femoral Vein: Respiratory phasicity is normal and symmetric with the symptomatic side. No evidence of thrombus. Normal compressibility. Common Femoral Vein: No evidence of thrombus. Normal compressibility, respiratory phasicity and response to augmentation. Saphenofemoral Junction: No evidence of thrombus. Normal compressibility and  flow on color Doppler imaging. Profunda Femoral Vein: No evidence of thrombus. Normal compressibility and flow on color Doppler imaging. Femoral Vein: No evidence of thrombus. Normal compressibility, respiratory phasicity and response to augmentation. Popliteal Vein: No evidence of thrombus. Normal compressibility, respiratory phasicity and response to augmentation. Calf Veins: No evidence of thrombus. Normal compressibility and flow on color Doppler imaging. Other Findings:  None. IMPRESSION: No findings of acute or chronic right lower extremity deep venous thrombosis. Electronically Signed   By: Albin Felling MD   On: 07/06/2021 08:46   DG Chest Port 1 View  Result Date: 07/06/2021 CLINICAL DATA:  Right groin pain. EXAM: PORTABLE CHEST 1 VIEW COMPARISON:  CT chest and chest x-ray dated February 19, 2012. FINDINGS: The heart size and mediastinal contours are within normal limits. Low lung volumes are present, causing  crowding of the pulmonary vasculature. Mild bibasilar atelectasis. No focal consolidation, pleural effusion, or pneumothorax. No acute osseous abnormality. IMPRESSION: 1. No active disease. Electronically Signed   By: Titus Dubin M.D.   On: 07/06/2021 14:54   DG Hip Unilat W or Wo Pelvis 2-3 Views Right  Result Date: 07/06/2021 CLINICAL DATA:  Right hip and groin pain EXAM: DG HIP (WITH OR WITHOUT PELVIS) 2-3V RIGHT COMPARISON:  None. FINDINGS: There is no evidence of hip fracture or dislocation. Mild hip joint space narrowing. Partially visualized lower lumbar spondylosis. IMPRESSION: Mild degenerative changes of the right hip.  No acute findings. Electronically Signed   By: Davina Poke D.O.   On: 07/06/2021 08:46    Assessment and plan- Patient is a 85 y.o. male admitted to the hospital for fever and right hip pain found to have leukocytosis/monocytosis with peripheral blood smear concerning for myeloid neoplasm  CBC shows leukocytosis with marked monocytosis.  However a week ago patient's white cell count was normal.  Findings of blood smear are concerning for myeloid neoplasm.  Patient does have some evidence of moderate macrocytic anemia as well.  Today I will do a complete anemia work-up including Reticulocyte, TSH, ferritin and iron studies B12 and folate.   I have also ordered peripheral flow cytometry, BCR ABL FISH panel and intelligent myeloid panel.  Uric acid is normal.  Fibrinogen levels are high and not low 1 therefore not suggestive of DIC.  LDH and total bilirubin are normal and therefore not suggestive of any hemolysis.  From a hematology standpoint patient does not have to remain hospitalized.  Looking back at his CBC is his absolute monocyte count is presently elevated at 9400 and he was also found to have elevated monocyte count of 7328 in July 2022.  CMML is a consideration and remains high on the differential.  I will keep the patient n.p.o. after midnight for possible bone  marrow biopsy in the morning.  I will reach out to interventional radiology in the morning if it would be okay to perform a bone marrow biopsy while he is on Xarelto.  No need to hold Xarelto overnight.  If radiology requires anticoagulation to be withheld and patient will remain in the hospital over the next 2 to 3 days we can hold Xarelto for 2 days and then perform a bone marrow biopsy on Friday.  I explained all this to the patient and his daughter in detail  Patient's hip pain seems to be unrelated to his underlying hematologic issue.  Is also currently unclear if his low-grade fever is related to his underlying hematologic issue or due to some other etiology.  As such infectious etiology and will need to be ruled out for his fever  Visit Diagnosis Monocytosis Abnormal blood smear  Dr. Randa Evens, MD, MPH [facility] [phone] 07/06/2021

## 2021-07-06 NOTE — ED Notes (Signed)
Pt reported pulsations and spasms in rt groin area.  MD notified.

## 2021-07-06 NOTE — Consult Note (Signed)
PHARMACY -  BRIEF ANTIBIOTIC NOTE   Pharmacy has received consult(s) for cefepime and vancomycin from an ED provider.  The patient's profile has been reviewed for ht/wt/allergies/indication/available labs.    One time order(s) placed for --Cefepime 2 g IV --Vancomycin 1 g IV  Further antibiotics/pharmacy consults should be ordered by admitting physician if indicated.                       Thank you, Benita Gutter 07/06/2021  2:12 PM

## 2021-07-06 NOTE — ED Notes (Signed)
Pt switched over to hospital bed at this time.

## 2021-07-06 NOTE — ED Provider Notes (Signed)
[facility] Emergency Department Provider Note  ____________________________________________   Event Date/Time   First MD Initiated Contact with Patient 07/06/21 0710     (approximate)  I have reviewed the triage vital signs and the nursing notes.   HISTORY  Chief Complaint Leg Pain (Groin Pain )    HPI Vincent Harper is a 85 y.o. 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  The patient states that for the last week or so, he has had progressively worsening aching, gnawing, right hip pain.  The pain was initially primarily with weightbearing but now is present at rest.  He has pain with passive range of motion.  The pain is aching, gnawing, and severe, 10 out of 10, with movement.  He does note that he has had some subjective fevers as well off and on for the last several weeks, though he thought this was related to a left eye infection for which she has been on antibiotics.  Denies any preceding trauma.  No distal numbness or weakness.  No leg swelling.  He has been taking his anticoagulants as prescribed.    Past Medical History:  Diagnosis Date   History of colon polyps    Hypercholesteremia    Per [place] New Patient Packet.   Hyperlipidemia    Hypertension    Per [facility] New Patient Packet.   Post-nasal drip    Per [place] Patient Packet.   Squamous cell carcinoma of skin 12/21/2020   Right mid helix, [facility]   Squamous cell carcinoma of skin 12/21/2020   in situ, Central lower lip, needs appt for LN2 followed by 5FU   Squamous cell carcinoma of skin 02/10/2021   right dorsum hand, [facility]     Patient Active Problem List   Diagnosis Date Noted   Right hip pain 07/06/2021   Mixed hyperlipidemia 10/02/2017   Goiter, nodular 10/21/2015   Erectile dysfunction 03/11/2013   Essential hypertension 03/11/2013   Cataract 03/09/2011    Past Surgical History:  Procedure  Laterality Date   COLONOSCOPY  11/28/2016   [facility], Per [facility] New Patient Packet.   EAR BIOPSY     and lower lip   HERNIA REPAIR     umbilical and inguinal (unsure what side) hernia   NOSE SURGERY     [place], Per [facility] New Patient Packet.   TONSILLECTOMY     Per [facility] New Patient Packet.    Prior to Admission medications   Medication Sig Start Date End Date Taking? Authorizing Provider  acetaminophen (TYLENOL) 500 MG tablet Take 500-1,000 mg by mouth every 6 (six) hours as needed for mild pain.   Yes [provider]  enalapril (VASOTEC) 20 MG tablet TAKE 1 TABLET(20 MG) BY MOUTH EVERY MORNING 02/12/21  Yes [name], NP  erythromycin ophthalmic ointment Place 1 application into both eyes 3 (three) times daily. Instill ~1 cm ribbon into both eyes three times daily for 1 week 07/01/21  Yes Eubanks, Carlos American, NP  fluticasone (FLONASE) 50 MCG/ACT nasal spray Place 1 spray into both nostrils daily as needed for allergies or rhinitis. 12/26/19  Yes [name], NP  Iron, Ferrous Sulfate, 325 (65 Fe) MG TABS Take 325 mg by mouth daily. 06/30/21  Yes [name], NP  pantoprazole (PROTONIX) 40 MG tablet TAKE 1 TABLET(40 MG) BY MOUTH DAILY Patient taking differently: Take 40 mg by mouth  daily as needed (acid reflux symptoms). 06/30/21  Yes [name], NP  sildenafil (VIAGRA) 100 MG tablet Take 1 tablet (100 mg total) by mouth daily as needed for erectile dysfunction. 03/12/20  Yes [name], NP  simvastatin (ZOCOR) 20 MG tablet TAKE 1 TABLET(20 MG) BY MOUTH AT BEDTIME 05/10/21  Yes [name], NP  Undecylenic Ac-Zn Undecylenate (FUNGI-NAIL TOE & FOOT EX) Apply 1 application topically as needed (toenail fungus).   Yes [provider]  XARELTO 20 MG TABS tablet TAKE 1 TABLET(20 MG) BY MOUTH AT BEDTIME 02/23/21  Yes [name], NP  fluorouracil (EFUDEX) 5 % cream Apply to lower lip twice a day x 1-2 weeks as directed. Patient not taking: No  sig reported 02/10/21   [name], MD  hydrochlorothiazide (HYDRODIURIL) 25 MG tablet Take 1 tablet (25 mg total) by mouth daily. Patient not taking: No sig reported 10/13/20   [name], NP    Allergies Patient has no known allergies.  Family History  Problem Relation Age of Onset   Hypertension Mother    Dementia Father    Diabetes Daughter     Social History Social History   Tobacco Use   Smoking status: Never   Smokeless tobacco: Never  Vaping Use   Vaping Use: Never used  Substance Use Topics   Alcohol use: Yes    Alcohol/week: 1.0 standard drink    Types: 1 Cans of beer per week    Comment: 1 Beer Every 6 months.    Drug use: No    Review of Systems  Review of Systems  Constitutional:  Positive for fatigue. Negative for chills and fever.  HENT:  Negative for sore throat.   Respiratory:  Negative for shortness of breath.   Cardiovascular:  Positive for leg swelling. Negative for chest pain.  Gastrointestinal:  Negative for abdominal pain.  Genitourinary:  Negative for flank pain.  Musculoskeletal:  Positive for gait problem (Secondary to pain). Negative for neck pain.  Skin:  Negative for rash and wound.  Allergic/Immunologic: Negative for immunocompromised state.  Neurological:  Negative for weakness and numbness.  Hematological:  Does not bruise/bleed easily.  All other systems reviewed and are negative.   ____________________________________________  PHYSICAL EXAM:      VITAL SIGNS: ED Triage Vitals  Enc Vitals Group     BP 07/06/21 0633 120/74     Pulse Rate 07/06/21 0633 89     Resp 07/06/21 0633 18     Temp 07/06/21 0633 97.8 F (36.6 C)     Temp Source 07/06/21 0633 Oral     SpO2 --      Weight --      Height --      Head Circumference --      Peak Flow --      Pain Score 07/06/21 0623 5     Pain Loc --      Pain Edu? --      Excl. in [place]? --      Physical Exam Vitals and nursing note reviewed.  Constitutional:       General: He is not in acute distress.    Appearance: He is well-developed.  HENT:     Head: Normocephalic and atraumatic.  Eyes:     Conjunctiva/sclera: Conjunctivae normal.  Cardiovascular:     Rate and Rhythm: Normal rate and regular rhythm.     Heart sounds: Normal heart sounds. No murmur heard.   No friction  rub.  Pulmonary:     Effort: Pulmonary effort is normal. No respiratory distress.     Breath sounds: Normal breath sounds. No wheezing or rales.  Abdominal:     General: There is no distension.     Palpations: Abdomen is soft.     Tenderness: There is no abdominal tenderness.  Musculoskeletal:     Cervical back: Neck supple.     Right lower leg: Edema (1+ pitting) present.     Left lower leg: No edema.     Comments: Significant tenderness with any passive range of motion of the right hip.  Tenderness with logroll.  No bruising or deformity.  No warmth appreciated.  Skin:    General: Skin is warm.     Capillary Refill: Capillary refill takes less than 2 seconds.  Neurological:     Mental Status: He is alert and oriented to person, place, and time.     Motor: No abnormal muscle tone.      ____________________________________________   LABS (all labs ordered are listed, but only abnormal results are displayed)  Labs Reviewed  CBC WITH DIFFERENTIAL/PLATELET - Abnormal; Notable for the following components:      Result Value   WBC 17.1 (*)    RBC 3.16 (*)    Hemoglobin 10.4 (*)    HCT 32.1 (*)    MCV 101.6 (*)    Platelets 148 (*)    Monocytes Absolute 9.4 (*)    Eosinophils Absolute 0.7 (*)    All other components within normal limits  PROTIME-INR - Abnormal; Notable for the following components:   Prothrombin Time 26.3 (*)    INR 2.4 (*)    All other components within normal limits  COMPREHENSIVE METABOLIC PANEL - Abnormal; Notable for the following components:   Glucose, Bld 120 (*)    BUN 28 (*)    Creatinine, Ser 1.42 (*)    Calcium 8.4 (*)    Total  Protein 8.8 (*)    Albumin 2.7 (*)    GFR, Estimated 49 (*)    All other components within normal limits  SEDIMENTATION RATE - Abnormal; Notable for the following components:   Sed Rate 106 (*)    All other components within normal limits  C-REACTIVE PROTEIN - Abnormal; Notable for the following components:   CRP 10.0 (*)    All other components within normal limits  URINALYSIS, COMPLETE (UACMP) WITH MICROSCOPIC - Abnormal; Notable for the following components:   Color, Urine YELLOW (*)    APPearance CLEAR (*)    Hgb urine dipstick LARGE (*)    Protein, ur 30 (*)    All other components within normal limits  FIBRINOGEN - Abnormal; Notable for the following components:   Fibrinogen 646 (*)    All other components within normal limits  FERRITIN - Abnormal; Notable for the following components:   Ferritin 424 (*)    All other components within normal limits  IRON AND TIBC - Abnormal; Notable for the following components:   TIBC 148 (*)    All other components within normal limits  RETICULOCYTES - Abnormal; Notable for the following components:   RBC. 3.10 (*)    Immature Retic Fract 27.2 (*)    All other components within normal limits  TSH - Abnormal; Notable for the following components:   TSH 4.744 (*)    All other components within normal limits  CULTURE, BLOOD (ROUTINE X 2)  CULTURE, BLOOD (ROUTINE X 2)  SARS CORONAVIRUS 2 (  TAT 6-24 HRS)  BRAIN NATRIURETIC PEPTIDE  LACTIC ACID, PLASMA  LACTIC ACID, PLASMA  PATHOLOGIST SMEAR REVIEW  LACTATE DEHYDROGENASE  URIC ACID  PHOSPHORUS  FOLATE  COMP PANEL: LEUKEMIA/LYMPHOMA  BCR-ABL1 FISH  INTELLIGEN MYELOID  VITAMIN B12  CBC WITH DIFFERENTIAL/PLATELET  COMPREHENSIVE METABOLIC PANEL  MAGNESIUM  PROCALCITONIN    ____________________________________________  EKG:  ________________________________________  RADIOLOGY All imaging, including plain films, CT scans, and ultrasounds, independently reviewed by me, and  interpretations confirmed via formal radiology reads.  ED MD interpretation:   Ultrasound:Neg for DVT X-ray right hip:No Fx  X-ray spinal lumbar:No fx MR Hip: myofascial edema/inflammation, ? Infection, versus trauma, partial tear of R gluteus medius muscle MR Spine: Foraminal stenosis and DDD  Official radiology report(s): DG Lumbar Spine Complete  Result Date: 07/06/2021 CLINICAL DATA:  Right groin pain. EXAM: LUMBAR SPINE - COMPLETE 4+ VIEW COMPARISON:  None. FINDINGS: Mild grade 1 retrolisthesis of L2-3 is noted secondary to severe degenerative disc disease at this level. No fracture is noted. Moderate degenerative disc disease is also noted at L4-5 and L5-S1. Degenerative changes are seen involving posterior facet joints of L5-S1. IMPRESSION: Multilevel degenerative disc disease. No acute abnormality is noted. Aortic Atherosclerosis (ICD10-I70.0). Electronically Signed   By: Marijo Conception M.D.   On: 07/06/2021 08:48   MR Lumbar Spine W Wo Contrast  Result Date: 07/06/2021 CLINICAL DATA:  Lumbar radiculopathy, cancer or infection suspected EXAM: MRI LUMBAR SPINE WITHOUT AND WITH CONTRAST TECHNIQUE: Multiplanar and multiecho pulse sequences of the lumbar spine were obtained without and with intravenous contrast. CONTRAST:  52m GADAVIST GADOBUTROL 1 MMOL/ML IV SOLN COMPARISON:  Lumbar radiographs from the same day. FINDINGS: Segmentation: Standard segmentation is assumed with the inferior-most fully formed intervertebral disc labeled L5-S1. Alignment: Grade 1 retrolisthesis of L2 on L3 and grade 1 anterolisthesis of L5 on S1. Slight retrolisthesis of L3 on L4 and L4 on L5. Dextrocurvature of the upper lumbar spine with levocurvature of the lower lumbar spine. Vertebrae: Degenerative/discogenic endplate signal changes about the left L2-L3 and L5-S1 and right L4-L5 disc spaces, where there is disc space narrowing. Otherwise, no focal marrow edema to suggest acute fracture or discitis/osteomyelitis.  Mild disc edema and enhancement at the sites of degenerative disc space narrowing described above. Mildly heterogeneous marrow without suspicious bone lesion. Conus medullaris and cauda equina: Conus extends to the L1-L2 level. Conus appears normal. No abnormal enhancement of the conus or cauda equina. Fatty filum terminalis. Paraspinal and other soft tissues: Bilateral renal cysts. No appreciable paraspinal edema. Disc levels: T11-T12: Only imaged sagittally without evidence of significant canal or foraminal stenosis. T12-L1: No significant disc protrusion, foraminal stenosis, or canal stenosis. L1-L2: Mild disc bulging and facet arthropathy without significant canal or foraminal stenosis. L2-L3: Left eccentric degenerative disc disease. Grade 1 retrolisthesis of L2 on L3. Disc bulging, including biforaminal disc protrusions. Mild to moderate right greater than left foraminal stenosis. Mild bilateral subarticular recess narrowing without significant canal stenosis. L3-L4: Slight retrolisthesis of L3 on L4. Disc bulging, including biforaminal disc protrusions. Moderate bilateral facet hypertrophy. Resulting mild to moderate right greater than left foraminal stenosis. Mild canal stenosis and right greater than left subarticular recess stenosis L4-L5: Right eccentric degenerative disc disease. Slight retrolisthesis of L4 on L5. Disc bulging, including biforaminal disc protrusions, and ligamentum flavum thickening. Resulting moderate bilateral foraminal stenosis. Mild canal stenosis with right greater than left subarticular recess stenosis. L5-S1: Grade 1 anterolisthesis of L5 on S1. Broad disc bulge with moderate bilateral facet hypertrophy.  Resulting moderate left and mild right foraminal stenosis. IMPRESSION: 1. Moderate foraminal stenosis bilaterally at L4-L5 and on the left at L5-S1. Mild-to-moderate right greater than left foraminal stenosis at L2-L3 and L3-L4. 2. Mild canal stenosis at L3-L4 and L4-L5 with right  greater than left subarticular recess narrowing. 3. Degenerative disc disease greatest at L2-L3, L4-L5 and L5-S1, detailed above. Electronically Signed   By: Margaretha Sheffield MD   On: 07/06/2021 12:23   MR HIP RIGHT W WO CONTRAST  Result Date: 07/06/2021 CLINICAL DATA:  Right groin pain. EXAM: MRI OF THE RIGHT HIP WITHOUT AND WITH CONTRAST TECHNIQUE: Multiplanar, multisequence MR imaging was performed both before and after administration of intravenous contrast. CONTRAST:  93m GADAVIST GADOBUTROL 1 MMOL/ML IV SOLN COMPARISON:  Right hip x-rays from same day. FINDINGS: Bones: There is no evidence of acute fracture, dislocation or avascular necrosis. No focal bone lesion. The visualized sacroiliac joints and symphysis pubis appear normal. Articular cartilage and labrum Articular cartilage: No focal chondral defect or subchondral signal abnormality identified. Labrum: Grossly intact, although evaluation is limited due to lack of intra-articular fluid. No paralabral abnormality. Joint or bursal effusion Joint effusion: Trace right hip joint effusion. Bursae: No focal periarticular fluid collection. Muscles and tendons Muscles and tendons: Partial tear of the right gluteus medius tendon (series 8, image 12). The bilateral hamstring and iliopsoas tendons appear normal. Mild edema and enhancement in the proximal vastus lateralis muscle. More prominent myofascial edema and enhancement around the lesser trochanter. Other findings Miscellaneous: Severe colonic diverticulosis. Bilateral renal cysts. IMPRESSION: 1. Myofascial edema/inflammation around the right intertrochanteric femur is nonspecific, but concerning for infection given leukocytosis, especially if there is no history of trauma. No drainable fluid collection. 2. Trace right hip joint effusion without convincing evidence of septic arthritis. No osteomyelitis. 3. Partial tear of the right gluteus medius tendon. Electronically Signed   By: WTitus DubinM.D.    On: 07/06/2021 12:39   UK[place]Venous Img Lower Unilateral Right  Result Date: 07/06/2021 CLINICAL DATA:  Right lower extremity pain 3-4 weeks, history of positive DVT in 2016 EXAM: RIGHT LOWER EXTREMITY VENOUS DOPPLER ULTRASOUND TECHNIQUE: Gray-scale sonography with graded compression, as well as color Doppler and duplex ultrasound were performed to evaluate the lower extremity deep venous systems from the level of the common femoral vein and including the common femoral, femoral, profunda femoral, popliteal and calf veins including the posterior tibial, peroneal and gastrocnemius veins when visible. The superficial great saphenous vein was also interrogated. Spectral Doppler was utilized to evaluate flow at rest and with distal augmentation maneuvers in the common femoral, femoral and popliteal veins. COMPARISON:  March 2016 FINDINGS: Contralateral Common Femoral Vein: Respiratory phasicity is normal and symmetric with the symptomatic side. No evidence of thrombus. Normal compressibility. Common Femoral Vein: No evidence of thrombus. Normal compressibility, respiratory phasicity and response to augmentation. Saphenofemoral Junction: No evidence of thrombus. Normal compressibility and flow on color Doppler imaging. Profunda Femoral Vein: No evidence of thrombus. Normal compressibility and flow on color Doppler imaging. Femoral Vein: No evidence of thrombus. Normal compressibility, respiratory phasicity and response to augmentation. Popliteal Vein: No evidence of thrombus. Normal compressibility, respiratory phasicity and response to augmentation. Calf Veins: No evidence of thrombus. Normal compressibility and flow on color Doppler imaging. Other Findings:  None. IMPRESSION: No findings of acute or chronic right lower extremity deep venous thrombosis. Electronically Signed   By: YAlbin FellingMD   On: 07/06/2021 08:46   DG Chest Port 1 V3 [address] Dr.  Result Date: 07/06/2021 CLINICAL DATA:  Right groin pain. EXAM: PORTABLE  CHEST 1 VIEW COMPARISON:  CT chest and chest x-ray dated February 19, 2012. FINDINGS: The heart size and mediastinal contours are within normal limits. Low lung volumes are present, causing crowding of the pulmonary vasculature. Mild bibasilar atelectasis. No focal consolidation, pleural effusion, or pneumothorax. No acute osseous abnormality. IMPRESSION: 1. No active disease. Electronically Signed   By: Titus Dubin M.D.   On: 07/06/2021 14:54   DG Hip Unilat W or Wo Pelvis 2-3 Views Right  Result Date: 07/06/2021 CLINICAL DATA:  Right hip and groin pain EXAM: DG HIP (WITH OR WITHOUT PELVIS) 2-3V RIGHT COMPARISON:  None. FINDINGS: There is no evidence of hip fracture or dislocation. Mild hip joint space narrowing. Partially visualized lower lumbar spondylosis. IMPRESSION: Mild degenerative changes of the right hip.  No acute findings. Electronically Signed   By: Davina Poke D.O.   On: 07/06/2021 08:46    ____________________________________________  PROCEDURES   Procedure(s) performed (including Critical Care):  .Critical Care  Date/Time: 07/06/2021 6:04 PM Performed by: Duffy Bruce, MD Authorized by: Duffy Bruce, MD   Critical care provider statement:    Critical care time (minutes):  35   Critical care time was exclusive of:  Separately billable procedures and treating other patients and teaching time   Critical care was necessary to treat or prevent imminent or life-threatening deterioration of the following conditions:  Cardiac failure, circulatory failure, respiratory failure and sepsis   Critical care was time spent personally by me on the following activities:  Development of treatment plan with patient or surrogate, discussions with consultants, evaluation of patient's response to treatment, examination of patient, obtaining history from patient or surrogate, ordering and performing treatments and interventions, ordering and review of laboratory studies, ordering and review  of radiographic studies, pulse oximetry, re-evaluation of patient's condition and review of old charts   I assumed direction of critical care for this patient from another provider in my specialty: no    ____________________________________________  INITIAL IMPRESSION / MDM / [place] / ED COURSE  As part of my medical decision making, I reviewed the following data within the [place] notes reviewed and incorporated, Old chart reviewed, Notes from prior ED visits, and [place] Controlled Substance Database       *Ernst Gerges was evaluated in Emergency Department on 07/06/2021 for the symptoms described in the history of present illness. He was evaluated in the context of the global COVID-19 pandemic, which necessitated consideration that the patient might be at risk for infection with the SARS-CoV-2 virus that causes COVID-19. Institutional protocols and algorithms that pertain to the evaluation of patients at risk for COVID-19 are in a state of rapid change based on information released by regulatory bodies including the CDC and federal and state organizations. These policies and algorithms were followed during the patient's care in the ED.  Some ED evaluations and interventions may be delayed as a result of limited staffing during the pandemic.*     Medical Decision Making:  85 yo M here with fever, R hip pain. Initial concern for septic arthritis given atraumatic R hip pain, though DDx includes traumatic sprain, reactive arthritis/tenosynovitis, underlying autoimmune arthritis given low-grade fevers. On arrival, pt nontoxic but febrile, with leukocytosis on CBC. Cultures sent. Initial plain films obtained, reviewed, and are unremarkable. DVT study negative. Given leukocytosis w/ fever and atraumatic hip pain, decision made to pursue MRI for evaluation of osteo,  septic arthritis. Dr. Roland Rack of Orthopedics consulted and will follow-up.  MR returned as above, concerning  for possible inflammatory or infectious edema within femur, no overt evidence of clear septic arthritis. Leukocytosis noted to have some atypical cells on CBC, so pathology review was completed which is concerning for ? Leukemia. Unclear if pt has primary leukemia causing his fevers and joint issues, versus infectious process in addition to underlying blood cell dyscrasia. Discussed with Dr. Janese Banks who will see pt. Will admit to hospitalist. Cultures, ABX started.  ____________________________________________  FINAL CLINICAL IMPRESSION(S) / ED DIAGNOSES  Final diagnoses:  Hypoxia  Monocytosis     MEDICATIONS GIVEN DURING THIS VISIT:  Medications  acetaminophen (TYLENOL) tablet 650 mg (has no administration in time range)  oxyCODONE (Oxy IR/ROXICODONE) immediate release tablet 5 mg (has no administration in time range)  HYDROmorphone (DILAUDID) injection 0.5 mg (has no administration in time range)  polyethylene glycol (MIRALAX / GLYCOLAX) packet 17 g (has no administration in time range)  ondansetron (ZOFRAN) injection 4 mg (has no administration in time range)  lactated ringers infusion (has no administration in time range)  erythromycin ophthalmic ointment 1 application (has no administration in time range)  fluticasone (FLONASE) 50 MCG/ACT nasal spray 1 spray (has no administration in time range)  ferrous sulfate tablet 325 mg (has no administration in time range)  pantoprazole (PROTONIX) EC tablet 40 mg (has no administration in time range)  simvastatin (ZOCOR) tablet 20 mg (has no administration in time range)  morphine 4 MG/ML injection 4 mg (4 mg Intravenous Given 07/06/21 0744)  ondansetron (ZOFRAN) injection 4 mg (4 mg Intravenous Given 07/06/21 0742)  oxymetazoline (AFRIN) 0.05 % nasal spray 1 spray (1 spray Each Nare Given 07/06/21 0911)  gadobutrol (GADAVIST) 1 MMOL/ML injection 10 mL (10 mLs Intravenous Contrast Given 07/06/21 1203)  morphine 4 MG/ML injection 4 mg (4 mg Intravenous  Given 07/06/21 1302)  acetaminophen (TYLENOL) tablet 1,000 mg (1,000 mg Oral Given 07/06/21 1302)  sodium chloride 0.9 % bolus 1,000 mL (0 mLs Intravenous Stopped 07/06/21 1503)  vancomycin (VANCOCIN) IVPB 1000 mg/200 mL premix (0 mg Intravenous Stopped 07/06/21 1503)  ceFEPIme (MAXIPIME) 2 g in sodium chloride 0.9 % 100 mL IVPB (0 g Intravenous Stopped 07/06/21 1532)     ED Discharge Orders     None        Note:  This document was prepared using Dragon voice recognition software and may include unintentional dictation errors.   Duffy Bruce, MD 07/06/21 [phone]

## 2021-07-06 NOTE — ED Notes (Signed)
Pt transported to MRI 

## 2021-07-06 NOTE — ED Notes (Signed)
Pt back from MRI and transferred to rm 38. Rectal temp checked and blood specimens obtained

## 2021-07-06 NOTE — Progress Notes (Signed)
Pharmacy Antibiotic Note  Ubaid Coulibaly is a 85 y.o. 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 Pharmacy has been consulted for vancomycin and cefepime dosing for sepsis.   Plan: Pt received 1 g IV vancomycin in the ED - will order additional 1 g for total loading dose of 2g followed by 750 mg IV q24 h Est AUC: 469 Vd: 0.5 L/kg Scr used: 1.42 Cefepime 2 g IV q12h Monitor renal function and adjust dose as clinically indicated   Weight: 91.4 kg (201 lb 8 oz)  Temp (24hrs), Avg:99.8 F (37.7 C), Min:97.8 F (36.6 C), Max:102.7 F (39.3 C)  Recent Labs  Lab 07/06/21 0717 07/06/21 0744 07/06/21 1226  WBC 17.1*  --   --   CREATININE 1.42*  --   --   LATICACIDVEN  --  1.0 1.2    Estimated Creatinine Clearance: 42.5 mL/min (A) (by C-G formula based on SCr of 1.42 mg/dL (H)).    No Known Allergies  Antimicrobials this admission: 8/9 cefepime >>  8/9 vancomycin >>  Dose adjustments this admission:   Microbiology results: 8/9 BCx: sent  Thank you for allowing pharmacy to be a part of this patient's care.  Forde Dandy Berton Butrick 07/06/2021 8:13 PM

## 2021-07-07 ENCOUNTER — Inpatient Hospital Stay: Payer: Medicare Other

## 2021-07-07 ENCOUNTER — Other Ambulatory Visit: Payer: Self-pay

## 2021-07-07 ENCOUNTER — Encounter: Payer: Self-pay | Admitting: Internal Medicine

## 2021-07-07 LAB — PATHOLOGIST SMEAR REVIEW

## 2021-07-07 LAB — COMPREHENSIVE METABOLIC PANEL
ALT: 10 U/L (ref 0–44)
AST: 14 U/L — ABNORMAL LOW (ref 15–41)
Albumin: 2.3 g/dL — ABNORMAL LOW (ref 3.5–5.0)
Alkaline Phosphatase: 44 U/L (ref 38–126)
Anion gap: 4 — ABNORMAL LOW (ref 5–15)
BUN: 26 mg/dL — ABNORMAL HIGH (ref 8–23)
CO2: 25 mmol/L (ref 22–32)
Calcium: 7.5 mg/dL — ABNORMAL LOW (ref 8.9–10.3)
Chloride: 104 mmol/L (ref 98–111)
Creatinine, Ser: 1.58 mg/dL — ABNORMAL HIGH (ref 0.61–1.24)
GFR, Estimated: 43 mL/min — ABNORMAL LOW (ref >60)
Glucose, Bld: 138 mg/dL — ABNORMAL HIGH (ref 70–99)
Potassium: 3.9 mmol/L (ref 3.5–5.1)
Sodium: 133 mmol/L — ABNORMAL LOW (ref 135–145)
Total Bilirubin: 0.9 mg/dL (ref 0.3–1.2)
Total Protein: 7.7 g/dL (ref 6.5–8.1)

## 2021-07-07 LAB — CBC WITH DIFFERENTIAL/PLATELET
Abs Immature Granulocytes: 0.2 10*3/uL — ABNORMAL HIGH (ref 0.00–0.07)
Basophils Absolute: 0 10*3/uL (ref 0.0–0.1)
Basophils Relative: 0 %
Eosinophils Absolute: 0.1 10*3/uL (ref 0.0–0.5)
Eosinophils Relative: 1 %
HCT: 26.9 % — ABNORMAL LOW (ref 39.0–52.0)
Hemoglobin: 8.7 g/dL — ABNORMAL LOW (ref 13.0–17.0)
Immature Granulocytes: 1 %
Lymphocytes Relative: 5 %
Lymphs Abs: 0.9 10*3/uL (ref 0.7–4.0)
MCH: 33.5 pg (ref 26.0–34.0)
MCHC: 32.3 g/dL (ref 30.0–36.0)
MCV: 103.5 fL — ABNORMAL HIGH (ref 80.0–100.0)
Monocytes Absolute: 14.8 10*3/uL — ABNORMAL HIGH (ref 0.1–1.0)
Monocytes Relative: 76 %
Neutro Abs: 3.3 10*3/uL (ref 1.7–7.7)
Neutrophils Relative %: 17 %
Platelets: 117 10*3/uL — ABNORMAL LOW (ref 150–400)
RBC: 2.6 MIL/uL — ABNORMAL LOW (ref 4.22–5.81)
RDW: 15.8 % — ABNORMAL HIGH (ref 11.5–15.5)
Smear Review: NORMAL
WBC: 19.2 10*3/uL — ABNORMAL HIGH (ref 4.0–10.5)
nRBC: 0.7 % — ABNORMAL HIGH (ref 0.0–0.2)

## 2021-07-07 LAB — MRSA NEXT GEN BY PCR, NASAL: MRSA by PCR Next Gen: NOT DETECTED

## 2021-07-07 LAB — PROCALCITONIN: Procalcitonin: 0.7 ng/mL

## 2021-07-07 LAB — SARS CORONAVIRUS 2 (TAT 6-24 HRS): SARS Coronavirus 2: NEGATIVE

## 2021-07-07 LAB — MAGNESIUM: Magnesium: 1.9 mg/dL (ref 1.7–2.4)

## 2021-07-07 IMAGING — US US EXTREM LOW VENOUS*L*
1 series · 13 of 24 positions shown · non-contrast
Comparison: None.

CLINICAL DATA: History of prior DVT, pain and swelling left lower
leg



[Series 1: us venous img lower uni left (dvt) · portal-venous · 13 of 31 slices shown]
[im 1/31]
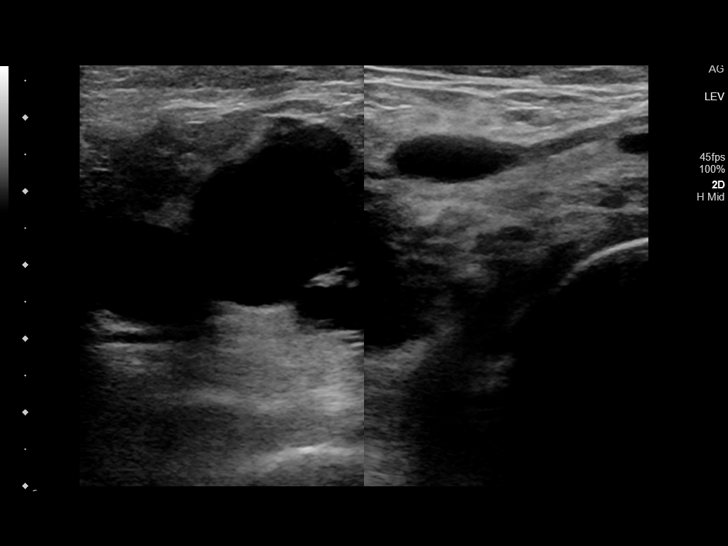
[im 3/31]
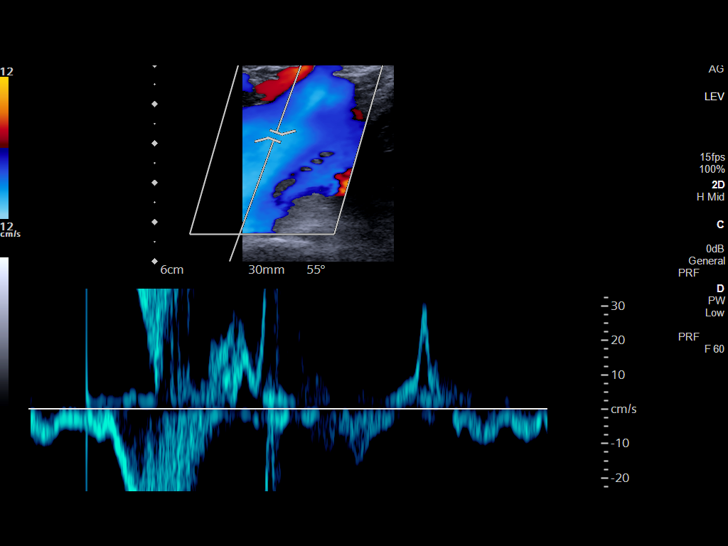
[im 6/31]
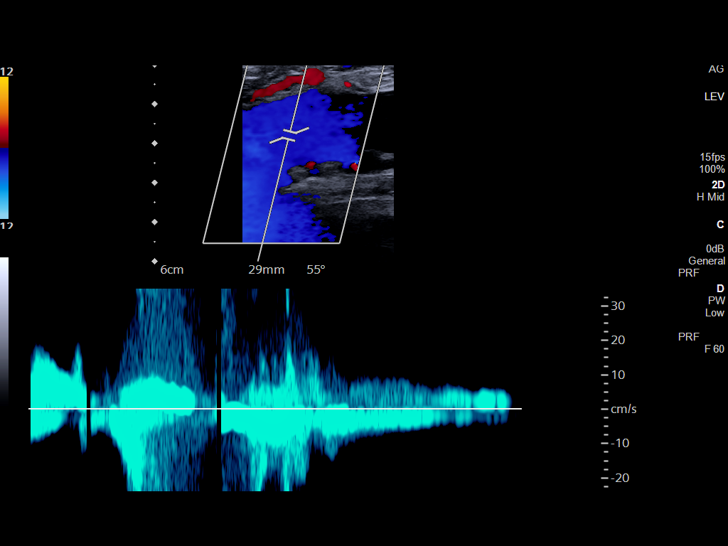
[im 8/31]
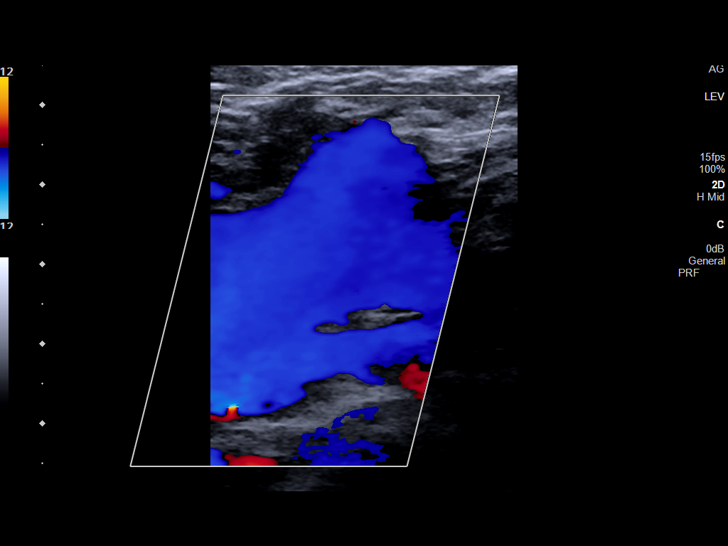
[im 11/31]
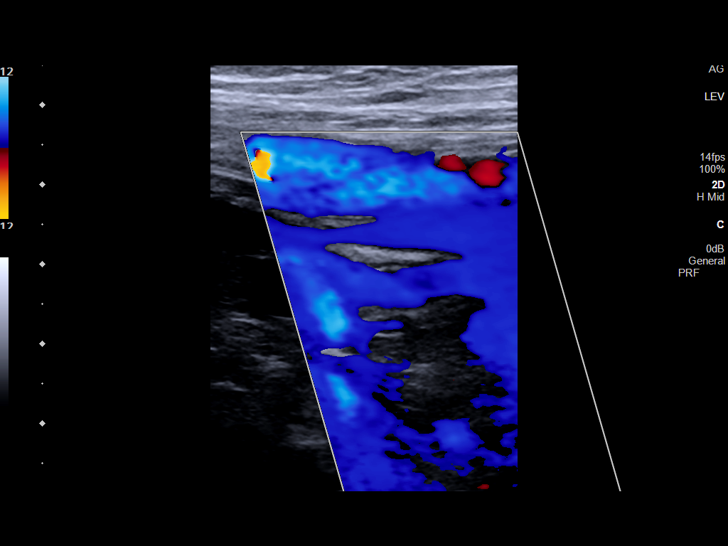
[im 14/31]
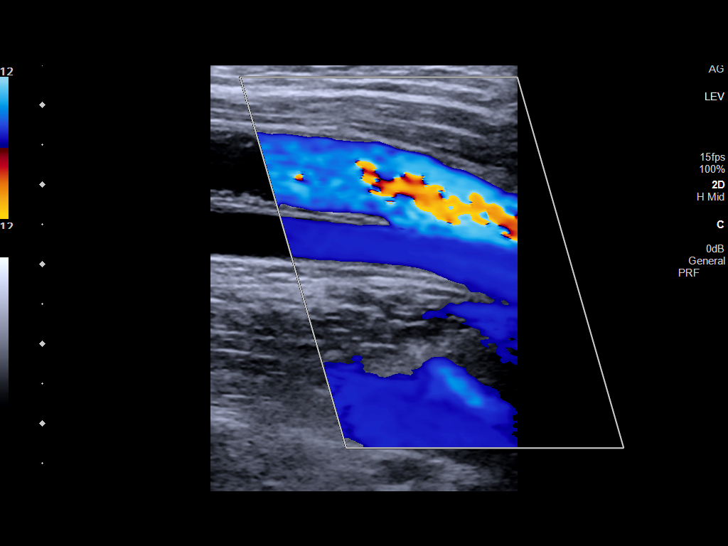
[im 16/31]
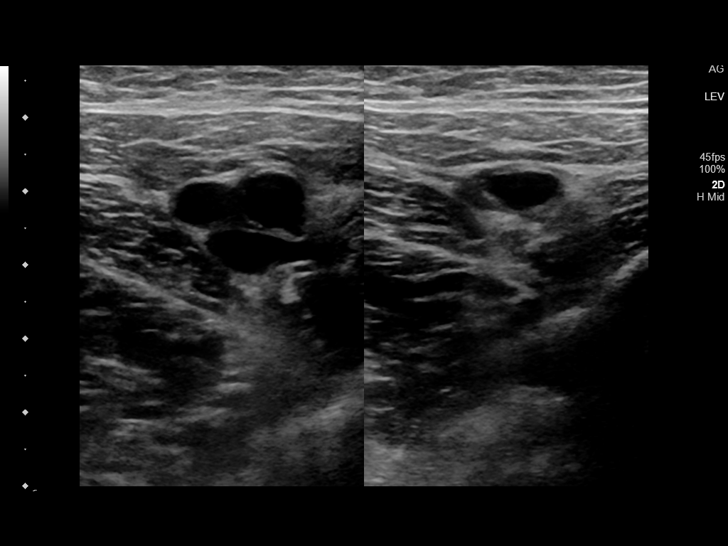
[im 17/31]
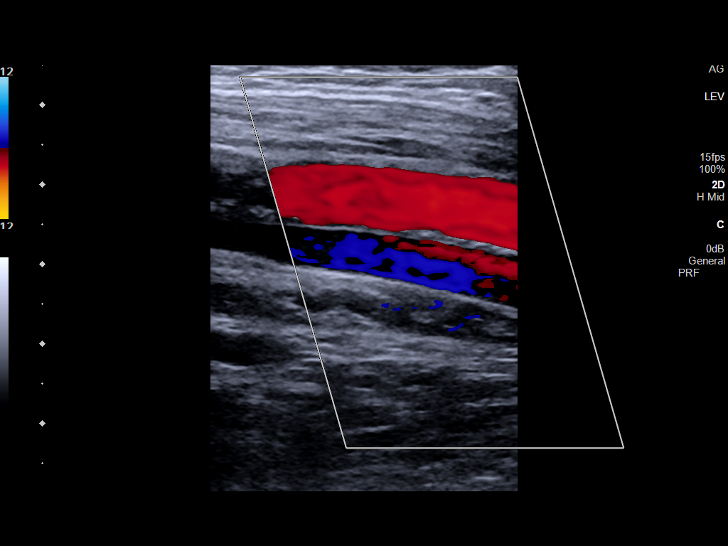
[im 20/31]
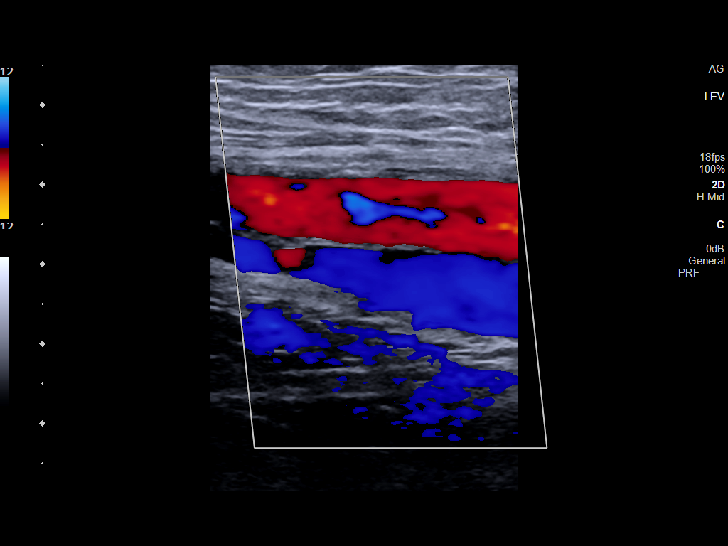
[im 23/31]
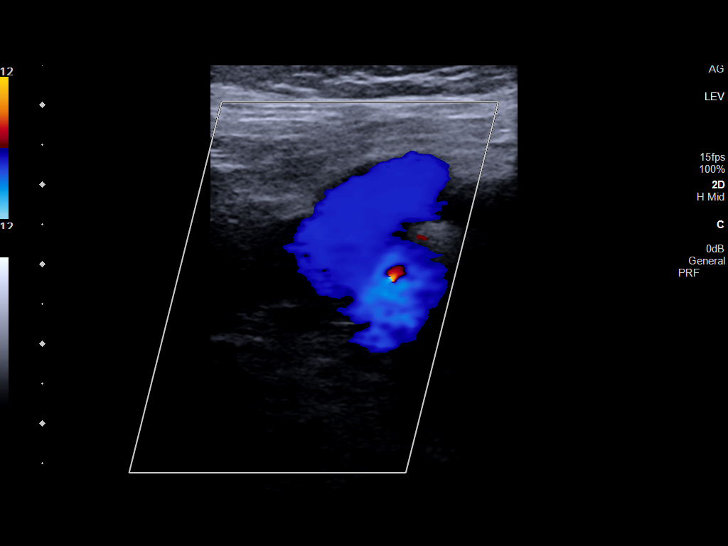
[im 25/31]
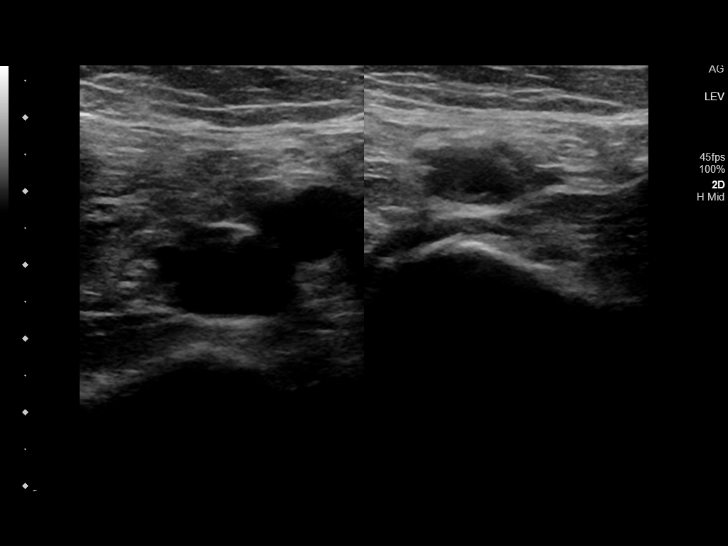
[im 28/31]
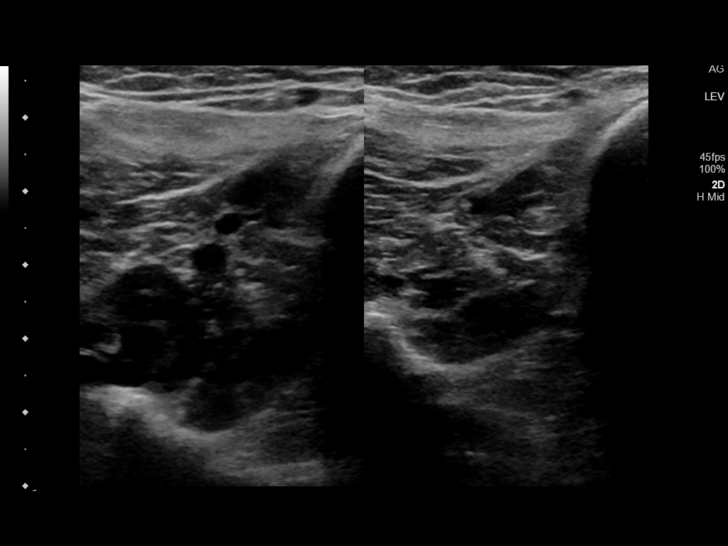
[im 31/31]
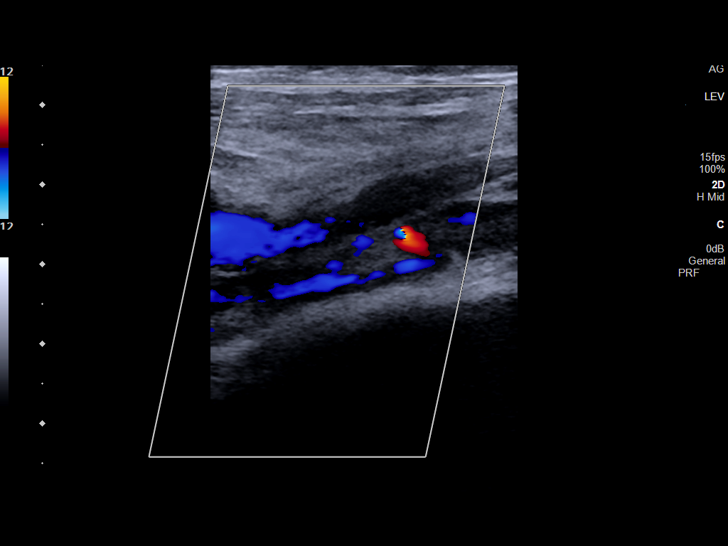

[13 of 24 positions shown; findings below may reference images not displayed]

FINDINGS: Contralateral Common Femoral Vein: Respiratory phasicity is normal
and symmetric with the symptomatic side. No evidence of thrombus.
Normal compressibility.

Common Femoral Vein: No evidence of thrombus. Normal
compressibility, respiratory phasicity and response to augmentation.

Saphenofemoral Junction: No evidence of thrombus. Normal
compressibility and flow on color Doppler imaging.

Profunda Femoral Vein: No evidence of thrombus. Normal
compressibility and flow on color Doppler imaging.

Femoral Vein: No evidence of thrombus. Normal compressibility,
respiratory phasicity and response to augmentation.

Popliteal Vein: No evidence of thrombus. Normal compressibility,
respiratory phasicity and response to augmentation.

Calf Veins: No evidence of thrombus. Normal compressibility and flow
on color Doppler imaging.
IMPRESSION: No evidence of deep venous thrombosis.

## 2021-07-07 IMAGING — CT CT BIOPSY AND ASPIRATION BONE MARROW
1 of 2 series · 11 of 14 positions shown, 14 images · non-contrast
Comparison: none

INDICATION: 85-year-old with monocytosis and peripheral blood smear is
concerning for myeloid neoplasm.

[Series 3: i-spiral 5.0 b30f · axial · 0.85mm/px · z∈[+754,+855]mm · 11 of 35 slices shown, 14 images]
[im 3/35  soft-tissue]
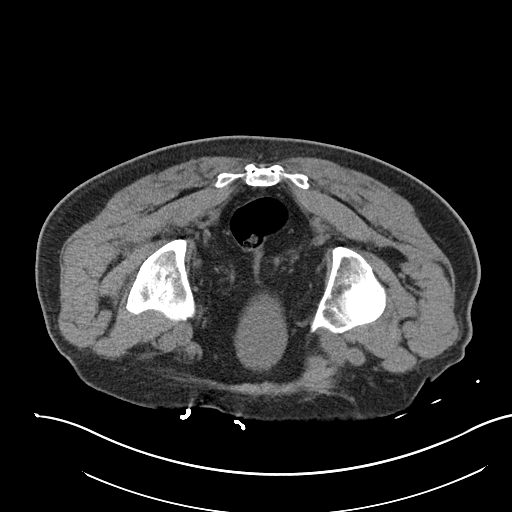
[im 3/35  bone]
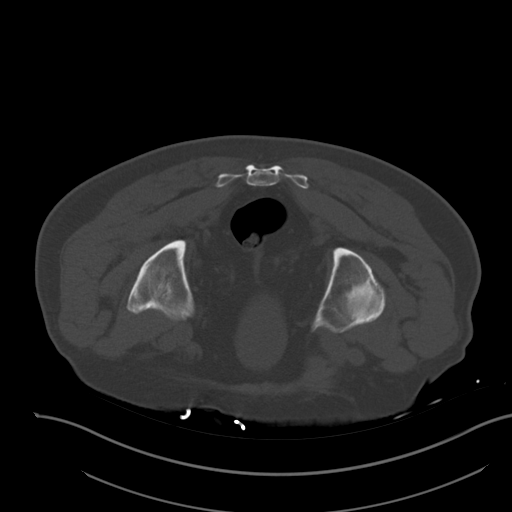
[im 6/35  bone]
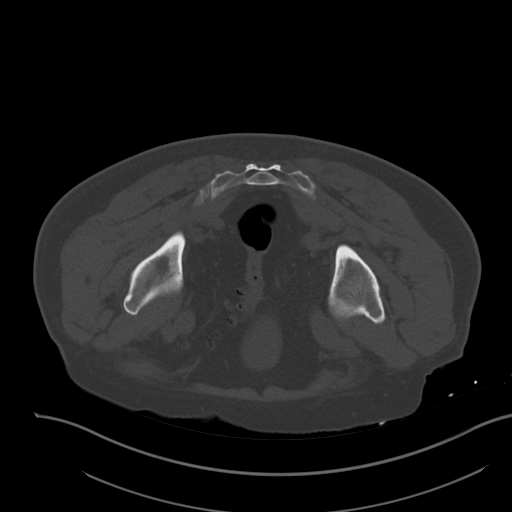
[im 9/35  bone]
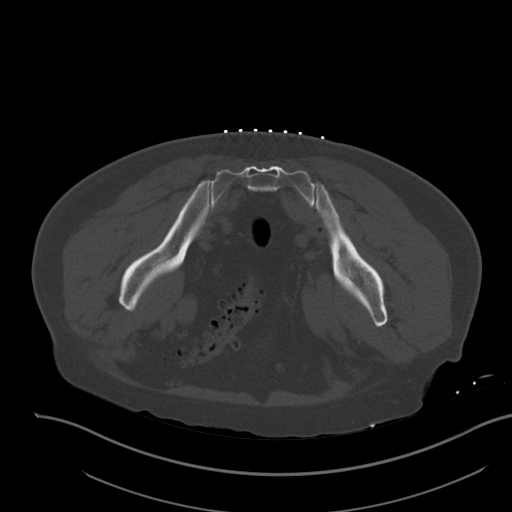
[im 12/35  bone]
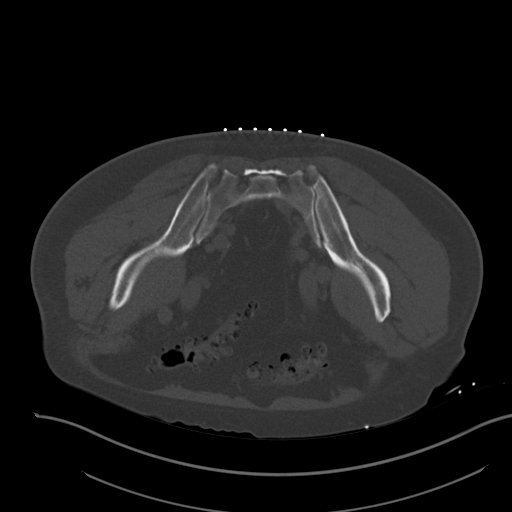
[im 15/35  soft-tissue]
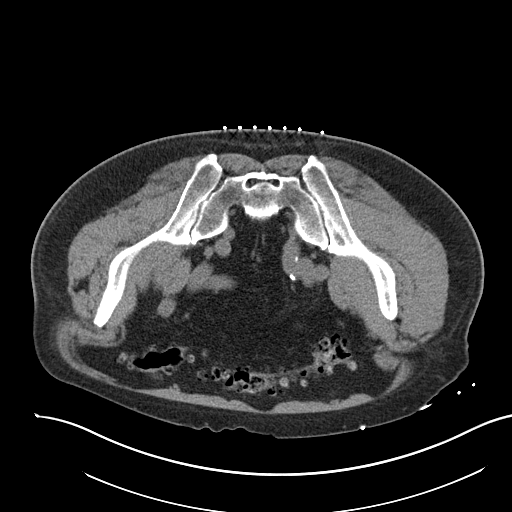
[im 15/35  bone]
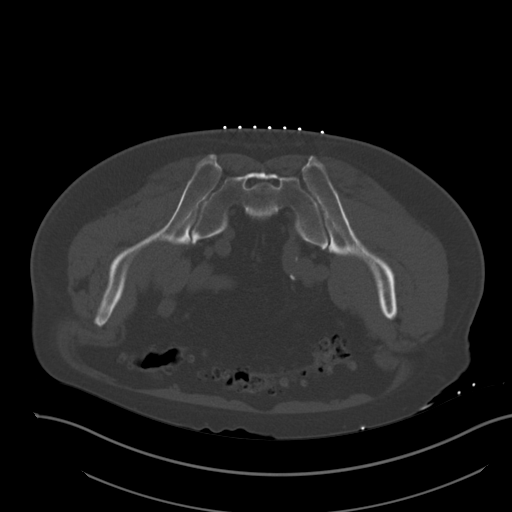
[im 18/35  bone]
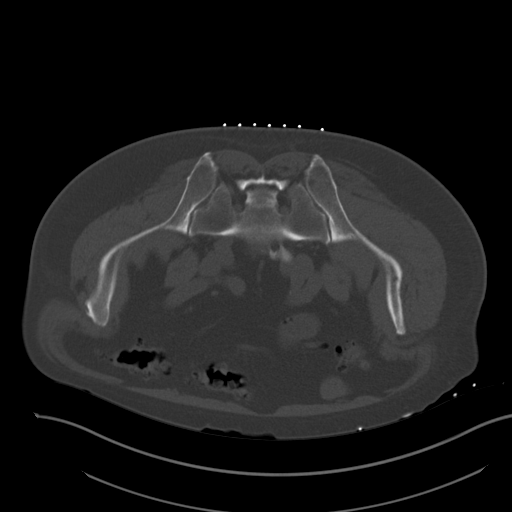
[im 20/35  bone]
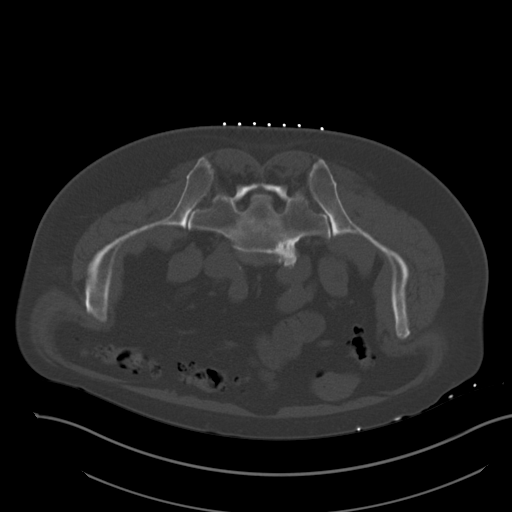
[im 23/35  bone]
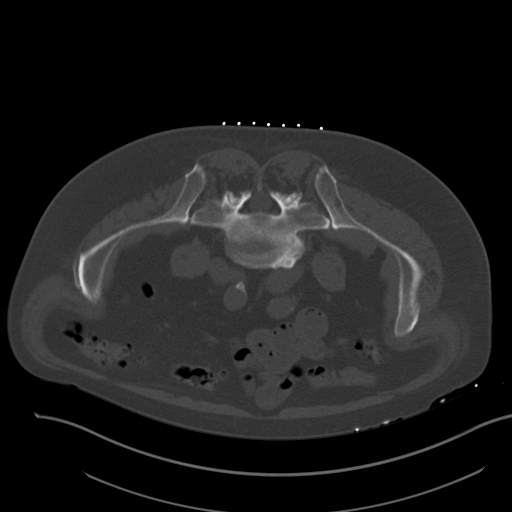
[im 26/35  soft-tissue]
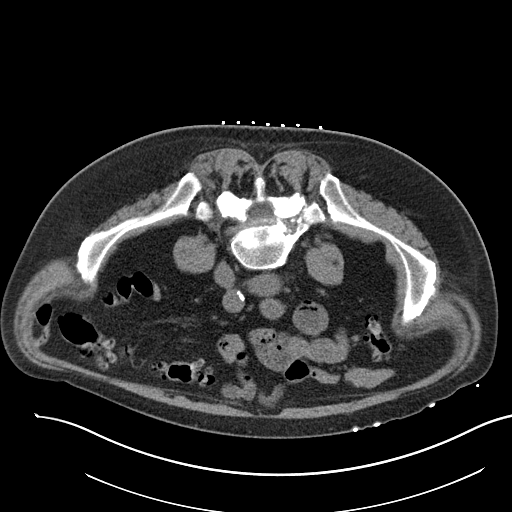
[im 26/35  bone]
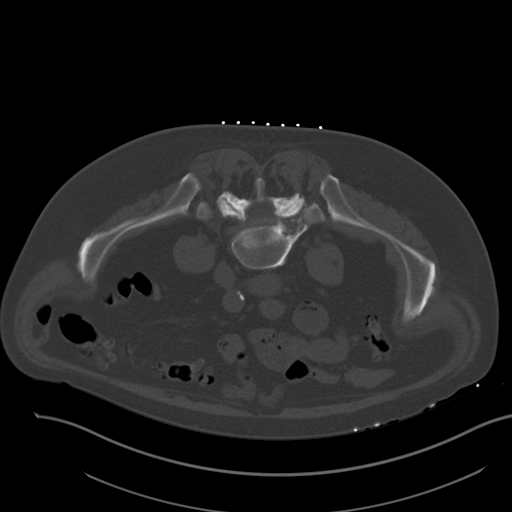
[im 29/35  bone]
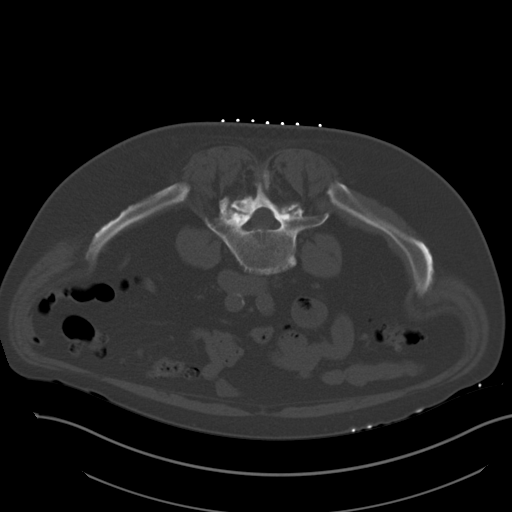
[im 32/35  bone]
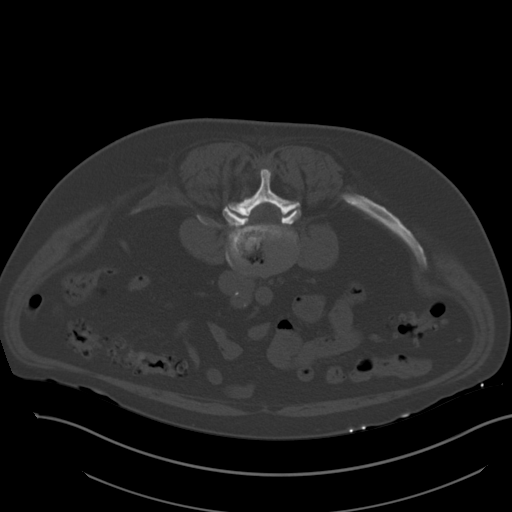

[11 of 14 positions shown; findings below may reference images not displayed]

EXAM:
CT GUIDED BONE MARROW ASPIRATES AND BIOPSY

MEDICATIONS:
None.

ANESTHESIA/SEDATION:
Fentanyl 50 mcg IV; Versed 1.0 mg IV

Moderate Sedation Time:  12 minutes

The patient was continuously monitored during the procedure by the
interventional radiology nurse under my direct supervision.

COMPLICATIONS:
None immediate.

PROCEDURE:
The procedure was explained to the patient. The risks and benefits
of the procedure were discussed and the patient's questions were
addressed. Informed consent was obtained from the patient. The
patient was placed prone on CT table. Images of the pelvis were
obtained. The right side of back was prepped and draped in sterile
fashion. The skin and right posterior ilium were anesthetized with
1% lidocaine. 11 gauge bone needle was directed into the right ilium
with CT guidance. Two aspirates and one core biopsy were obtained.
Bandage placed over the puncture site.
FINDINGS: Biopsy needle directed in the posterior right ilium.
IMPRESSION: CT guided bone marrow aspiration and core biopsy.

## 2021-07-07 MED ORDER — FENTANYL CITRATE (PF) 100 MCG/2ML IJ SOLN
INTRAMUSCULAR | Status: AC
  Filled 2021-07-07: qty 2

## 2021-07-07 MED ORDER — MIDAZOLAM HCL 2 MG/2ML IJ SOLN
INTRAMUSCULAR | Status: AC | PRN
  Administered 2021-07-07 (×2): 0.5 mg via INTRAVENOUS

## 2021-07-07 MED ORDER — SODIUM CHLORIDE 0.9 % IV SOLN
INTRAVENOUS | Status: DC | PRN
  Administered 2021-07-07 – 2021-07-08 (×2): 250 mL via INTRAVENOUS

## 2021-07-07 MED ORDER — ENOXAPARIN SODIUM 40 MG/0.4ML IJ SOSY
40.0000 mg | PREFILLED_SYRINGE | INTRAMUSCULAR | Status: DC
  Filled 2021-07-07: qty 0.4

## 2021-07-07 MED ORDER — MIDAZOLAM HCL 2 MG/2ML IJ SOLN
INTRAMUSCULAR | Status: AC
  Filled 2021-07-07: qty 2

## 2021-07-07 MED ORDER — HEPARIN SOD (PORK) LOCK FLUSH 100 UNIT/ML IV SOLN
INTRAVENOUS | Status: AC
  Filled 2021-07-07: qty 5

## 2021-07-07 MED ORDER — FENTANYL CITRATE (PF) 100 MCG/2ML IJ SOLN
INTRAMUSCULAR | Status: AC | PRN
  Administered 2021-07-07: 50 ug via INTRAVENOUS

## 2021-07-07 NOTE — Procedures (Signed)
Interventional Radiology Procedure:   Indications: Leukocytosis/monocytosis with peripheral blood smear concerning for myeloid neoplasm  Procedure: CT guided bone marrow biopsy  Findings: 2 aspirates and 1 core from right ilium  Complications: None     EBL: Minimal, less than 10 ml  Plan: Bedrest 1 hour   Vincent Harper R. Anselm Pancoast, MD  Pager: [phone]

## 2021-07-07 NOTE — H&P (Signed)
Chief Complaint: Patient was seen in consultation today for leukocytosis/monocytosis with peripheral blood smear concerning for myeloid neoplasm  Referring Physician(s): Dr. Randa Evens, MD, MPH  Supervising Physician: Markus Daft  Patient Status: [facility] - In-pt  History of Present Illness: Vincent Harper is a 85 y.o. male with past medical history including right lower extremity DVT in which the patient has been on Xarelto with no complaints, hypertension, hyperlipidemia and squamous cell carcinoma of the skin.  The patient presented to the emergency department on 07/06/2021 in the morning with complaints of right anterior proximal leg pain and subjective fevers.  The patient underwent MRI imaging and laboratory evaluation which revealed leukocytosis/monocytosis with peripheral blood smear concerning for myeloid neoplasm and the patient was evaluated by oncology with request received for bone marrow biopsy.  Today the patient states his right anterior leg pain has slightly improved he does complain of some chest tightness secondary to a cough and denies any other symptoms. The patient denies any previous complications with sedation.    Past Medical History:  Diagnosis Date   History of colon polyps    Hypercholesteremia    Per [place] New Patient Packet.   Hyperlipidemia    Hypertension    Per [facility] New Patient Packet.   Post-nasal drip    Per [place] Patient Packet.   Squamous cell carcinoma of skin 12/21/2020   Right mid helix, [facility]   Squamous cell carcinoma of skin 12/21/2020   in situ, [place] lower lip, needs appt for LN2 followed by 5FU   Squamous cell carcinoma of skin 02/10/2021   right dorsum hand, [facility]     Past Surgical History:  Procedure Laterality Date   COLONOSCOPY  11/28/2016   [facility], Per [facility] New Patient Packet.   EAR BIOPSY     and lower lip   HERNIA REPAIR     umbilical and inguinal (unsure what side) hernia   NOSE SURGERY     [place], Per [facility] New Patient Packet.    TONSILLECTOMY     Per [facility] New Patient Packet.    Allergies: Patient has no known allergies.  Medications: Prior to Admission medications   Medication Sig Start Date End Date Taking? Authorizing Provider  acetaminophen (TYLENOL) 500 MG tablet Take 500-1,000 mg by mouth every 6 (six) hours as needed for mild pain.   Yes [provider]  enalapril (VASOTEC) 20 MG tablet TAKE 1 TABLET(20 MG) BY MOUTH EVERY MORNING 02/12/21  Yes [name], NP  erythromycin ophthalmic ointment Place 1 application into both eyes 3 (three) times daily. Instill ~1 cm ribbon into both eyes three times daily for 1 week 07/01/21  Yes Eubanks, Carlos American, NP  fluticasone (FLONASE) 50 MCG/ACT nasal spray Place 1 spray into both nostrils daily as needed for allergies or rhinitis. 12/26/19  Yes [name], NP  Iron, Ferrous Sulfate, 325 (65 Fe) MG TABS Take 325 mg by mouth daily. 06/30/21  Yes [name], NP  pantoprazole (PROTONIX) 40 MG tablet TAKE 1 TABLET(40 MG) BY MOUTH DAILY Patient taking differently: Take 40 mg by mouth daily as needed (acid reflux symptoms). 06/30/21  Yes [name], NP  sildenafil (VIAGRA) 100 MG tablet Take 1 tablet (100 mg total) by mouth daily as needed for erectile dysfunction. 03/12/20  Yes [name], NP  simvastatin (ZOCOR) 20 MG tablet TAKE 1 TABLET(20 MG) BY MOUTH AT BEDTIME 05/10/21  Yes [name], NP  Undecylenic Ac-Zn Undecylenate (FUNGI-NAIL TOE &  FOOT EX) Apply 1 application topically as needed (toenail fungus).   Yes [provider]  XARELTO 20 MG TABS tablet TAKE 1 TABLET(20 MG) BY MOUTH AT BEDTIME 02/23/21  Yes [name], NP  fluorouracil (EFUDEX) 5 % cream Apply to lower lip twice a day x 1-2 weeks as directed. Patient not taking: No sig reported 02/10/21   [name], MD  hydrochlorothiazide (HYDRODIURIL) 25 MG tablet Take 1 tablet (25 mg total) by mouth daily. Patient not taking: No sig reported 10/13/20    [name], NP     Family History  Problem Relation Age of Onset   Hypertension Mother    Dementia Father    Diabetes Daughter     Social History   Socioeconomic History   Marital status: Married    Spouse name: Wellington Winegarden   Number of children: 3   Years of education: Not on file   Highest education level: Not on file  Occupational History   Not on file  Tobacco Use   Smoking status: Never   Smokeless tobacco: Never  Vaping Use   Vaping Use: Never used  Substance and Sexual Activity   Alcohol use: Yes    Alcohol/week: 1.0 standard drink    Types: 1 Cans of beer per week    Comment: 1 Beer Every 6 months.    Drug use: No   Sexual activity: Yes  Other Topics Concern   Not on file  Social History Narrative   Tobacco use, amount per day now: None   Past tobacco use, amount per day: None   How many years did you use tobacco: None   Alcohol use (drinks per week): 1 Beer every 6 months.   Diet:   Do you drink/eat things with caffeine: Diet Pepsi   Marital status: Married                                 What year were you married? 1964   Do you live in a house, apartment, assisted living, condo, trailer, etc.? Villa in [place].   Is it one or more stories? 1   How many persons live in your home? 2   Do you have pets in your home?( please list) No   Highest Level of education completed: College   Current or past profession:    Do you exercise? Yes                                   Type and how often? Walk Daily.   Do you have a living will? Yes in 2020   Do you have a DNR form? No                                  If not, do you want to discuss one?   Do you have signed POA/HPOA forms? Yes in 2020                        If so, please bring to you appointment    Do you have any difficulty bathing or dressing yourself? No    Do you have difficulty preparing food or eating? No   Do you have difficulty managing your medications? No  Do you have any  difficulty managing your finances? No   Do you have any difficulty affording your medications? No      Social Determinants of Radio broadcast assistant Strain: Not on file  Food Insecurity: Not on file  Transportation Needs: Not on file  Physical Activity: Not on file  Stress: Not on file  Social Connections: Not on file    Review of Systems: A 12 point ROS discussed and pertinent positives are indicated in the HPI above.  All other systems are negative.  Review of Systems  Vital Signs: BP (!) 111/58   Pulse 81   Temp 99.8 F (37.7 C) (Oral)   Resp 20   Wt 201 lb 8 oz (91.4 kg)   SpO2 91%   BMI 31.56 kg/m   Physical Exam Constitutional:      Appearance: Normal appearance.  HENT:     Head: Normocephalic and atraumatic.  Cardiovascular:     Rate and Rhythm: Normal rate.     Heart sounds: Normal heart sounds.  Pulmonary:     Effort: Pulmonary effort is normal. No respiratory distress.     Breath sounds: Normal breath sounds.  Neurological:     Mental Status: He is alert and oriented to person, place, and time.  Psychiatric:        Mood and Affect: Mood normal.        Behavior: Behavior normal.    Imaging: DG Lumbar Spine Complete  Result Date: 07/06/2021 CLINICAL DATA:  Right groin pain. EXAM: LUMBAR SPINE - COMPLETE 4+ VIEW COMPARISON:  None. FINDINGS: Mild grade 1 retrolisthesis of L2-3 is noted secondary to severe degenerative disc disease at this level. No fracture is noted. Moderate degenerative disc disease is also noted at L4-5 and L5-S1. Degenerative changes are seen involving posterior facet joints of L5-S1. IMPRESSION: Multilevel degenerative disc disease. No acute abnormality is noted. Aortic Atherosclerosis (ICD10-I70.0). Electronically Signed   By: Marijo Conception M.D.   On: 07/06/2021 08:48   MR Lumbar Spine W Wo Contrast  Result Date: 07/06/2021 CLINICAL DATA:  Lumbar radiculopathy, cancer or infection suspected EXAM: MRI LUMBAR SPINE WITHOUT AND WITH  CONTRAST TECHNIQUE: Multiplanar and multiecho pulse sequences of the lumbar spine were obtained without and with intravenous contrast. CONTRAST:  3m GADAVIST GADOBUTROL 1 MMOL/ML IV SOLN COMPARISON:  Lumbar radiographs from the same day. FINDINGS: Segmentation: Standard segmentation is assumed with the inferior-most fully formed intervertebral disc labeled L5-S1. Alignment: Grade 1 retrolisthesis of L2 on L3 and grade 1 anterolisthesis of L5 on S1. Slight retrolisthesis of L3 on L4 and L4 on L5. Dextrocurvature of the upper lumbar spine with levocurvature of the lower lumbar spine. Vertebrae: Degenerative/discogenic endplate signal changes about the left L2-L3 and L5-S1 and right L4-L5 disc spaces, where there is disc space narrowing. Otherwise, no focal marrow edema to suggest acute fracture or discitis/osteomyelitis. Mild disc edema and enhancement at the sites of degenerative disc space narrowing described above. Mildly heterogeneous marrow without suspicious bone lesion. Conus medullaris and cauda equina: Conus extends to the L1-L2 level. Conus appears normal. No abnormal enhancement of the conus or cauda equina. Fatty filum terminalis. Paraspinal and other soft tissues: Bilateral renal cysts. No appreciable paraspinal edema. Disc levels: T11-T12: Only imaged sagittally without evidence of significant canal or foraminal stenosis. T12-L1: No significant disc protrusion, foraminal stenosis, or canal stenosis. L1-L2: Mild disc bulging and facet arthropathy without significant canal or foraminal stenosis. L2-L3: Left eccentric degenerative disc disease. Grade 1  retrolisthesis of L2 on L3. Disc bulging, including biforaminal disc protrusions. Mild to moderate right greater than left foraminal stenosis. Mild bilateral subarticular recess narrowing without significant canal stenosis. L3-L4: Slight retrolisthesis of L3 on L4. Disc bulging, including biforaminal disc protrusions. Moderate bilateral facet hypertrophy.  Resulting mild to moderate right greater than left foraminal stenosis. Mild canal stenosis and right greater than left subarticular recess stenosis L4-L5: Right eccentric degenerative disc disease. Slight retrolisthesis of L4 on L5. Disc bulging, including biforaminal disc protrusions, and ligamentum flavum thickening. Resulting moderate bilateral foraminal stenosis. Mild canal stenosis with right greater than left subarticular recess stenosis. L5-S1: Grade 1 anterolisthesis of L5 on S1. Broad disc bulge with moderate bilateral facet hypertrophy. Resulting moderate left and mild right foraminal stenosis. IMPRESSION: 1. Moderate foraminal stenosis bilaterally at L4-L5 and on the left at L5-S1. Mild-to-moderate right greater than left foraminal stenosis at L2-L3 and L3-L4. 2. Mild canal stenosis at L3-L4 and L4-L5 with right greater than left subarticular recess narrowing. 3. Degenerative disc disease greatest at L2-L3, L4-L5 and L5-S1, detailed above. Electronically Signed   By: Margaretha Sheffield MD   On: 07/06/2021 12:23   MR HIP RIGHT W WO CONTRAST  Result Date: 07/06/2021 CLINICAL DATA:  Right groin pain. EXAM: MRI OF THE RIGHT HIP WITHOUT AND WITH CONTRAST TECHNIQUE: Multiplanar, multisequence MR imaging was performed both before and after administration of intravenous contrast. CONTRAST:  70m GADAVIST GADOBUTROL 1 MMOL/ML IV SOLN COMPARISON:  Right hip x-rays from same day. FINDINGS: Bones: There is no evidence of acute fracture, dislocation or avascular necrosis. No focal bone lesion. The visualized sacroiliac joints and symphysis pubis appear normal. Articular cartilage and labrum Articular cartilage: No focal chondral defect or subchondral signal abnormality identified. Labrum: Grossly intact, although evaluation is limited due to lack of intra-articular fluid. No paralabral abnormality. Joint or bursal effusion Joint effusion: Trace right hip joint effusion. Bursae: No focal periarticular fluid  collection. Muscles and tendons Muscles and tendons: Partial tear of the right gluteus medius tendon (series 8, image 12). The bilateral hamstring and iliopsoas tendons appear normal. Mild edema and enhancement in the proximal vastus lateralis muscle. More prominent myofascial edema and enhancement around the lesser trochanter. Other findings Miscellaneous: Severe colonic diverticulosis. Bilateral renal cysts. IMPRESSION: 1. Myofascial edema/inflammation around the right intertrochanteric femur is nonspecific, but concerning for infection given leukocytosis, especially if there is no history of trauma. No drainable fluid collection. 2. Trace right hip joint effusion without convincing evidence of septic arthritis. No osteomyelitis. 3. Partial tear of the right gluteus medius tendon. Electronically Signed   By: WTitus DubinM.D.   On: 07/06/2021 12:39   UKoreaVenous Img Lower Unilateral Right  Result Date: 07/06/2021 CLINICAL DATA:  Right lower extremity pain 3-4 weeks, history of positive DVT in 2016 EXAM: RIGHT LOWER EXTREMITY VENOUS DOPPLER ULTRASOUND TECHNIQUE: Gray-scale sonography with graded compression, as well as color Doppler and duplex ultrasound were performed to evaluate the lower extremity deep venous systems from the level of the common femoral vein and including the common femoral, femoral, profunda femoral, popliteal and calf veins including the posterior tibial, peroneal and gastrocnemius veins when visible. The superficial great saphenous vein was also interrogated. Spectral Doppler was utilized to evaluate flow at rest and with distal augmentation maneuvers in the common femoral, femoral and popliteal veins. COMPARISON:  March 2016 FINDINGS: Contralateral Common Femoral Vein: Respiratory phasicity is normal and symmetric with the symptomatic side. No evidence of thrombus. Normal compressibility. Common Femoral Vein: No  evidence of thrombus. Normal compressibility, respiratory phasicity and  response to augmentation. Saphenofemoral Junction: No evidence of thrombus. Normal compressibility and flow on color Doppler imaging. Profunda Femoral Vein: No evidence of thrombus. Normal compressibility and flow on color Doppler imaging. Femoral Vein: No evidence of thrombus. Normal compressibility, respiratory phasicity and response to augmentation. Popliteal Vein: No evidence of thrombus. Normal compressibility, respiratory phasicity and response to augmentation. Calf Veins: No evidence of thrombus. Normal compressibility and flow on color Doppler imaging. Other Findings:  None. IMPRESSION: No findings of acute or chronic right lower extremity deep venous thrombosis. Electronically Signed   By: Albin Felling MD   On: 07/06/2021 08:46   DG Chest Port 1 View  Result Date: 07/06/2021 CLINICAL DATA:  Right groin pain. EXAM: PORTABLE CHEST 1 VIEW COMPARISON:  CT chest and chest x-ray dated February 19, 2012. FINDINGS: The heart size and mediastinal contours are within normal limits. Low lung volumes are present, causing crowding of the pulmonary vasculature. Mild bibasilar atelectasis. No focal consolidation, pleural effusion, or pneumothorax. No acute osseous abnormality. IMPRESSION: 1. No active disease. Electronically Signed   By: Titus Dubin M.D.   On: 07/06/2021 14:54   DG Hip Unilat W or Wo Pelvis 2-3 Views Right  Result Date: 07/06/2021 CLINICAL DATA:  Right hip and groin pain EXAM: DG HIP (WITH OR WITHOUT PELVIS) 2-3V RIGHT COMPARISON:  None. FINDINGS: There is no evidence of hip fracture or dislocation. Mild hip joint space narrowing. Partially visualized lower lumbar spondylosis. IMPRESSION: Mild degenerative changes of the right hip.  No acute findings. Electronically Signed   By: Davina Poke D.O.   On: 07/06/2021 08:46    Labs:  CBC: Recent Labs    06/21/21 1532 06/29/21 0000 07/06/21 0717 07/07/21 0300  WBC 12.4* 10.6 17.1* 19.2*  HGB 11.0* 11.0* 10.4* 8.7*  HCT 32.7* 35* 32.1*  26.9*  PLT 153 196 148* 117*    COAGS: Recent Labs    07/06/21 0717  INR 2.4*    BMP: Recent Labs    11/16/20 0000 06/29/21 0000 07/06/21 0717 07/07/21 0300  NA 142 142 137 133*  K 3.7 3.9 3.7 3.9  CL 106 107 103 104  CO2 30* 26* 26 25  GLUCOSE  --   --  120* 138*  BUN 21 24* 28* 26*  CALCIUM  --  8.5* 8.4* 7.5*  CREATININE 1.0 1.1 1.42* 1.58*  GFRNONAA  --   --  49* 43*    LIVER FUNCTION TESTS: Recent Labs    11/16/20 0000 07/06/21 0717 07/07/21 0300  BILITOT  --  1.0 0.9  AST 15 16 14*  ALT 11 12 10   ALKPHOS  --  52 44  PROT  --  8.8* 7.7  ALBUMIN  --  2.7* 2.3*     Assessment and Plan: Presented to ED 8/9 with complaints of RLE pain, history of DVT on Xarelto - Last dose 8/8 evening  Fevers of unknown origin, blood cultures drawn and pending Leukocytosis/monocytosis with peripheral blood smear 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 All of the questions were answered and there is agreement to proceed.  Consent signed and in chart.    Thank you for this interesting consult.  I greatly enjoyed meeting Vincent Harper and look forward to participating in their care.  A copy of this report was sent to the requesting provider on this date.  Electronically Signed: Hedy Jacob, PA-C 07/07/2021, 8:49 AM   I spent a total of 20 Minutes in face to face in clinical consultation, greater than 50% of which was counseling/coordinating care for monocytosis.

## 2021-07-07 NOTE — Progress Notes (Signed)
Patient ID: Vincent Harper, male   DOB: December 11, 1935, 85 y.o.   MRN: [id number]  Subjective: Overall, the patient feels that his hip is somewhat better today as compared to yesterday.  He was able to get up and weight-bear on the right leg to go to the bathroom on several occasions, although he admits that it was painful.  He denies any new orthopedic issues, and denies any numbness or paresthesias down his leg to his foot.   Objective: Vital signs in last 24 hours: Temp:  [99.8 F (37.7 C)-102.7 F (39.3 C)] 99.8 F (37.7 C) (08/09 2014) Pulse Rate:  [70-87] 70 (08/10 1020) Resp:  [15-26] 26 (08/10 1020) BP: (90-115)/(46-60) 93/54 (08/10 1020) SpO2:  [87 %-100 %] 90 % (08/10 1020) Weight:  [91.4 kg] 91.4 kg (08/09 2000)  Intake/Output from previous day: 08/09 0701 - 08/10 0700 In: 1600.1 [IV Piggyback:1600.1] Out: -  Intake/Output this shift: No intake/output data recorded.  Recent Labs    07/06/21 0717 07/07/21 0300  HGB 10.4* 8.7*   Recent Labs    07/06/21 0717 07/06/21 1243 07/07/21 0300  WBC 17.1*  --  19.2*  RBC 3.16* 3.10* 2.60*  HCT 32.1*  --  26.9*  PLT 148*  --  117*   Recent Labs    07/06/21 0717 07/07/21 0300  NA 137 133*  K 3.7 3.9  CL 103 104  CO2 26 25  BUN 28* 26*  CREATININE 1.42* 1.58*  GLUCOSE 120* 138*  CALCIUM 8.4* 7.5*   Recent Labs    07/06/21 0717  INR 2.4*    Physical Exam: Orthopedic examination again is limited to the right hip and lower extremity.  Overall findings are essentially unchanged as compared to yesterday.  He still has difficulty performing an active straight leg raise, but is able to tolerate logrolling and is neurovascularly intact to the right lower extremity and foot.  Assessment: Acute onset of apparently atraumatic right hip pain of unclear etiology, somewhat improved symptomatically.  Plan: The treatment options have been reviewed with the patient.  At this point, the patient will be continue to be managed  nonoperatively/observationally from an orthopedic standpoint while the heme-onc team and hospitalist service try to sort out his multiple medical issues.  I recommend that he continue to be treated with broad-spectrum IV antibiotics at this time.  He may be mobilized with physical therapy, weightbearing as tolerated to the right lower extremity.  He may receive appropriate pain medication as deemed necessary.   Marshall Cork Vincent Harper 07/07/2021, 11:56 AM

## 2021-07-07 NOTE — Progress Notes (Signed)
PROGRESS NOTE  Vincent Harper  DOB: 1936/10/28  PCP: [name], NP [phone]  DOA: 07/06/2021  LOS: 1 day  Hospital Day: 2   Chief Complaint  Patient presents with   Leg Pain    Groin Pain     Brief narrative: Vincent Harper is a 85 y.o. 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 Patient presented to ED on 8/9 with progressively worsening right hip pain for 2 weeks associated with intermittent fever of 101-102 at home.  He was unable to put weight on this R hip without experiencing pain and discomfort.   In the ED, he had a temperature 102, WBC count 17,000 with blast cells. Creatinine 1.42 MRI of his right hip showed myofascial edema/inflammation around the right intertrochanteric femur which is nonspecific, per radiology report, but concerning for infection given leukocytosis, especially if there is no history of trauma.  No drainage fluid collection.  Trace right hip joint effusion without convincing evidence of septic arthritis.  No osteomyelitis.  Partial tear of the right gluteus medius tendon.  Patient was admitted to hospital service Orthopedic surgery and hematology services were consulted.    Subjective: Patient was seen and examined this afternoon. Pleasant elderly Caucasian male.  Sitting up at the edge of the bed.  Not in distress.  Daughters at bedside.  Assessment/Plan: Right hip pain  Partial tear of right gluteus medius tendon -Presented with progressively worsening right hip pain for 2 weeks associated with intermittent fever. -Suspected infective versus inflammatory process.  -Imaging rule out fracture but showed partial tear of the right gluteus medius tendon -Seen by orthopedic surgery.  Nonoperative management recommended. -Currently  on pain control with as needed oxycodone. PT eval ordered.  SIRS -Presented with fever, has leukocytosis.  No clear source of infection.  Fever could be secondary to hematologic malignancy as well. -For now we will continue broad-spectrum antibiotics. Recent Labs  Lab 07/06/21 0717 07/06/21 0744 07/06/21 1226 07/07/21 0300  WBC 17.1*  --   --  19.2*  LATICACIDVEN  --  1.0 1.2  --   PROCALCITON  --   --   --  0.70   Abnormal blood smear Monocytosis -Initial blood work showed WC count elevated to 17,000 with monocytosis -Heme-onc consultation obtained.  CMML is high on differential.  Underwent bone marrow biopsy today.  Microcytic anemia Thrombocytopenia -Continue to monitor.  Probably related to hematological malignancy if proven so. Recent Labs  Lab 07/06/21 0717 07/07/21 0300  WBC 17.1* 19.2*  NEUTROABS 4.6 3.3  HGB 10.4* 8.7*  HCT 32.1* 26.9*  MCV 101.6* 103.5*  PLT 148* 117*   AKI -Creatinine normal at baseline, elevated to 1.42 at presentation.  Further elevated to 1.5 today.  Continue IV fluid.  Repeat creatinine tomorrow. Recent Labs    11/16/20 0000 06/29/21 0000 07/06/21 0717 07/07/21 0300  BUN 21 24* 28* 26*  CREATININE 1.0 1.1 1.42* 1.58*   History of right lower extremity DVT -In 2016, patient had unprovoked right lower extremity DVT.  No PE.  He reports subsequent genetic work-up were negative.  No recurrence of DVT.  He however has been continued on Xarelto for all these years.  I am not sure if he needs to continue Xarelto going forward.   -If proven to have malignancy, he may be at higher risk of recurrence  of DVT but at the same time and the risk of bleeding as well because of low platelet count..  We will discuss this with oncologist.  Mobility: Encourage ambulation Code Status:   Code Status: Full Code  Nutritional status: Body mass index is 31.56 kg/m.     Diet:  Diet Order             Diet Heart Room service appropriate? Yes; Fluid  consistency: Thin  Diet effective now                  DVT prophylaxis:  enoxaparin (LOVENOX) injection 40 mg Start: 07/07/21 1000   Antimicrobials: Broad-spectrum antibiotics Fluid: LR@50  mill per hour Consultants: Oncology, orthopedics Family Communication: Daughter is at bedside  Status is: Inpatient  Remains inpatient appropriate because: Malignancy work-up  Dispo: The patient is from: Home              Anticipated d/c is to: Home in 2 to 3 days              Patient currently is not medically stable to d/c.   Difficult to place patient No     Infusions:   ceFEPime (MAXIPIME) IV Stopped (07/07/21 0303)   lactated ringers 50 mL/hr at 07/07/21 0232   vancomycin      Scheduled Meds:  enoxaparin (LOVENOX) injection  40 mg Subcutaneous Q24H   erythromycin  1 application Both Eyes TID   fentaNYL       ferrous sulfate  325 mg Oral Daily   heparin lock flush       midazolam       simvastatin  20 mg Oral q1800    Antimicrobials: Anti-infectives (From admission, onward)    Start     Dose/Rate Route Frequency Ordered Stop   07/07/21 2100  vancomycin (VANCOREADY) IVPB 750 mg/150 mL        750 mg 150 mL/hr over 60 Minutes Intravenous Every 24 hours 07/06/21 2023     07/07/21 0300  ceFEPIme (MAXIPIME) 2 g in sodium chloride 0.9 % 100 mL IVPB        2 g 200 mL/hr over 30 Minutes Intravenous Every 12 hours 07/06/21 2018     07/06/21 2030  vancomycin (VANCOCIN) IVPB 1000 mg/200 mL premix        1,000 mg 200 mL/hr over 60 Minutes Intravenous  Once 07/06/21 2023 07/06/21 2147   07/06/21 1400  vancomycin (VANCOCIN) IVPB 1000 mg/200 mL premix        1,000 mg 200 mL/hr over 60 Minutes Intravenous  Once 07/06/21 1345 07/06/21 1503   07/06/21 1400  ceFEPIme (MAXIPIME) 2 g in sodium chloride 0.9 % 100 mL IVPB        2 g 200 mL/hr over 30 Minutes Intravenous  Once 07/06/21 1345 07/06/21 1532       PRN meds: acetaminophen, fluticasone, HYDROmorphone (DILAUDID) injection,  ondansetron (ZOFRAN) IV, oxyCODONE, pantoprazole, polyethylene glycol   Objective: Vitals:   07/07/21 1020 07/07/21 1200  BP: (!) 93/54 98/66  Pulse: 70 70  Resp: (!) 26 20  Temp:    SpO2: 90% 93%    Intake/Output Summary (Last 24 hours) at 07/07/2021 1620 Last data filed at 07/07/2021 0303 Gross per 24 hour  Intake 300 ml  Output --  Net 300 ml   Filed Weights   07/06/21 2000  Weight: 91.4 kg   Weight change:  Body mass index is 31.56 kg/m.   Physical Exam: General exam: Pleasant, elderly  Caucasian male.  Not in distress Skin: No rashes, lesions or ulcers. HEENT: Atraumatic, normocephalic, no obvious bleeding Lungs: Clear to auscultation bilaterally CVS: Regular rate and rhythm, no murmur GI/Abd soft, nontender, nondistended, bowel sounds CNS: Alert, awake, oriented x3 Psychiatry: Mood appropriate Extremities: No pedal edema, no calf tenderness  Data Review: I have personally reviewed the laboratory data and studies available.  Recent Labs  Lab 07/06/21 0717 07/07/21 0300  WBC 17.1* 19.2*  NEUTROABS 4.6 3.3  HGB 10.4* 8.7*  HCT 32.1* 26.9*  MCV 101.6* 103.5*  PLT 148* 117*   Recent Labs  Lab 07/06/21 0717 07/06/21 1243 07/07/21 0300  NA 137  --  133*  K 3.7  --  3.9  CL 103  --  104  CO2 26  --  25  GLUCOSE 120*  --  138*  BUN 28*  --  26*  CREATININE 1.42*  --  1.58*  CALCIUM 8.4*  --  7.5*  MG  --   --  1.9  PHOS  --  2.8  --     F/u labs ordered Unresulted Labs (From admission, onward)     Start     Ordered   07/08/21 0500  Creatinine, serum  Tomorrow morning,   STAT        07/07/21 1205   07/08/21 0500  CBC with Differential/Platelet  Daily,   STAT      07/07/21 1340   07/08/21 3073  Basic metabolic panel  Daily,   STAT      07/07/21 1340   07/06/21 1214  BCR-ABL1 FISH  Once,   STAT        07/06/21 1213   07/06/21 1214  IntelliGEN Myeloid  Once,   STAT        07/06/21 1213   07/06/21 1213  Flow cytometry panel-leukemia/lymphoma  work-up  Once,   STAT        07/06/21 1212            Signed, Terrilee Croak, MD [facility] 07/07/2021

## 2021-07-07 NOTE — ED Notes (Signed)
Family contacted for update [phone]

## 2021-07-07 NOTE — Plan of Care (Signed)

## 2021-07-07 NOTE — Progress Notes (Signed)
Patient clinically stable post BMB per Dr Anselm Pancoast, tolerated well. Denies complaints post procedure. Post recovery, bedside report given Ronalee Belts RN in ER with questions answered. Received Versed 1 mg along with Fentanyl 50 mcg IV for procedure.

## 2021-07-08 ENCOUNTER — Telehealth: Payer: Self-pay | Admitting: *Deleted

## 2021-07-08 DIAGNOSIS — R509 Fever, unspecified: Secondary | ICD-10-CM

## 2021-07-08 DIAGNOSIS — D72821 Monocytosis (symptomatic): Secondary | ICD-10-CM | POA: Diagnosis not present

## 2021-07-08 LAB — CBC WITH DIFFERENTIAL/PLATELET
Abs Immature Granulocytes: 0.26 10*3/uL — ABNORMAL HIGH (ref 0.00–0.07)
Basophils Absolute: 0 10*3/uL (ref 0.0–0.1)
Basophils Relative: 0 %
Eosinophils Absolute: 0.1 10*3/uL (ref 0.0–0.5)
Eosinophils Relative: 0 %
HCT: 29.3 % — ABNORMAL LOW (ref 39.0–52.0)
Hemoglobin: 9.6 g/dL — ABNORMAL LOW (ref 13.0–17.0)
Immature Granulocytes: 1 %
Lymphocytes Relative: 6 %
Lymphs Abs: 1.6 10*3/uL (ref 0.7–4.0)
MCH: 33.8 pg (ref 26.0–34.0)
MCHC: 32.8 g/dL (ref 30.0–36.0)
MCV: 103.2 fL — ABNORMAL HIGH (ref 80.0–100.0)
Monocytes Absolute: 21.1 10*3/uL — ABNORMAL HIGH (ref 0.1–1.0)
Monocytes Relative: 78 %
Neutro Abs: 4 10*3/uL (ref 1.7–7.7)
Neutrophils Relative %: 15 %
Platelets: 125 10*3/uL — ABNORMAL LOW (ref 150–400)
RBC: 2.84 MIL/uL — ABNORMAL LOW (ref 4.22–5.81)
RDW: 15.6 % — ABNORMAL HIGH (ref 11.5–15.5)
Smear Review: NORMAL
WBC: 27 10*3/uL — ABNORMAL HIGH (ref 4.0–10.5)
nRBC: 0.3 % — ABNORMAL HIGH (ref 0.0–0.2)

## 2021-07-08 LAB — BASIC METABOLIC PANEL WITH GFR
Anion gap: 7 (ref 5–15)
BUN: 28 mg/dL — ABNORMAL HIGH (ref 8–23)
CO2: 25 mmol/L (ref 22–32)
Calcium: 7.9 mg/dL — ABNORMAL LOW (ref 8.9–10.3)
Chloride: 102 mmol/L (ref 98–111)
Creatinine, Ser: 1.4 mg/dL — ABNORMAL HIGH (ref 0.61–1.24)
GFR, Estimated: 50 mL/min — ABNORMAL LOW
Glucose, Bld: 131 mg/dL — ABNORMAL HIGH (ref 70–99)
Potassium: 4.1 mmol/L (ref 3.5–5.1)
Sodium: 134 mmol/L — ABNORMAL LOW (ref 135–145)

## 2021-07-08 MED ORDER — RIVAROXABAN 20 MG PO TABS
20.0000 mg | ORAL_TABLET | Freq: Every day | ORAL | Status: DC
  Administered 2021-07-08 – 2021-07-09 (×2): 20 mg via ORAL
  Filled 2021-07-08 (×3): qty 1

## 2021-07-08 NOTE — Progress Notes (Signed)
PROGRESS NOTE  Oskar Cretella  DOB: 1935/12/22  PCP: [name], NP [phone]  DOA: 07/06/2021  LOS: 2 days  Hospital Day: 3   Chief Complaint  Patient presents with   Leg Pain    Groin Pain     Brief narrative: Vincent Harper is a 85 y.o. 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 Patient presented to ED on 8/9 with progressively worsening right hip pain for 2 weeks associated with intermittent fever of 101-102 at home.  He was unable to put weight on this R hip without experiencing pain and discomfort.   In the ED, he had a temperature 102, WBC count 17,000 with blast cells. Creatinine 1.42 MRI of his right hip showed myofascial edema/inflammation around the right intertrochanteric femur which is nonspecific, per radiology report, but concerning for infection given leukocytosis, especially if there is no history of trauma.  No drainage fluid collection.  Trace right hip joint effusion without convincing evidence of septic arthritis.  No osteomyelitis.  Partial tear of the right gluteus medius tendon.  Patient was admitted to hospital service Orthopedic surgery and hematology services were consulted.    Subjective: Patient was seen and examined this morning.  Sitting up in chair.  Not in distress.  Family at bedside. Maximum temperature 99.6 in last 24 hours. Labs from this morning with improving creatinine, rising WBC count.  Assessment/Plan: Right hip pain  Partial tear of right gluteus medius tendon -Presented with progressively worsening right hip pain for 2 weeks associated with intermittent fever. -Suspected infective versus inflammatory process.  -Imaging rule out fracture but showed partial tear of the right gluteus medius tendon -Seen by orthopedic  surgery.  Nonoperative management recommended. -Currently on pain control with as needed oxycodone. PT eval ordered.  SIRS -Presented with fever, has leukocytosis.  No clear source of infection.  There is an area of myofascial edema/inflammation on the right intertrochanteric femur which is nonspecific.  Fever could be secondary to hematologic malignancy as well. -Currently patient is on broad-spectrum antibiotic coverage with IV vancomycin and IV cefepime. -WBC count is further up to 27,000 today.  In the absence of fever, I would think this is related to hematological malignancy.  We will continue to monitor. -[place] his body multiple times.  He wants to be checked for Lyme disease.  I ordered for Lyme serology this morning. Recent Labs  Lab 07/06/21 0717 07/06/21 0744 07/06/21 1226 07/07/21 0300 07/08/21 0445  WBC 17.1*  --   --  19.2* 27.0*  LATICACIDVEN  --  1.0 1.2  --   --   PROCALCITON  --   --   --  0.70  --    Abnormal blood smear Monocytosis -Initial blood work showed WC count elevated to 17,000 with monocytosis -Heme-onc consultation obtained.  CMML is high on differential.  Underwent bone marrow biopsy on 8/10.  Pending report.  Microcytic anemia Thrombocytopenia -Continue to monitor.  Probably related to hematological malignancy if proven so. Recent Labs  Lab 07/06/21 0717 07/07/21 0300 07/08/21 0445  WBC 17.1* 19.2* 27.0*  NEUTROABS 4.6 3.3 4.0  HGB 10.4* 8.7* 9.6*  HCT 32.1* 26.9* 29.3*  MCV 101.6* 103.5* 103.2*  PLT 148* 117* 125*   AKI -Creatinine normal at baseline, elevated to peak  at 1.58, down to 1.4 today.  Continue IV hydration.   Recent Labs    11/16/20 0000 06/29/21 0000 07/06/21 0717 07/07/21 0300 07/08/21 0445  BUN 21 24* 28* 26* 28*  CREATININE 1.0 1.1 1.42* 1.58* 1.40*   History of right lower extremity DVT -In 2016, patient had unprovoked right lower extremity DVT.  No PE.  He  reports subsequent genetic work-up were negative.  No recurrence of DVT.  He however continues to take Xarelto.  Discussed with oncologist Dr. Janese Banks.   -At this time, patient is at higher risk of recurrence of DVT because of underlying malignancy.  His platelet counts is on the low side of normal but is stable.  We will continue Xarelto at this time.  Mobility: Encourage ambulation Code Status:   Code Status: Full Code  Nutritional status: Body mass index is 31.59 kg/m.     Diet:  Diet Order             Diet Heart Room service appropriate? Yes; Fluid consistency: Thin  Diet effective now                  DVT prophylaxis:  enoxaparin (LOVENOX) injection 40 mg Start: 07/07/21 1000   Antimicrobials: Broad-spectrum antibiotics Fluid: LR@50  mill per hour Consultants: Oncology, orthopedics Family Communication: Daughter is at bedside  Status is: Inpatient  Remains inpatient appropriate because: Malignancy work-up continues  Dispo: The patient is from: Home              Anticipated d/c is to: Home in 2 to 3 days              Patient currently is not medically stable to d/c.   Difficult to place patient No     Infusions:   sodium chloride Stopped (07/08/21 0334)   ceFEPime (MAXIPIME) IV Stopped (07/08/21 0252)   vancomycin Stopped (07/07/21 2326)    Scheduled Meds:  enoxaparin (LOVENOX) injection  40 mg Subcutaneous Q24H   erythromycin  1 application Both Eyes TID   ferrous sulfate  325 mg Oral Daily   simvastatin  20 mg Oral q1800    Antimicrobials: Anti-infectives (From admission, onward)    Start     Dose/Rate Route Frequency Ordered Stop   07/07/21 2100  vancomycin (VANCOREADY) IVPB 750 mg/150 mL        750 mg 150 mL/hr over 60 Minutes Intravenous Every 24 hours 07/06/21 2023     07/07/21 0300  ceFEPIme (MAXIPIME) 2 g in sodium chloride 0.9 % 100 mL IVPB        2 g 200 mL/hr over 30 Minutes Intravenous Every 12 hours 07/06/21 2018     07/06/21 2030   vancomycin (VANCOCIN) IVPB 1000 mg/200 mL premix        1,000 mg 200 mL/hr over 60 Minutes Intravenous  Once 07/06/21 2023 07/06/21 2147   07/06/21 1400  vancomycin (VANCOCIN) IVPB 1000 mg/200 mL premix        1,000 mg 200 mL/hr over 60 Minutes Intravenous  Once 07/06/21 1345 07/06/21 1503   07/06/21 1400  ceFEPIme (MAXIPIME) 2 g in sodium chloride 0.9 % 100 mL IVPB        2 g 200 mL/hr over 30 Minutes Intravenous  Once 07/06/21 1345 07/06/21 1532       PRN meds: sodium chloride, acetaminophen, fluticasone, HYDROmorphone (DILAUDID) injection, ondansetron (ZOFRAN) IV, oxyCODONE, pantoprazole, polyethylene glycol   Objective: Vitals:   07/08/21 0825 07/08/21 1235  BP:  122/74  Pulse: 78 85  Resp:  17  Temp:  98.2 F (36.8 C)  SpO2: 94% 99%    Intake/Output Summary (Last 24 hours) at 07/08/2021 1243 Last data filed at 07/08/2021 1012 Gross per 24 hour  Intake 477.94 ml  Output 250 ml  Net 227.94 ml   Filed Weights   07/06/21 2000 07/07/21 1938  Weight: 91.4 kg 91.5 kg   Weight change: 0.1 kg Body mass index is 31.59 kg/m.   Physical Exam: General exam: Pleasant, elderly Caucasian male.  Not in distress Skin: No rashes, lesions or ulcers. HEENT: Atraumatic, normocephalic, no obvious bleeding Lungs: Clear to auscultation bilaterally CVS: Regular rate and rhythm, no murmur GI/Abd soft, nontender, nondistended, bowel sounds CNS: Alert, awake, oriented x3 Psychiatry: Mood appropriate Extremities: No pedal edema, no calf tenderness  Data Review: I have personally reviewed the laboratory data and studies available.  Recent Labs  Lab 07/06/21 0717 07/07/21 0300 07/08/21 0445  WBC 17.1* 19.2* 27.0*  NEUTROABS 4.6 3.3 4.0  HGB 10.4* 8.7* 9.6*  HCT 32.1* 26.9* 29.3*  MCV 101.6* 103.5* 103.2*  PLT 148* 117* 125*   Recent Labs  Lab 07/06/21 0717 07/06/21 1243 07/07/21 0300 07/08/21 0445  NA 137  --  133* 134*  K 3.7  --  3.9 4.1  CL 103  --  104 102  CO2 26   --  25 25  GLUCOSE 120*  --  138* 131*  BUN 28*  --  26* 28*  CREATININE 1.42*  --  1.58* 1.40*  CALCIUM 8.4*  --  7.5* 7.9*  MG  --   --  1.9  --   PHOS  --  2.8  --   --     F/u labs ordered Unresulted Labs (From admission, onward)     Start     Ordered   07/08/21 0949  Lyme Disease Serology w/Reflex  Once,   R        07/08/21 0948   07/08/21 0500  CBC with Differential/Platelet  Daily,   STAT      07/07/21 1340   07/08/21 2979  Basic metabolic panel  Daily,   STAT      07/07/21 1340   07/06/21 1214  BCR-ABL1 FISH  Once,   STAT        07/06/21 1213   07/06/21 1214  IntelliGEN Myeloid  Once,   STAT        07/06/21 1213   07/06/21 1213  Flow cytometry panel-leukemia/lymphoma work-up  Once,   STAT        07/06/21 1212            Signed, Terrilee Croak, MD [facility] 07/08/2021

## 2021-07-08 NOTE — Consult Note (Signed)
NAME: Vincent Harper  DOB: 1936-02-01  MRN: [id number]  Date/Time: 07/08/2021 4:08 PM  REQUESTING PROVIDER: Dr.Dahal Subjective:  REASON FOR CONSULT: fever ?history from patient and daughter at bedside Vincent Harper is a 85 y.o. male with a history of HTN, HLD presents to the hospital on 07/06/21 with pain rt groin for the past few weeks worsening in the past few days and inability to walk. PT was in [place] between April 2022-July 2022. He was busy building kayak shed, doing hard work . He also was in the woods and in the water fishing. He shot squirrels .but did not handle any dead animals- he had removed a few wandering ticks  after tick checks every day. In early July he developed what was a pink eye and went [facility] and got [facility] drops. His daughter had noted that he was not walking well on his rt leg. He also thought he had fever but never checked his temp He returned to [facility] July 13-July 16 by car. [place] in [place]. Pt was seen by [facility] ophthalmology on 06/16/21 and there was no conjunctivitis mentions- dry eyes, posterior detachment of retina , cataracts were mentioned in the note  He went to see his PCP on 06/21/21 for b/l eye redness. He was also c/o fever , cough and chills, fatigue Vitals during that visit was BP 100/60, HR 68, temp 98 and sats 94%. He was noted to have discharge in both eyes and some injected conjunctiva. He was given doxycycline 10 days for the cough and gentamycin eyedrops for the eyes. He called [facility] opthalmic office on 8/4 asking for eyedrops Hi PCP gav epainful and he was unable to bear weight he came to the ED He had a temp of 102.7 in the ED ON 07/06/21 BP 101/56, sats 87% on RA,  Las revealed WBC 17.1, MCV 101.6, PLT 148. Monocyte was . Blood culture sent and he wad started on cefepime/vanco. 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 MRI of the r thip revealed Myofascial edema/inflammation around the  right intertrochanteric femur iNO drainable fluid collection. 2. Trace right hip joint effusion without convincing evidence of septic arthritis. No osteomyelitis. 3. Partial tear of the right gluteus medius tendon. MRI of lumbar spine showed degenerative changes- He was seen by ortho who did not think the rt hip had infection. He underwent bone marrow biopsy yesterday . As the WBC was increasing, and fever  persisting family requested ID consult Pt says rt groin pain better. Able to walk Has some sob No diarrhea No nausea /no vomiting Appetite No difficulty in passing urine No swallowing issues No wounds on his legs      Past Surgical History:  Procedure Laterality Date   COLONOSCOPY  11/28/2016   [facility], Per [facility] New Patient Packet.   EAR BIOPSY     and lower lip   HERNIA REPAIR     umbilical and inguinal (unsure what side) hernia   NOSE SURGERY     [place], Per [facility] New Patient Packet.   TONSILLECTOMY     Per [facility] New Patient Packet.    Social History   Socioeconomic History   Marital status: Married    Spouse name: Jun Osment   Number of children: 3   Years of education: Not on file   Highest education level: Not on file  Occupational History   Not on file  Tobacco Use   Smoking status: Never  Smokeless tobacco: Never  Vaping Use   Vaping Use: Never used  Substance and Sexual Activity   Alcohol use: Yes    Alcohol/week: 1.0 standard drink    Types: 1 Cans of beer per week    Comment: 1 Beer Every 6 months.    Drug use: No   Sexual activity: Yes  Other Topics Concern   Not on file  Social History Narrative   Tobacco use, amount per day now: None   Past tobacco use, amount per day: None   How many years did you use tobacco: None   Alcohol use (drinks per week): 1 Beer every 6 months.   Diet:   Do you drink/eat things with caffeine: Diet Pepsi   Marital status: Married                                 What year were you married? 1964   Do you live in a  house, apartment, assisted living, condo, trailer, etc.? Villa in [facility].   Is it one or more stories? 1   How many persons live in your home? 2   Do you have pets in your home?( please list) No   Highest Level of education completed: College   Current or past profession:    Do you exercise? Yes                                   Type and how often? Walk Daily.   Do you have a living will? Yes in 2020   Do you have a DNR form? No                                  If not, do you want to discuss one?   Do you have signed POA/HPOA forms? Yes in 2020                        If so, please bring to you appointment    Do you have any difficulty bathing or dressing yourself? No    Do you have difficulty preparing food or eating? No   Do you have difficulty managing your medications? No   Do you have any difficulty managing your finances? No   Do you have any difficulty affording your medications? No      Social Determinants of Radio broadcast assistant Strain: Not on file  Food Insecurity: Not on file  Transportation Needs: Not on file  Physical Activity: Not on file  Stress: Not on file  Social Connections: Not on file  Intimate Partner Violence: Not on file    Family History  Problem Relation Age of Onset   Hypertension Mother    Dementia Father    Diabetes Daughter    No Known Allergies I? Current Facility-Administered Medications  Medication Dose Route Frequency Provider Last Rate Last Admin   0.9 %  sodium chloride infusion   Intravenous PRN Terrilee Croak, MD   Stopped at 07/08/21 0334   acetaminophen (TYLENOL) tablet 650 mg  650 mg Oral Q6H PRN Irene Pap N, DO   650 mg at 07/07/21 1821   ceFEPIme (MAXIPIME) 2 g in sodium chloride 0.9 % 100 mL IVPB  2 g Intravenous  Q12H Rauer, Forde Dandy, RPH 200 mL/hr at 07/08/21 1506 2 g at 07/08/21 1506   erythromycin ophthalmic ointment 1 application  1 application Both Eyes TID Kayleen Memos, DO   1 application at 22/48/25  0037   ferrous sulfate tablet 325 mg  325 mg Oral Daily Kayleen Memos, DO   325 mg at 07/08/21 0928   fluticasone (FLONASE) 50 MCG/ACT nasal spray 1 spray  1 spray Each Nare Daily PRN Kayleen Memos, DO       HYDROmorphone (DILAUDID) injection 0.5 mg  0.5 mg Intravenous Q4H PRN Irene Pap N, DO   0.5 mg at 07/06/21 1812   ondansetron (ZOFRAN) injection 4 mg  4 mg Intravenous Q6H PRN Irene Pap N, DO       oxyCODONE (Oxy IR/ROXICODONE) immediate release tablet 5 mg  5 mg Oral Q4H PRN Irene Pap N, DO   5 mg at 07/06/21 2019   pantoprazole (PROTONIX) EC tablet 40 mg  40 mg Oral Daily PRN Irene Pap N, DO       polyethylene glycol (MIRALAX / GLYCOLAX) packet 17 g  17 g Oral Daily PRN [place] Crane, Carole N, DO       rivaroxaban (XARELTO) tablet 20 mg  20 mg Oral Q supper Dahal, Marlowe Aschoff, MD       simvastatin (ZOCOR) tablet 20 mg  20 mg Oral q1800 Irene Pap N, DO   20 mg at 07/07/21 1821   vancomycin (VANCOREADY) IVPB 750 mg/150 mL  750 mg Intravenous Q24H Rauer, Forde Dandy, RPH   Stopped at 07/07/21 2326     Abtx:  Anti-infectives (From admission, onward)    Start     Dose/Rate Route Frequency Ordered Stop   07/07/21 2100  vancomycin (VANCOREADY) IVPB 750 mg/150 mL        750 mg 150 mL/hr over 60 Minutes Intravenous Every 24 hours 07/06/21 2023     07/07/21 0300  ceFEPIme (MAXIPIME) 2 g in sodium chloride 0.9 % 100 mL IVPB        2 g 200 mL/hr over 30 Minutes Intravenous Every 12 hours 07/06/21 2018     07/06/21 2030  vancomycin (VANCOCIN) IVPB 1000 mg/200 mL premix        1,000 mg 200 mL/hr over 60 Minutes Intravenous  Once 07/06/21 2023 07/06/21 2147   07/06/21 1400  vancomycin (VANCOCIN) IVPB 1000 mg/200 mL premix        1,000 mg 200 mL/hr over 60 Minutes Intravenous  Once 07/06/21 1345 07/06/21 1503   07/06/21 1400  ceFEPIme (MAXIPIME) 2 g in sodium chloride 0.9 % 100 mL IVPB        2 g 200 mL/hr over 30 Minutes Intravenous  Once 07/06/21 1345 07/06/21 1532       REVIEW OF  SYSTEMS:  Const:  fever,  chills, minimal weight loss Eyes: as above ENT: negative coryza, negative sore throat Resp: +cough, no hemoptysis, +dyspnea Cards: negative for chest pain, palpitations, lower extremity edema GU: negative for frequency, dysuria and hematuria GI: Negative for abdominal pain, diarrhea, bleeding, constipation Skin: negative for rash and pruritus Heme: negative for easy bruising and gum/nose bleeding MS: as above Neurolo:negative for headaches, dizziness, vertigo, memory problems  Psych: negative for feelings of anxiety, depression  Endocrine: negative for thyroid, diabetes Allergy/Immunology- negative for any medication or food allergies ? Objective:  VITALS:  BP 122/74 (BP Location: Right Arm)   Pulse 85   Temp 98.2 F (36.8 C)   Resp 17  Ht _0  (1.702 m)   Wt 91.5 kg   SpO2 99%   BMI 31.59 kg/m  PHYSICAL EXAM:  General: Alert, cooperative, no distress, appears stated age.  Head: Normocephalic, without obvious abnormality, atraumatic. Eyes: Conjunctivae clear, anicteric sclerae. Pupils are equal ENT Nares normal. No drainage or sinus tenderness. Lips, mucosa, and tongue normal. No Thrush Neck: Supple, symmetrical, no adenopathy, thyroid: non tender no carotid bruit and no JVD. Back: No CVA tenderness. Lungs: b/l air entry Heart: Regular rate and rhythm, no murmur, rub or gallop. Abdomen: Soft, non-tender,not distended. Bowel sounds normal. No masses Extremities: atraumatic, no cyanosis. No edema. No clubbing Skin: No rashes or lesions. Or bruising Lymph: inguinal lymphnodes palpable b/l Neurologic: Grossly non-focal Pertinent Labs Lab Results CBC    Component Value Date/Time   WBC 27.0 (H) 07/08/2021 0445   RBC 2.84 (L) 07/08/2021 0445   HGB 9.6 (L) 07/08/2021 0445   HGB 14.9 02/19/2012 1235   HCT 29.3 (L) 07/08/2021 0445   HCT 44.2 02/19/2012 1235   PLT 125 (L) 07/08/2021 0445   PLT 135 (L) 02/19/2012 1235   MCV 103.2 (H) 07/08/2021  0445   MCV 94 02/19/2012 1235   MCH 33.8 07/08/2021 0445   MCHC 32.8 07/08/2021 0445   RDW 15.6 (H) 07/08/2021 0445   RDW 13.6 02/19/2012 1235   LYMPHSABS 1.6 07/08/2021 0445   MONOABS 21.1 (H) 07/08/2021 0445   EOSABS 0.1 07/08/2021 0445   BASOSABS 0.0 07/08/2021 0445    CMP Latest Ref Rng & Units 07/08/2021 07/07/2021 07/06/2021  Glucose 70 - 99 mg/dL 131(H) 138(H) 120(H)  BUN 8 - 23 mg/dL 28(H) 26(H) 28(H)  Creatinine 0.61 - 1.24 mg/dL 1.40(H) 1.58(H) 1.42(H)  Sodium 135 - 145 mmol/L 134(L) 133(L) 137  Potassium 3.5 - 5.1 mmol/L 4.1 3.9 3.7  Chloride 98 - 111 mmol/L 102 104 103  CO2 22 - 32 mmol/L _1 Calcium 8.9 - 10.3 mg/dL 7.9(L) 7.5(L) 8.4(L)  Total Protein 6.5 - 8.1 g/dL - 7.7 8.8(H)  Total Bilirubin 0.3 - 1.2 mg/dL - 0.9 1.0  Alkaline Phos 38 - 126 U/L - 44 52  AST 15 - 41 U/L - 14(L) 16  ALT 0 - 44 U/L - 10 12      Microbiology: Recent Results (from the past 240 hour(s))  MRSA Next Gen by PCR, Nasal     Status: None   Collection Time: 07/06/21 12:16 AM   Specimen: Nasal Mucosa; Nasal Swab  Result Value Ref Range Status   MRSA by PCR Next Gen NOT DETECTED NOT DETECTED Final    Comment: (NOTE) The GeneXpert MRSA Assay (FDA approved for NASAL specimens only), is one component of a comprehensive MRSA colonization surveillance program. It is not intended to diagnose MRSA infection nor to guide or monitor treatment for MRSA infections. Test performance is not FDA approved in patients less than 70 years old. Performed at [facility], [address] [place], [place] [address]   Blood culture (routine x 2)     Status: None (Preliminary result)   Collection Time: 07/06/21 12:26 PM   Specimen: BLOOD  Result Value Ref Range Status   Specimen Description BLOOD RIGHT ANTECUBITAL  Final   Special Requests   Final    BOTTLES DRAWN AEROBIC AND ANAEROBIC Blood Culture results may not be optimal due to an inadequate volume of blood received in culture bottles    Culture   Final    NO GROWTH 2 DAYS Performed  at [facility], [address] [place], [place] [address]    Report Status PENDING  Incomplete  Blood culture (routine x 2)     Status: None (Preliminary result)   Collection Time: 07/06/21 12:26 PM   Specimen: BLOOD  Result Value Ref Range Status   Specimen Description BLOOD RIGHT ARM  Final   Special Requests   Final    BOTTLES DRAWN AEROBIC AND ANAEROBIC Blood Culture results may not be optimal due to an inadequate volume of blood received in culture bottles   Culture   Final    NO GROWTH 2 DAYS Performed at [facility], [address], [place], [place] [address]    Report Status PENDING  Incomplete  SARS CORONAVIRUS 2 (TAT 6-24 HRS) Nasopharyngeal Nasopharyngeal Swab     Status: None   Collection Time: 07/06/21  2:00 PM   Specimen: Nasopharyngeal Swab  Result Value Ref Range Status   SARS Coronavirus 2 NEGATIVE NEGATIVE Final    Comment: (NOTE) SARS-CoV-2 target nucleic acids are NOT DETECTED.  The SARS-CoV-2 RNA is generally detectable in upper and lower respiratory specimens during the acute phase of infection. Negative results do not preclude SARS-CoV-2 infection, do not rule out co-infections with other pathogens, and should not be used as the sole basis for treatment or other patient management decisions. Negative results must be combined with clinical observations, patient history, and epidemiological information. The expected result is Negative.  Fact Sheet for Patients: SugarRoll.be  Fact Sheet for Healthcare Providers: https://www.woods-mathews.com/  This test is not yet approved or cleared by the [place] FDA and  has been authorized for detection and/or diagnosis of SARS-CoV-2 by FDA under an Emergency Use Authorization (EUA). This EUA will remain  in effect (meaning this test can be used) for the duration of the COVID-19 declaration under Se  ction 564(b)(1) of the Act, 21 U.S.C. section 360bbb-3(b)(1), unless the authorization is terminated or revoked sooner.  Performed at [facility], [place] [address], [place], [place] [address]     IMAGING RESULTS:  I have personally reviewed the films ? Impression/Recommendation ?85 yr male presenting with fever, rt groin pain, fatigue, cough , and h/o red eyes Monocytosis with blast Malignancy vs infection or both Infections that cause monocytosis includes TB, Subacute bacterial endocarditis, lyme, syphilis, rickettsial disease to name a few but 50-60% monocytosis is less likely seen in infection. He had many environmental risk factors  when he was in [place] for Tickborne illness but he had taken Doxy for 10 days recently and that would have treated TBI. 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 Lyme titers have already been done Will discuss with radiologist regarding his MRI . Discussed with his daughters and patient that infectious workup for FUO can be pursued if Bone marrow biopsy is unrevealing. Antibiotics can be stopped. Pt can be investigated as OP if needed     ? ? ___________________________________________________ Discussed with patient, requesting provider Note:  This document was prepared using Dragon voice recognition software and may include unintentional dictation errors.

## 2021-07-08 NOTE — TOC Initial Note (Signed)
Transition of Care [facility]) - Initial/Assessment Note    Patient Details  Name: Vincent Harper MRN: [id number] Date of Birth: 10/15/1936  Transition of [facility] CM/SW Contact:    Su Hilt, RN Phone Number: 07/08/2021, 11:06 AM  Clinical Narrative:       Met with the patient and his family in the room, He resides in [place] living at [facility], he stated that he has DME at home including a RW and a raised toilet, he walked very well here with PT,   He stated that if he needs to do [facility] PT he wanted to use [facility] PT, I notified Seth Bake with [facility] and requested a fax number to send the [facility] orders to, He has transportation and can afford his medications. TOC will continue to monitor for needs                Patient Goals and CMS Choice        Expected Discharge Plan and Services                                                Prior Living Arrangements/Services                       Activities of Daily Living Home Assistive Devices/Equipment: None ADL Screening (condition at time of admission) Patient's cognitive ability adequate to safely complete daily activities?: Yes Is the patient deaf or have difficulty hearing?: No Does the patient have difficulty seeing, even when wearing glasses/contacts?: No Does the patient have difficulty concentrating, remembering, or making decisions?: No Patient able to express need for assistance with ADLs?: No Does the patient have difficulty dressing or bathing?: No Independently performs ADLs?: Yes (appropriate for developmental age) Does the patient have difficulty walking or climbing stairs?: No Weakness of Legs: Left Weakness of Arms/Hands: None  Permission Sought/Granted                  Emotional Assessment              Admission diagnosis:  Monocytosis [D72.821] Hypoxia [R09.02] DVT (deep venous thrombosis) ([place]) [I82.409] Left hip pain [M25.552] Right hip pain [M25.551] History of  DVT (deep vein thrombosis) [Z86.718] Pain and swelling of left lower leg [J82.505, M79.89] Patient Active Problem List   Diagnosis Date Noted   Right hip pain 07/06/2021   Mixed hyperlipidemia 10/02/2017   Goiter, nodular 10/21/2015   Erectile dysfunction 03/11/2013   Essential hypertension 03/11/2013   Cataract 03/09/2011   PCP:  [name], NP Pharmacy:   [facility] [phone] [address], [place] AT [facility] OF [address] [place] [address] Phone: [phone] Fax: [phone]  [id number] - [place], [place] - [address] AT [facility] OF [address] [place] [address] Phone: [phone] Fax: [phone]     Social Determinants of Health ([place]) Interventions    Readmission Risk Interventions No flowsheet data found.

## 2021-07-08 NOTE — Telephone Encounter (Signed)
Daughter Jeral Fruit states that hospitalist is going to d/c charge pt. Tomorrow. She has not even got results of bone marrow bx  yet, and pt still has fever. I called Dr. Janese Banks and said that she will be at hospital tomorrow am between 7:30 to 8 am. The daughter will make sure she is there to speak to Janese Banks

## 2021-07-08 NOTE — Evaluation (Signed)
Physical Therapy Evaluation Patient Details Name: Vincent Harper MRN: [id number] DOB: 1936/02/03 Today's Date: 07/08/2021   History of Present Illness  Vincent Harper is a 85 y.o. 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   Clinical Impression  Pt admitted with above diagnosis. Pt seated in recliner upon entry with family present in room. On RA with SPO2 at 94% and HR trending in mid 80's. Agreeable to PT at this time. Able to appropriately and accurately report PLOF, home layout and DME. Per pt report pt fully indep with ADL's/IADL's with no AD and is an active individual. Family available as needed once home to assist pt if needed. With supervision pt performed all transfers and ambulatory tasks with no AD and no LOB. Intermittent need to hand touching objects/walls due to pt reports of caution secondary to being in bed for 4 days. Overall pt is ambulating > 300' with no AD able to perform head turns, change gait speed, and stop on command with no LOB and minimal sway throughout. Pt would benefit from [facility] PT services to progress balance and endurance due to desatting to 85% on RA post session. Educated on PLB with SPO2 returning to > 90% after seated rest of ~ 1 min. RN notified. Pt currently with functional limitations due to the deficits listed below (see PT Problem List). Pt will benefit from skilled PT to increase their independence and safety with mobility to allow discharge to the venue listed below.       Follow Up Recommendations Home health PT;Supervision - Intermittent    Equipment Recommendations  None recommended by PT    Recommendations for Other Services        Precautions / Restrictions Precautions Precautions: Fall Precaution Comments: Per Dr. Inocencio Homes on RLE. Restrictions Weight Bearing Restrictions: Yes RLE Weight Bearing: Weight bearing as tolerated      Mobility  Bed Mobility               General bed mobility comments: Received and returned to recliner.    Transfers Overall transfer level: Needs assistance Equipment used: None Transfers: Sit to/from Stand Sit to Stand: Supervision         General transfer comment: Use of UE's on recliner arm rails to stand.  Ambulation/Gait   Gait Distance (Feet): 350 Feet Assistive device: None Gait Pattern/deviations: Step-through pattern;Drifts right/left;Narrow base of support     General Gait Details: Cautious with walking and intermittent UE touch on objects througout for stability. Pt reports this is not baseline but being cautious d/t  being in bed for 4 days.  Stairs            Wheelchair Mobility    Modified Rankin (Stroke Patients Only)       Balance Overall balance assessment: Needs assistance;Mild deficits observed, not formally tested Sitting-balance support: No upper extremity supported;Feet supported Sitting balance-Leahy Scale: Good       Standing balance-Leahy Scale: Good Standing balance comment: No LOB or sway with standing.             High level balance activites: Head turns;Sudden stops High Level Balance Comments: Able to change speed and stop walking on command with no LOB. Minor drift R/L with head turns.  Pertinent Vitals/Pain Pain Assessment: No/denies pain    Home Living Family/patient expects to be discharged to:: Private residence Living Arrangements: Spouse/significant other Available Help at Discharge: Family;Available 24 hours/day;Available PRN/intermittently Type of Home: Independent living facility Home Access: Level entry     Home Layout: Two level Home Equipment: Environmental consultant - 2 wheels Additional  Comments: Lives in [place] with spouse.    Prior Function Level of Independence: Independent         Comments: Completed all household and community ADL's with no issues.     Hand Dominance        Extremity/Trunk Assessment   Upper Extremity Assessment Upper Extremity Assessment: Overall WFL for tasks assessed    Lower Extremity Assessment Lower Extremity Assessment: Generalized weakness    Cervical / Trunk Assessment Cervical / Trunk Assessment: Normal  Communication   Communication: No difficulties  Cognition Arousal/Alertness: Awake/alert Behavior During Therapy: WFL for tasks assessed/performed Overall Cognitive Status: Within Functional Limits for tasks assessed                                        General Comments General comments (skin integrity, edema, etc.): SPo2 down to 86% post amb on RA. With PLB SPO2 returned > 90% after 1 min seated rest.    Exercises Other Exercises Other Exercises: Education on role of acute PT, pursed lip breathing, benefits of HH PT   Assessment/Plan    PT Assessment Patient needs continued PT services  PT Problem List Decreased strength;Decreased activity tolerance;Decreased balance       PT Treatment Interventions DME instruction;Therapeutic exercise;Gait training;Balance training;Stair training;Neuromuscular re-education;Functional mobility training;Therapeutic activities;Patient/family education    PT Goals (Current goals can be found in the Care Plan section)  Acute Rehab PT Goals Patient Stated Goal: to go home PT Goal Formulation: With patient Time For Goal Achievement: 07/22/21 Potential to Achieve Goals: Good    Frequency Min 2X/week   Barriers to discharge        Co-evaluation               AM-PAC PT "6 Clicks" Mobility  Outcome Measure Help needed turning from your back to your side while in a flat bed without using bedrails?: None Help needed moving from lying on your back to  sitting on the side of a flat bed without using bedrails?: None Help needed moving to and from a bed to a chair (including a wheelchair)?: None Help needed standing up from a chair using your arms (e.g., wheelchair or bedside chair)?: A Little Help needed to walk in hospital room?: A Little Help needed climbing 3-5 steps with a railing? : A Little 6 Click Score: 21    End of Session Equipment Utilized During Treatment: Gait belt Activity Tolerance: Patient tolerated treatment well;No increased pain Patient left: in chair;with family/visitor present;with call bell/phone within reach Nurse Communication: Mobility status PT Visit Diagnosis: Unsteadiness on feet (R26.81)    Time: [id number] PT Time Calculation (min) (ACUTE ONLY): 21 min   Charges:   PT Evaluation $PT Eval Low Complexity: 1 Low PT Treatments $Neuromuscular Re-education: 8-22 mins        Cailie Bosshart M. Fairly IV, PT, DPT Physical Therapist- [facility]  07/08/2021, 10:06 AM

## 2021-07-08 NOTE — TOC Progression Note (Signed)
Transition of Care [facility]) - Progression Note    Patient Details  Name: Vincent Harper MRN: [id number] Date of Birth: 1936-08-21  Transition of Care [facility] CM/SW [place], RN Phone Number: 07/08/2021, 2:52 PM  Clinical Narrative:     The patient and his family requested me to come into the room, I go in and speak with them and listened to their concerns, They would like to have an infectious disease consult due to WBCs continuously going up and they do not understand why since the patient has been on IV ABX.  I notified the physician of the request.       Expected Discharge Plan and Services                                                 Social Determinants of Health (SDOH) Interventions    Readmission Risk Interventions No flowsheet data found.

## 2021-07-09 DIAGNOSIS — D72821 Monocytosis (symptomatic): Secondary | ICD-10-CM | POA: Diagnosis not present

## 2021-07-09 DIAGNOSIS — R0902 Hypoxemia: Secondary | ICD-10-CM

## 2021-07-09 DIAGNOSIS — R509 Fever, unspecified: Secondary | ICD-10-CM | POA: Diagnosis not present

## 2021-07-09 DIAGNOSIS — M25552 Pain in left hip: Secondary | ICD-10-CM

## 2021-07-09 DIAGNOSIS — I82409 Acute embolism and thrombosis of unspecified deep veins of unspecified lower extremity: Secondary | ICD-10-CM

## 2021-07-09 LAB — BASIC METABOLIC PANEL
Anion gap: 5 (ref 5–15)
BUN: 36 mg/dL — ABNORMAL HIGH (ref 8–23)
CO2: 28 mmol/L (ref 22–32)
Calcium: 8.4 mg/dL — ABNORMAL LOW (ref 8.9–10.3)
Chloride: 104 mmol/L (ref 98–111)
Creatinine, Ser: 1.35 mg/dL — ABNORMAL HIGH (ref 0.61–1.24)
GFR, Estimated: 52 mL/min — ABNORMAL LOW (ref >60)
Glucose, Bld: 129 mg/dL — ABNORMAL HIGH (ref 70–99)
Potassium: 3.9 mmol/L (ref 3.5–5.1)
Sodium: 137 mmol/L (ref 135–145)

## 2021-07-09 LAB — COMP PANEL: LEUKEMIA/LYMPHOMA: Immunophenotypic Profile: 1

## 2021-07-09 LAB — CBC WITH DIFFERENTIAL/PLATELET
Abs Immature Granulocytes: 0.2 10*3/uL — ABNORMAL HIGH (ref 0.00–0.07)
Basophils Absolute: 0 10*3/uL (ref 0.0–0.1)
Basophils Relative: 0 %
Eosinophils Absolute: 0.1 10*3/uL (ref 0.0–0.5)
Eosinophils Relative: 0 %
HCT: 27.5 % — ABNORMAL LOW (ref 39.0–52.0)
Hemoglobin: 9.1 g/dL — ABNORMAL LOW (ref 13.0–17.0)
Immature Granulocytes: 1 %
Lymphocytes Relative: 4 %
Lymphs Abs: 0.9 10*3/uL (ref 0.7–4.0)
MCH: 33.5 pg (ref 26.0–34.0)
MCHC: 33.1 g/dL (ref 30.0–36.0)
MCV: 101.1 fL — ABNORMAL HIGH (ref 80.0–100.0)
Monocytes Absolute: 15.8 10*3/uL — ABNORMAL HIGH (ref 0.1–1.0)
Monocytes Relative: 77 %
Neutro Abs: 3.7 10*3/uL (ref 1.7–7.7)
Neutrophils Relative %: 18 %
Platelets: 125 10*3/uL — ABNORMAL LOW (ref 150–400)
RBC: 2.72 MIL/uL — ABNORMAL LOW (ref 4.22–5.81)
RDW: 15.8 % — ABNORMAL HIGH (ref 11.5–15.5)
Smear Review: NORMAL
WBC: 20.7 10*3/uL — ABNORMAL HIGH (ref 4.0–10.5)
nRBC: 0.4 % — ABNORMAL HIGH (ref 0.0–0.2)

## 2021-07-09 LAB — SURGICAL PATHOLOGY

## 2021-07-09 LAB — LYME DISEASE SEROLOGY W/REFLEX: Lyme Total Antibody EIA: NEGATIVE

## 2021-07-09 LAB — HIV ANTIBODY (ROUTINE TESTING W REFLEX): HIV Screen 4th Generation wRfx: NONREACTIVE

## 2021-07-09 LAB — MONONUCLEOSIS SCREEN: Mono Screen: NEGATIVE

## 2021-07-09 MED ORDER — DOXYCYCLINE HYCLATE 100 MG PO TABS: 100.0000 mg | ORAL_TABLET | Freq: Two times a day (BID) | ORAL | Status: DC

## 2021-07-09 NOTE — Care Management Important Message (Signed)
Important Message  Patient Details  Name: Vincent Harper MRN: [id number] Date of Birth: 1936/10/07   Medicare Important Message Given:  Yes     Dannette Barbara 07/09/2021, 10:51 AM

## 2021-07-09 NOTE — Progress Notes (Signed)
Hematology/Oncology Consult note [facility]  Telephone:([phone] [phone]  Patient Care Team: [name], NP as PCP - General (Geriatric Medicine)   Name of the patient: Vincent Harper  [id number]  August 14, 1936   Date of visit: 07/09/2021   Interval history-patient had a T-max of 101 overnight.  Hip pain is overall improving and he is able to ambulate.  ECOG PS- 1 Pain scale- 3   Review of systems- Review of Systems  Constitutional:  Positive for malaise/fatigue. Negative for chills, fever and weight loss.  HENT:  Negative for congestion, ear discharge and nosebleeds.   Eyes:  Negative for blurred vision.  Respiratory:  Negative for cough, hemoptysis, sputum production, shortness of breath and wheezing.   Cardiovascular:  Negative for chest pain, palpitations, orthopnea and claudication.  Gastrointestinal:  Negative for abdominal pain, blood in stool, constipation, diarrhea, heartburn, melena, nausea and vomiting.  Genitourinary:  Negative for dysuria, flank pain, frequency, hematuria and urgency.  Musculoskeletal:  Negative for back pain, joint pain and myalgias.  Skin:  Negative for rash.  Neurological:  Negative for dizziness, tingling, focal weakness, seizures, weakness and headaches.  Endo/Heme/Allergies:  Does not bruise/bleed easily.  Psychiatric/Behavioral:  Negative for depression and suicidal ideas. The patient does not have insomnia.      No Known Allergies   Past Medical History:  Diagnosis Date   History of colon polyps    Hypercholesteremia    Per [facility] New Patient Packet.   Hyperlipidemia    Hypertension    Per [facility] New Patient Packet.   Post-nasal drip    Per [facility] Patient Packet.   Squamous cell carcinoma of skin 12/21/2020   Right mid helix, [facility]   Squamous cell carcinoma of skin 12/21/2020   in situ, Central lower lip, needs appt for LN2 followed by 5FU   Squamous cell carcinoma of skin 02/10/2021    right dorsum hand, [facility]      Past Surgical History:  Procedure Laterality Date   COLONOSCOPY  11/28/2016   [facility], Per [facility] New Patient Packet.   EAR BIOPSY     and lower lip   HERNIA REPAIR     umbilical and inguinal (unsure what side) hernia   NOSE SURGERY     [place], Per [facility] New Patient Packet.   TONSILLECTOMY     Per [facility] New Patient Packet.    Social History   Socioeconomic History   Marital status: Married    Spouse name: Jermaine Neuharth   Number of children: 3   Years of education: Not on file   Highest education level: Not on file  Occupational History   Not on file  Tobacco Use   Smoking status: Never   Smokeless tobacco: Never  Vaping Use   Vaping Use: Never used  Substance and Sexual Activity   Alcohol use: Yes    Alcohol/week: 1.0 standard drink    Types: 1 Cans of beer per week    Comment: 1 Beer Every 6 months.    Drug use: No   Sexual activity: Yes  Other Topics Concern   Not on file  Social History Narrative   Tobacco use, amount per day now: None   Past tobacco use, amount per day: None   How many years did you use tobacco: None   Alcohol use (drinks per week): 1 Beer every 6 months.   Diet:   Do you drink/eat things with caffeine: Diet Pepsi  Marital status: Married                                 What year were you married? 1964   Do you live in a house, apartment, assisted living, condo, trailer, etc.? Villa [place] [facility].   Is it one or more stories? 1   How many persons live in your home? 2   Do you have pets in your home?( please list) No   Highest Level of education completed: College   Current or past profession:    Do you exercise? Yes                                   Type and how often? Walk Daily.   Do you have a living will? Yes in 2020   Do you have a DNR form? No                                  If not, do you want to discuss one?   Do you have signed POA/HPOA forms? Yes in 2020                        If so, please  bring to you appointment    Do you have any difficulty bathing or dressing yourself? No    Do you have difficulty preparing food or eating? No   Do you have difficulty managing your medications? No   Do you have any difficulty managing your finances? No   Do you have any difficulty affording your medications? No      Social Determinants of Radio broadcast assistant Strain: Not on file  Food Insecurity: Not on file  Transportation Needs: Not on file  Physical Activity: Not on file  Stress: Not on file  Social Connections: Not on file  Intimate Partner Violence: Not on file    Family History  Problem Relation Age of Onset   Hypertension Mother    Dementia Father    Diabetes Daughter      Current Facility-Administered Medications:    0.9 %  sodium chloride infusion, , Intravenous, PRN, Terrilee Croak, MD, Stopped at 07/08/21 0334   acetaminophen (TYLENOL) tablet 650 mg, 650 mg, Oral, Q6H PRN, Kayleen Memos, DO, 650 mg at 07/08/21 2012   doxycycline (VIBRA-TABS) tablet 100 mg, 100 mg, Oral, Q12H, Sharen Hones, MD   ferrous sulfate tablet 325 mg, 325 mg, Oral, Daily, Hall, Carole N, DO, 325 mg at 07/09/21 0842   fluticasone (FLONASE) 50 MCG/ACT nasal spray 1 spray, 1 spray, Each Nare, Daily PRN, Irene Pap N, DO   HYDROmorphone (DILAUDID) injection 0.5 mg, 0.5 mg, Intravenous, Q4H PRN, Hall, Carole N, DO, 0.5 mg at 07/06/21 1812   ondansetron (ZOFRAN) injection 4 mg, 4 mg, Intravenous, Q6H PRN, [place] Crane, Carole N, DO   oxyCODONE (Oxy IR/ROXICODONE) immediate release tablet 5 mg, 5 mg, Oral, Q4H PRN, Hall, Carole N, DO, 5 mg at 07/06/21 2019   pantoprazole (PROTONIX) EC tablet 40 mg, 40 mg, Oral, Daily PRN, Hall, Carole N, DO   polyethylene glycol (MIRALAX / GLYCOLAX) packet 17 g, 17 g, Oral, Daily PRN, Hall, Carole N, DO   rivaroxaban (XARELTO) tablet 20 mg, 20 mg, Oral, Q  supper, Dahal, Marlowe Aschoff, MD, 20 mg at 07/08/21 1711   simvastatin (ZOCOR) tablet 20 mg, 20 mg, Oral, q1800, Kayleen Memos, DO, 20 mg at 07/08/21 1711  Physical exam:  Vitals:   07/09/21 0446 07/09/21 0726 07/09/21 1114 07/09/21 1601  BP: 115/74 106/70 106/61 117/69  Pulse: 74 72 76 85  Resp: _0 Temp: 98.5 F (36.9 C) 98.6 F (37 C) 98.7 F (37.1 C) 98.8 F (37.1 C)  TempSrc:      SpO2: 96% 95% 93% 92%  Weight:      Height:       Physical Exam Cardiovascular:     Rate and Rhythm: Normal rate and regular rhythm.     Heart sounds: Normal heart sounds.  Pulmonary:     Effort: Pulmonary effort is normal.     Breath sounds: Normal breath sounds.  Abdominal:     General: Bowel sounds are normal.     Palpations: Abdomen is soft.  Skin:    General: Skin is warm and dry.  Neurological:     Mental Status: He is alert and oriented to person, place, and time.     CMP Latest Ref Rng & Units 07/09/2021  Glucose 70 - 99 mg/dL 129(H)  BUN 8 - 23 mg/dL 36(H)  Creatinine 0.61 - 1.24 mg/dL 1.35(H)  Sodium 135 - 145 mmol/L 137  Potassium 3.5 - 5.1 mmol/L 3.9  Chloride 98 - 111 mmol/L 104  CO2 22 - 32 mmol/L 28  Calcium 8.9 - 10.3 mg/dL 8.4(L)  Total Protein 6.5 - 8.1 g/dL -  Total Bilirubin 0.3 - 1.2 mg/dL -  Alkaline Phos 38 - 126 U/L -  AST 15 - 41 U/L -  ALT 0 - 44 U/L -   CBC Latest Ref Rng & Units 07/09/2021  WBC 4.0 - 10.5 K/uL 20.7(H)  Hemoglobin 13.0 - 17.0 g/dL 9.1(L)  Hematocrit 39.0 - 52.0 % 27.5(L)  Platelets 150 - 400 K/uL 125(L)    _1 @  DG Lumbar Spine Complete  Result Date: 07/06/2021 CLINICAL DATA:  Right groin pain. EXAM: LUMBAR SPINE - COMPLETE 4+ VIEW COMPARISON:  None. FINDINGS: Mild grade 1 retrolisthesis of L2-3 is noted secondary to severe degenerative disc disease at this level. No fracture is noted. Moderate degenerative disc disease is also noted at L4-5 and L5-S1. Degenerative changes are seen involving posterior facet joints of L5-S1. IMPRESSION: Multilevel degenerative disc disease. No acute abnormality is noted. Aortic Atherosclerosis  (ICD10-I70.0). Electronically Signed   By: Marijo Conception M.D.   On: 07/06/2021 08:48   MR Lumbar Spine W Wo Contrast  Result Date: 07/06/2021 CLINICAL DATA:  Lumbar radiculopathy, cancer or infection suspected EXAM: MRI LUMBAR SPINE WITHOUT AND WITH CONTRAST TECHNIQUE: Multiplanar and multiecho pulse sequences of the lumbar spine were obtained without and with intravenous contrast. CONTRAST:  26m GADAVIST GADOBUTROL 1 MMOL/ML IV SOLN COMPARISON:  Lumbar radiographs from the same day. FINDINGS: Segmentation: Standard segmentation is assumed with the inferior-most fully formed intervertebral disc labeled L5-S1. Alignment: Grade 1 retrolisthesis of L2 on L3 and grade 1 anterolisthesis of L5 on S1. Slight retrolisthesis of L3 on L4 and L4 on L5. Dextrocurvature of the upper lumbar spine with levocurvature of the lower lumbar spine. Vertebrae: Degenerative/discogenic endplate signal changes about the left L2-L3 and L5-S1 and right L4-L5 disc spaces, where there is disc space narrowing. Otherwise, no focal marrow edema to suggest acute fracture or discitis/osteomyelitis. Mild disc edema and enhancement at the sites  of degenerative disc space narrowing described above. Mildly heterogeneous marrow without suspicious bone lesion. Conus medullaris and cauda equina: Conus extends to the L1-L2 level. Conus appears normal. No abnormal enhancement of the conus or cauda equina. Fatty filum terminalis. Paraspinal and other soft tissues: Bilateral renal cysts. No appreciable paraspinal edema. Disc levels: T11-T12: Only imaged sagittally without evidence of significant canal or foraminal stenosis. T12-L1: No significant disc protrusion, foraminal stenosis, or canal stenosis. L1-L2: Mild disc bulging and facet arthropathy without significant canal or foraminal stenosis. L2-L3: Left eccentric degenerative disc disease. Grade 1 retrolisthesis of L2 on L3. Disc bulging, including biforaminal disc protrusions. Mild to moderate right  greater than left foraminal stenosis. Mild bilateral subarticular recess narrowing without significant canal stenosis. L3-L4: Slight retrolisthesis of L3 on L4. Disc bulging, including biforaminal disc protrusions. Moderate bilateral facet hypertrophy. Resulting mild to moderate right greater than left foraminal stenosis. Mild canal stenosis and right greater than left subarticular recess stenosis L4-L5: Right eccentric degenerative disc disease. Slight retrolisthesis of L4 on L5. Disc bulging, including biforaminal disc protrusions, and ligamentum flavum thickening. Resulting moderate bilateral foraminal stenosis. Mild canal stenosis with right greater than left subarticular recess stenosis. L5-S1: Grade 1 anterolisthesis of L5 on S1. Broad disc bulge with moderate bilateral facet hypertrophy. Resulting moderate left and mild right foraminal stenosis. IMPRESSION: 1. Moderate foraminal stenosis bilaterally at L4-L5 and on the left at L5-S1. Mild-to-moderate right greater than left foraminal stenosis at L2-L3 and L3-L4. 2. Mild canal stenosis at L3-L4 and L4-L5 with right greater than left subarticular recess narrowing. 3. Degenerative disc disease greatest at L2-L3, L4-L5 and L5-S1, detailed above. Electronically Signed   By: Margaretha Sheffield MD   On: 07/06/2021 12:23   MR HIP RIGHT W WO CONTRAST  Result Date: 07/06/2021 CLINICAL DATA:  Right groin pain. EXAM: MRI OF THE RIGHT HIP WITHOUT AND WITH CONTRAST TECHNIQUE: Multiplanar, multisequence MR imaging was performed both before and after administration of intravenous contrast. CONTRAST:  52m GADAVIST GADOBUTROL 1 MMOL/ML IV SOLN COMPARISON:  Right hip x-rays from same day. FINDINGS: Bones: There is no evidence of acute fracture, dislocation or avascular necrosis. No focal bone lesion. The visualized sacroiliac joints and symphysis pubis appear normal. Articular cartilage and labrum Articular cartilage: No focal chondral defect or subchondral signal  abnormality identified. Labrum: Grossly intact, although evaluation is limited due to lack of intra-articular fluid. No paralabral abnormality. Joint or bursal effusion Joint effusion: Trace right hip joint effusion. Bursae: No focal periarticular fluid collection. Muscles and tendons Muscles and tendons: Partial tear of the right gluteus medius tendon (series 8, image 12). The bilateral hamstring and iliopsoas tendons appear normal. Mild edema and enhancement in the proximal vastus lateralis muscle. More prominent myofascial edema and enhancement around the lesser trochanter. Other findings Miscellaneous: Severe colonic diverticulosis. Bilateral renal cysts. IMPRESSION: 1. Myofascial edema/inflammation around the right intertrochanteric femur is nonspecific, but concerning for infection given leukocytosis, especially if there is no history of trauma. No drainable fluid collection. 2. Trace right hip joint effusion without convincing evidence of septic arthritis. No osteomyelitis. 3. Partial tear of the right gluteus medius tendon. Electronically Signed   By: WTitus DubinM.D.   On: 07/06/2021 12:39   UK[place]Venous Img Lower Unilateral Left (DVT)  Result Date: 07/07/2021 CLINICAL DATA:  History of prior DVT, pain and swelling left lower leg EXAM: LEFT LOWER EXTREMITY VENOUS DOPPLER ULTRASOUND TECHNIQUE: Gray-scale sonography with graded compression, as well as color Doppler and duplex ultrasound were performed to  evaluate the lower extremity deep venous systems from the level of the common femoral vein and including the common femoral, femoral, profunda femoral, popliteal and calf veins including the posterior tibial, peroneal and gastrocnemius veins when visible. The superficial great saphenous vein was also interrogated. Spectral Doppler was utilized to evaluate flow at rest and with distal augmentation maneuvers in the common femoral, femoral and popliteal veins. COMPARISON:  None. FINDINGS: Contralateral  Common Femoral Vein: Respiratory phasicity is normal and symmetric with the symptomatic side. No evidence of thrombus. Normal compressibility. Common Femoral Vein: No evidence of thrombus. Normal compressibility, respiratory phasicity and response to augmentation. Saphenofemoral Junction: No evidence of thrombus. Normal compressibility and flow on color Doppler imaging. Profunda Femoral Vein: No evidence of thrombus. Normal compressibility and flow on color Doppler imaging. Femoral Vein: No evidence of thrombus. Normal compressibility, respiratory phasicity and response to augmentation. Popliteal Vein: No evidence of thrombus. Normal compressibility, respiratory phasicity and response to augmentation. Calf Veins: No evidence of thrombus. Normal compressibility and flow on color Doppler imaging. IMPRESSION: No evidence of deep venous thrombosis. Electronically Signed   By: Jerilynn Mages.  Shick M.D.   On: 07/07/2021 11:38   US Venous Img Lower Unilateral Right  Result Date: 07/06/2021 CLINICAL DATA:  Right lower extremity pain 3-4 weeks, history of positive DVT in 2016 EXAM: RIGHT LOWER EXTREMITY VENOUS DOPPLER ULTRASOUND TECHNIQUE: Gray-scale sonography with graded compression, as well as color Doppler and duplex ultrasound were performed to evaluate the lower extremity deep venous systems from the level of the common femoral vein and including the common femoral, femoral, profunda femoral, popliteal and calf veins including the posterior tibial, peroneal and gastrocnemius veins when visible. The superficial great saphenous vein was also interrogated. Spectral Doppler was utilized to evaluate flow at rest and with distal augmentation maneuvers in the common femoral, femoral and popliteal veins. COMPARISON:  March 2016 FINDINGS: Contralateral Common Femoral Vein: Respiratory phasicity is normal and symmetric with the symptomatic side. No evidence of thrombus. Normal compressibility. Common Femoral Vein: No evidence of  thrombus. Normal compressibility, respiratory phasicity and response to augmentation. Saphenofemoral Junction: No evidence of thrombus. Normal compressibility and flow on color Doppler imaging. Profunda Femoral Vein: No evidence of thrombus. Normal compressibility and flow on color Doppler imaging. Femoral Vein: No evidence of thrombus. Normal compressibility, respiratory phasicity and response to augmentation. Popliteal Vein: No evidence of thrombus. Normal compressibility, respiratory phasicity and response to augmentation. Calf Veins: No evidence of thrombus. Normal compressibility and flow on color Doppler imaging. Other Findings:  None. IMPRESSION: No findings of acute or chronic right lower extremity deep venous thrombosis. Electronically Signed   By: Albin Felling MD   On: 07/06/2021 08:46   DG Chest Port 1 View  Result Date: 07/06/2021 CLINICAL DATA:  Right groin pain. EXAM: PORTABLE CHEST 1 VIEW COMPARISON:  CT chest and chest x-ray dated February 19, 2012. FINDINGS: The heart size and mediastinal contours are within normal limits. Low lung volumes are present, causing crowding of the pulmonary vasculature. Mild bibasilar atelectasis. No focal consolidation, pleural effusion, or pneumothorax. No acute osseous abnormality. IMPRESSION: 1. No active disease. Electronically Signed   By: Titus Dubin M.D.   On: 07/06/2021 14:54   CT BONE MARROW BIOPSY & ASPIRATION  Result Date: 07/07/2021 INDICATION: 85 year old with monocytosis and peripheral blood smear is concerning for myeloid neoplasm. EXAM: CT GUIDED BONE MARROW ASPIRATES AND BIOPSY Physician: Stephan Minister. Anselm Pancoast, MD MEDICATIONS: None. ANESTHESIA/SEDATION: Fentanyl 50 mcg IV; Versed 1.0 mg IV Moderate Sedation Time:  12 minutes The patient was continuously monitored during the procedure by the interventional radiology nurse under my direct supervision. COMPLICATIONS: None immediate. PROCEDURE: The procedure was explained to the patient. The risks and  benefits of the procedure were discussed and the patient's questions were addressed. Informed consent was obtained from the patient. The patient was placed prone on CT table. Images of the pelvis were obtained. The right side of back was prepped and draped in sterile fashion. The skin and right posterior ilium were anesthetized with 1% lidocaine. 11 gauge bone needle was directed into the right ilium with CT guidance. Two aspirates and one core biopsy were obtained. Bandage placed over the puncture site. FINDINGS: Biopsy needle directed in the posterior right ilium. IMPRESSION: CT guided bone marrow aspiration and core biopsy. Electronically Signed   By: Markus Daft M.D.   On: 07/07/2021 11:01   DG Hip Unilat W or Wo Pelvis 2-3 Views Right  Result Date: 07/06/2021 CLINICAL DATA:  Right hip and groin pain EXAM: DG HIP (WITH OR WITHOUT PELVIS) 2-3V RIGHT COMPARISON:  None. FINDINGS: There is no evidence of hip fracture or dislocation. Mild hip joint space narrowing. Partially visualized lower lumbar spondylosis. IMPRESSION: Mild degenerative changes of the right hip.  No acute findings. Electronically Signed   By: Davina Poke D.O.   On: 07/06/2021 08:46     Assessment and plan- Patient is a 85 y.o. male admitted for right hip pain found to have leukocytosis/monocytosis concerning for myeloid neoplasm  I met with the patient and his wife and daughter at the bedside.  Bone marrow biopsy results are still not back.  Flow cytometry results were not available at the time of my visit today but later on came back with absolute aberrant monocytosis and 1% aberrant myeloblasts suggestive of CMML.  We still have to wait for bone marrow biopsy to confirm diagnosis which should be hopefully available by early next week.  Peripheral flow cytometry SH by itself is not confirmatory for CMML.   Patient's leukocytosis shows mainly differential monocytosis and therefore likely secondary to underlying myeloid neoplasm  rather than infection.  Also it is possible that his low-grade fever is secondary to CMML and not an infectious cause.  ID is also on board and so far blood cultures have been negative.  Antibiotics have been stopped.  Patient does not have to remain inpatient from a hematology standpoint if it can be ascertained to a reasonable degree that his fever is noninfectious cause.  Visit Diagnosis 1. Hypoxia   2. Monocytosis   3. DVT (deep venous thrombosis) (HCC)   4. Pain and swelling of left lower leg   5. History of DVT (deep vein thrombosis)      Dr. Randa Evens, MD, MPH [facility] [phone] 07/09/2021 4:59 PM

## 2021-07-09 NOTE — Progress Notes (Signed)
Patient ID: Vincent Harper, male   DOB: [birth year], 85 y.o.   MRN: [id number]  Subjective: The patient notes that his right hip is feeling much better.  He has been able to ambulate in the hallways and has walked around the nurses station without any assistive devices.  He denies any new orthopedic issues at this time.   Objective: Vital signs in last 24 hours: Temp:  [97.8 F (36.6 C)-101 F (38.3 C)] 98.6 F (37 C) (08/12 0726) Pulse Rate:  [72-87] 72 (08/12 0726) Resp:  [16-18] 16 (08/12 0726) BP: (106-122)/(70-74) 106/70 (08/12 0726) SpO2:  [94 %-99 %] 95 % (08/12 0726)  Intake/Output from previous day: 08/11 0701 - 08/12 0700 In: 697.3 [P.O.:600; IV Piggyback:97.3] Out: -  Intake/Output this shift: No intake/output data recorded.  Recent Labs    07/07/21 0300 07/08/21 0445 07/09/21 0502  HGB 8.7* 9.6* 9.1*   Recent Labs    07/08/21 0445 07/09/21 0502  WBC 27.0* 20.7*  RBC 2.84* 2.72*  HCT 29.3* 27.5*  PLT 125* 125*   Recent Labs    07/08/21 0445 07/09/21 0502  NA 134* 137  K 4.1 3.9  CL 102 104  CO2 25 28  BUN 28* 36*  CREATININE 1.40* 1.35*  GLUCOSE 131* 129*  CALCIUM 7.9* 8.4*   No results for input(s): LABPT, INR in the last 72 hours.  Physical Exam: Repeat examination is limited to the right hip and lower extremity.  Skin inspection around the right hip is unremarkable.  No swelling, erythema, ecchymosis, abrasions, or other skin abnormalities are identified.  The patient has no tenderness to palpation around the right hip or thigh.  He is able to actively flex and extend his hip and abduct his hip without any discomfort.  There is no pain with passive hip range of motion.  He is neurovascularly intact to the right lower extremity and foot.  Assessment: Acute right hip pain, probably secondary to bleeding into the hip abductor musculature, improving symptomatically.  Plan: The treatment options have been reviewed with the patient and his family, who is  at the bedside.  Given that Dr. Delaine Lame has a low suspicion for an infectious etiology of his symptoms and in fact has recommended discontinuing the antibiotics, and in light of his clinical improvement regarding his right hip and lower extremity, I feel that no further orthopedic intervention is necessary at this time.  He may progress in his activities as symptoms permit, and continue the heme-onc work-up that presently is ongoing.    Meanwhile, I will sign off.  He may follow-up in my office as necessary.  If you have further questions or orthopedic concerns, please reconsult me.  Thank you for allowing me to participate in the care of this most delightful patient.   Vincent Harper 07/09/2021, 7:57 AM

## 2021-07-09 NOTE — Progress Notes (Addendum)
PROGRESS NOTE    Vincent Harper  OIZ:[id number] DOB: [birth year] DOA: 07/06/2021 PCP: [name], NP   CC: Leg pain. Brief Narrative:  Vincent Harper is a 85 y.o. 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 Patient presented to ED on 8/9 with progressively worsening right hip pain for 2 weeks associated with intermittent fever of 101-102 at home.  MRI of his right hip showed myofascial edema/inflammation around the right intertrochanteric femur which is nonspecific, per radiology report, but concerning for infection given leukocytosis, especially if there is no history of trauma. He also has elevated monocytes, with peripheral smear suspect reactive versus chronic leukemia.   Assessment & Plan:   Active Problems:   Right hip pain  #1.  Right hip pain.   Partial tear of right gluteus medius tendon. Fever of unknown origin. Monocytosis. Blood cultures so far is negative.  MRSA culture negative.  HIV negative, Lyme serology negative. Patient has been evaluated by ID and orthopedics.  Not a concern for bacterial infection, antibiotics were discontinued. Patient also had a bone marrow biopsy, results still pending. Patient probably can go home tomorrow while waiting for bone marrow biopsy. I also will check for mononucleosis serology. Patient had ben treated with doxycycline for 10 days previously, no tick born disease.    #2.  Acute kidney injury. Creatinine mildly elevated.  IV fluids has completed.  3.  History of DVT. Patient is still on Xarelto.  #4.  Leg edema. Negative DVT.  Patient also has mild crackles in the left lower lobe, he was initially required 2 L oxygen.  I am concerned for chronic congestive heart failure, echocardiogram is pending, check a  BNP.  #5.  Mild anemia. Mild thrombocytopenia. B12 level borderline, will check homocystine level  DVT prophylaxis: Xarelto Code Status: full Family Communication: Daughter and wife at bedside Disposition Plan:    Status is: Inpatient  Remains inpatient appropriate because:IV treatments appropriate due to intensity of illness or inability to take PO and Inpatient level of care appropriate due to severity of illness  Dispo:  Patient From: Home  Planned Disposition: Home  Medically stable for discharge: No          I/O last 3 completed shifts: In: 1055.2 [P.O.:600; I.V.:7.9; IV Piggyback:447.3] Out: 250 [Urine:250] No intake/output data recorded.     Consultants:  Ortho, ID  Procedures: bone marrow biopsy  Antimicrobials: None   Subjective: Patient did not have a fever today.  No chills. He denies any abdominal pain or nausea vomiting. No short of breath or cough.  He is off oxygen. No dysuria hematuria.  No skin rash.  Objective: Vitals:   07/09/21 0446 07/09/21 0726 07/09/21 1114 07/09/21 1601  BP: 115/74 106/70 106/61 117/69  Pulse: 74 72 76 85  Resp: 18 16 18 16   Temp: 98.5 F (36.9 C) 98.6 F (37 C) 98.7 F (37.1 C) 98.8 F (37.1 C)  TempSrc:      SpO2: 96% 95% 93% 92%  Weight:      Height:        Intake/Output Summary (Last 24 hours) at 07/09/2021 1634 Last data filed at 07/08/2021 1900 Gross per 24 hour  Intake 240 ml  Output --  Net 240 ml   Filed Weights   07/06/21 2000 07/07/21 1938  Weight: 91.4 kg 91.5 kg  Examination:  General exam: Appears calm and comfortable  Respiratory system: A few crackles in the left base. Respiratory effort normal. Cardiovascular system: S1 & S2 heard, RRR. No JVD, murmurs, rubs, gallops or clicks.  Gastrointestinal system: Abdomen is nondistended, soft and nontender. No organomegaly or masses felt. Normal bowel sounds heard. Central nervous system: Alert and oriented. No focal neurological  deficits. Extremities: 1+ bilateral leg edema Skin: No rashes, lesions or ulcers Psychiatry: Judgement and insight appear normal. Mood & affect appropriate.     Data Reviewed: I have personally reviewed following labs and imaging studies  CBC: Recent Labs  Lab 07/06/21 0717 07/07/21 0300 07/08/21 0445 07/09/21 0502  WBC 17.1* 19.2* 27.0* 20.7*  NEUTROABS 4.6 3.3 4.0 3.7  HGB 10.4* 8.7* 9.6* 9.1*  HCT 32.1* 26.9* 29.3* 27.5*  MCV 101.6* 103.5* 103.2* 101.1*  PLT 148* 117* 125* 185*   Basic Metabolic Panel: Recent Labs  Lab 07/06/21 0717 07/06/21 1243 07/07/21 0300 07/08/21 0445 07/09/21 0502  NA 137  --  133* 134* 137  K 3.7  --  3.9 4.1 3.9  CL 103  --  104 102 104  CO2 26  --  25 25 28   GLUCOSE 120*  --  138* 131* 129*  BUN 28*  --  26* 28* 36*  CREATININE 1.42*  --  1.58* 1.40* 1.35*  CALCIUM 8.4*  --  7.5* 7.9* 8.4*  MG  --   --  1.9  --   --   PHOS  --  2.8  --   --   --    GFR: Estimated Creatinine Clearance: 44.7 mL/min (A) (by C-G formula based on SCr of 1.35 mg/dL (H)). Liver Function Tests: Recent Labs  Lab 07/06/21 0717 07/07/21 0300  AST 16 14*  ALT 12 10  ALKPHOS 52 44  BILITOT 1.0 0.9  PROT 8.8* 7.7  ALBUMIN 2.7* 2.3*   No results for input(s): LIPASE, AMYLASE in the last 168 hours. No results for input(s): AMMONIA in the last 168 hours. Coagulation Profile: Recent Labs  Lab 07/06/21 0717  INR 2.4*   Cardiac Enzymes: No results for input(s): CKTOTAL, CKMB, CKMBINDEX, TROPONINI in the last 168 hours. BNP (last 3 results) No results for input(s): PROBNP in the last 8760 hours. HbA1C: No results for input(s): HGBA1C in the last 72 hours. CBG: No results for input(s): GLUCAP in the last 168 hours. Lipid Profile: No results for input(s): CHOL, HDL, LDLCALC, TRIG, CHOLHDL, LDLDIRECT in the last 72 hours. Thyroid Function Tests: No results for input(s): TSH, T4TOTAL, FREET4, T3FREE, THYROIDAB in the last 72 hours. Anemia Panel: No  results for input(s): VITAMINB12, FOLATE, FERRITIN, TIBC, IRON, RETICCTPCT in the last 72 hours. Sepsis Labs: Recent Labs  Lab 07/06/21 0744 07/06/21 1226 07/07/21 0300  PROCALCITON  --   --  0.70  LATICACIDVEN 1.0 1.2  --     Recent Results (from the past 240 hour(s))  MRSA Next Gen by PCR, Nasal     Status: None   Collection Time: 07/06/21 12:16 AM   Specimen: Nasal Mucosa; Nasal Swab  Result Value Ref Range Status   MRSA by PCR Next Gen NOT DETECTED NOT DETECTED Final    Comment: (NOTE) The GeneXpert MRSA Assay (FDA approved for NASAL specimens only), is one component of a comprehensive MRSA colonization surveillance program. It is not intended to diagnose MRSA infection nor to guide or monitor treatment for MRSA infections. Test performance is not FDA approved in patients less than 2  years old. Performed at [facility], [address] [place], [place] [address]   Blood culture (routine x 2)     Status: None (Preliminary result)   Collection Time: 07/06/21 12:26 PM   Specimen: BLOOD  Result Value Ref Range Status   Specimen Description BLOOD RIGHT ANTECUBITAL  Final   Special Requests   Final    BOTTLES DRAWN AEROBIC AND ANAEROBIC Blood Culture results may not be optimal due to an inadequate volume of blood received in culture bottles   Culture   Final    NO GROWTH 3 DAYS Performed at [facility], [address], [place], [place] [address]    Report Status PENDING  Incomplete  Blood culture (routine x 2)     Status: None (Preliminary result)   Collection Time: 07/06/21 12:26 PM   Specimen: BLOOD  Result Value Ref Range Status   Specimen Description BLOOD RIGHT ARM  Final   Special Requests   Final    BOTTLES DRAWN AEROBIC AND ANAEROBIC Blood Culture results may not be optimal due to an inadequate volume of blood received in culture bottles   Culture   Final    NO GROWTH 3 DAYS Performed at [facility], [address],  [place], [place] [address]    Report Status PENDING  Incomplete  SARS CORONAVIRUS 2 (TAT 6-24 HRS) Nasopharyngeal Nasopharyngeal Swab     Status: None   Collection Time: 07/06/21  2:00 PM   Specimen: Nasopharyngeal Swab  Result Value Ref Range Status   SARS Coronavirus 2 NEGATIVE NEGATIVE Final    Comment: (NOTE) SARS-CoV-2 target nucleic acids are NOT DETECTED.  The SARS-CoV-2 RNA is generally detectable in upper and lower respiratory specimens during the acute phase of infection. Negative results do not preclude SARS-CoV-2 infection, do not rule out co-infections with other pathogens, and should not be used as the sole basis for treatment or other patient management decisions. Negative results must be combined with clinical observations, patient history, and epidemiological information. The expected result is Negative.  Fact Sheet for Patients: SugarRoll.be  Fact Sheet for Healthcare Providers: https://www.woods-mathews.com/  This test is not yet approved or cleared by the [place] FDA and  has been authorized for detection and/or diagnosis of SARS-CoV-2 by FDA under an Emergency Use Authorization (EUA). This EUA will remain  in effect (meaning this test can be used) for the duration of the COVID-19 declaration under Se ction 564(b)(1) of the Act, 21 U.S.C. section 360bbb-3(b)(1), unless the authorization is terminated or revoked sooner.  Performed at [facility], [place] [address], [place], [place] [address]          Radiology Studies: No results found.      Scheduled Meds:  ferrous sulfate  325 mg Oral Daily   rivaroxaban  20 mg Oral Q supper   simvastatin  20 mg Oral q1800   Continuous Infusions:  sodium chloride Stopped (07/08/21 0334)     LOS: 3 days    Time spent: 32 minutes, more than 50% time in direct patient care.    Sharen Hones, MD [facility]   To contact the attending provider  between 7A-7P or the covering provider during after hours 7P-7A, please log into the web site [email or web address] and access using universal [place] password for that web site. If you do not have the password, please call the hospital operator.  07/09/2021, 4:34 PM

## 2021-07-09 NOTE — Progress Notes (Signed)
   Date of Admission:  07/06/2021    Family at bedside  Subjective: Patient is feeling better than when he came into the hospital.  Has some cough and shortness of breath.  Echo was ordered for today but it has not been done. No fever today. Right groin pain is better   Medications:   ferrous sulfate  325 mg Oral Daily   rivaroxaban  20 mg Oral Q supper   simvastatin  20 mg Oral q1800    Objective: Vital signs in last 24 hours: Temp:  [97.8 F (36.6 C)-101 F (38.3 C)] 98.7 F (37.1 C) (08/12 1114) Pulse Rate:  [72-87] 76 (08/12 1114) Resp:  [16-18] 18 (08/12 1114) BP: (106-121)/(61-74) 106/61 (08/12 1114) SpO2:  [93 %-96 %] 93 % (08/12 1114)  PHYSICAL EXAM:  General: Alert, cooperative, no distress, appears stated age.  Head: Normocephalic, without obvious abnormality, atraumatic. Eyes: Conjunctivae clear, anicteric sclerae. Pupils are equal ENT Nares normal. No drainage or sinus tenderness. Lips, mucosa, and tongue normal. No Thrush Neck: Supple, symmetrical, no adenopathy, thyroid: non tender no carotid bruit and no JVD. Back: No CVA tenderness. Lungs: Bilateral air entry.  Few basal crypts.  Patient Heart: Regular rate and rhythm, no murmur, rub or gallop. Abdomen: Sitting in chair did not examine. Extremities: atraumatic, no cyanosis. No edema. No clubbing Skin: No rashes or lesions. Or bruising Lymph: Cervical, supraclavicular normal. Neurologic: Grossly non-focal  Lab Results Recent Labs    07/08/21 0445 07/09/21 0502  WBC 27.0* 20.7*  HGB 9.6* 9.1*  HCT 29.3* 27.5*  NA 134* 137  K 4.1 3.9  CL 102 104  CO2 25 28  BUN 28* 36*  CREATININE 1.40* 1.35*   Liver Panel Recent Labs    07/07/21 0300  PROT 7.7  ALBUMIN 2.3*  AST 14*  ALT 10  ALKPHOS 89  BILITOT 0.9    Microbiology: Blood culture from 07/06/2021 no growth 07/06/2021 SARS-CoV-2 negative A 1222 HIV screen nonreactive 07/09/2021 Monospot screen nonreactive 07/08/2021 Lyme antibody  negative 07/09/2021 RPR pending  Studies/Results: MRI of the right hip and the lumbar spine reviewed with musculoskeletal radiologist.  No evidence of infection.  Impression/recommendation 85 year old male presenting with fever right groin pain fatigue cough and history of conjunctivitis as per patient. Has monocytosis with blast cells in the peripheral smear.  The differential diagnosis is malignancy versus infection or both. Infections that cause monocytosis include TB, subacute bacterial endocarditis, Lyme syphilis, rickettsial disease to name a few but this high level of monocytosis is not seen with infection. Also had taken a 10-day course of doxycycline and that would have treated all the tickborne illness. Lyme test is negative RPR pending Yesterday I had reviewed the slides with Dr. 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  Bone marrow biopsy has been done and results are pending. Discontinued vancomycin and cefepime last night. AKI but slight improvement in creatinine today.  Shortness of breath.  2D echo is pending.  Discussed with patient and family that infectious work-up for FUO can be pursued if bone marrow biopsy is unrevealing. Discussed with Dr. Roosevelt Locks ID will follow him peripherally this weekend.  Call if needed.

## 2021-07-10 ENCOUNTER — Inpatient Hospital Stay
Admit: 2021-07-10 | Discharge: 2021-07-10 | Disposition: A | Discharge status: Home / Self Care | Payer: Medicare Other | Attending: Infectious Diseases | Admitting: Infectious Diseases

## 2021-07-10 ENCOUNTER — Other Ambulatory Visit: Payer: Self-pay | Admitting: Oncology

## 2021-07-10 DIAGNOSIS — D4989 Neoplasm of unspecified behavior of other specified sites: Secondary | ICD-10-CM

## 2021-07-10 LAB — BASIC METABOLIC PANEL
Anion gap: 7 (ref 5–15)
BUN: 38 mg/dL — ABNORMAL HIGH (ref 8–23)
CO2: 26 mmol/L (ref 22–32)
Calcium: 7.9 mg/dL — ABNORMAL LOW (ref 8.9–10.3)
Chloride: 102 mmol/L (ref 98–111)
Creatinine, Ser: 1.26 mg/dL — ABNORMAL HIGH (ref 0.61–1.24)
GFR, Estimated: 57 mL/min — ABNORMAL LOW (ref >60)
Glucose, Bld: 116 mg/dL — ABNORMAL HIGH (ref 70–99)
Potassium: 3.5 mmol/L (ref 3.5–5.1)
Sodium: 135 mmol/L (ref 135–145)

## 2021-07-10 LAB — CBC WITH DIFFERENTIAL/PLATELET
Abs Immature Granulocytes: 0.13 10*3/uL — ABNORMAL HIGH (ref 0.00–0.07)
Basophils Absolute: 0 10*3/uL (ref 0.0–0.1)
Basophils Relative: 0 %
Eosinophils Absolute: 0.1 10*3/uL (ref 0.0–0.5)
Eosinophils Relative: 1 %
HCT: 25.2 % — ABNORMAL LOW (ref 39.0–52.0)
Hemoglobin: 8.3 g/dL — ABNORMAL LOW (ref 13.0–17.0)
Immature Granulocytes: 1 %
Lymphocytes Relative: 7 %
Lymphs Abs: 0.9 10*3/uL (ref 0.7–4.0)
MCH: 32.5 pg (ref 26.0–34.0)
MCHC: 32.9 g/dL (ref 30.0–36.0)
MCV: 98.8 fL (ref 80.0–100.0)
Monocytes Absolute: 9.8 10*3/uL — ABNORMAL HIGH (ref 0.1–1.0)
Monocytes Relative: 70 %
Neutro Abs: 2.9 10*3/uL (ref 1.7–7.7)
Neutrophils Relative %: 21 %
Platelets: 114 10*3/uL — ABNORMAL LOW (ref 150–400)
RBC: 2.55 MIL/uL — ABNORMAL LOW (ref 4.22–5.81)
RDW: 15.7 % — ABNORMAL HIGH (ref 11.5–15.5)
WBC: 13.8 10*3/uL — ABNORMAL HIGH (ref 4.0–10.5)
nRBC: 0.6 % — ABNORMAL HIGH (ref 0.0–0.2)

## 2021-07-10 LAB — ECHOCARDIOGRAM COMPLETE
AR max vel: 1.92 cm2
AV Area VTI: 1.8 cm2
AV Area mean vel: 1.78 cm2
AV Mean grad: 12 mmHg
AV Peak grad: 20.4 mmHg
Ao pk vel: 2.26 m/s
Height: 67 in
S' Lateral: 3.34 cm
Weight: 3227.53 oz

## 2021-07-10 LAB — GLUCOSE, CAPILLARY: Glucose-Capillary: 113 mg/dL — ABNORMAL HIGH (ref 70–99)

## 2021-07-10 LAB — BRAIN NATRIURETIC PEPTIDE: B Natriuretic Peptide: 89.4 pg/mL (ref 0.0–100.0)

## 2021-07-10 LAB — MAGNESIUM: Magnesium: 2.2 mg/dL (ref 1.7–2.4)

## 2021-07-10 LAB — RPR: RPR Ser Ql: NONREACTIVE

## 2021-07-10 MED ORDER — PERFLUTREN LIPID MICROSPHERE
1.0000 mL | INTRAVENOUS | Status: AC | PRN
  Administered 2021-07-10: 3 mL via INTRAVENOUS
  Filled 2021-07-10: qty 10

## 2021-07-10 NOTE — Progress Notes (Signed)
.  et

## 2021-07-10 NOTE — Progress Notes (Signed)
Vincent Harper to be D/C'd Home per MD order.  Discussed with the patient and all questions fully answered.  VSS, Skin clean, dry and intact without evidence of skin break down, no evidence of skin tears noted. IV catheter discontinued intact. Site without signs and symptoms of complications. Dressing and pressure applied.  An After Visit Summary was printed and given to the patient. Patient understands he will complete the 24 hour urine collection at home. Family shown where lab is located to return urine samples from 24 hour collection tomorrow morning 07/11/21.  D/c education completed with patient/family including follow up instructions, medication list, d/c activities limitations if indicated, with other d/c instructions as indicated by MD - patient able to verbalize understanding, all questions fully answered.   Patient instructed to return to ED, call 911, or call MD for any changes in condition.   Patient escorted via [place], and D/C home via private auto.   [name] 07/10/2021 3:29 PM

## 2021-07-10 NOTE — Discharge Summary (Signed)
Physician Discharge Summary  Patient ID: Vincent Harper MRN: [id number] DOB/AGE: November 27, 1936 85 y.o.  Admit date: 07/06/2021 Discharge date: 07/10/2021  Admission Diagnoses:  Discharge Diagnoses:  Active Problems:   Right hip pain   Monocytosis   Discharged Condition: good  Hospital Course:  Vincent Harper is a 85 y.o. 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 Patient presented to ED on 8/9 with progressively worsening right hip pain for 2 weeks associated with intermittent fever of 101-102 at home.  MRI of his right hip showed myofascial edema/inflammation around the right intertrochanteric femur which is nonspecific, per radiology report, but concerning for infection given leukocytosis, especially if there is no history of trauma. He also has elevated monocytes, with peripheral smear suspect reactive versus chronic leukemia.   #1.  Right hip pain.   Partial tear of right gluteus medius tendon. Fever of unknown origin. Monocytosis. Blood cultures so far is negative.  MRSA culture negative.  HIV negative, Lyme serology negative.  Mononucleosis serology negative.  Patient also being treated with doxycycline for 10 days before admission. Patient has been evaluated by ID and orthopedics.  Not a concern for bacterial infection, antibiotics were discontinued. Patient also had a bone marrow biopsy, results still pending. Patient has 24-hour urine collection for urine electrophoresis, he will be discharged after urine collection tonight. She will be followed with Dr. Judithann Graves for bone marrow biopsy results.  He will be followed with his family doctor in the near future     #2.  Acute kidney injury. Creatinine mildly elevated.  IV fluids has completed.  3.  History of DVT. Patient  is still on Xarelto.   #4.  Leg edema. Negative DVT.   Patient also had echocardiogram, reviewed normal ejection fraction,  BNP normal.  #5.  Mild anemia. Mild thrombocytopenia. B12 level borderline, homocystine is pending  #6 essential hypertension. Blood pressure is not elevated in the hospital, discontinue all blood pressure medicines.  Consults: ID and orthopedic surgery  Significant Diagnostic Studies:   LEFT LOWER EXTREMITY VENOUS DOPPLER ULTRASOUND   TECHNIQUE: Gray-scale sonography with graded compression, as well as color Doppler and duplex ultrasound were performed to evaluate the lower extremity deep venous systems from the level of the common femoral vein and including the common femoral, femoral, profunda femoral, popliteal and calf veins including the posterior tibial, peroneal and gastrocnemius veins when visible. The superficial great saphenous vein was also interrogated. Spectral Doppler was utilized to evaluate flow at rest and with distal augmentation maneuvers in the common femoral, femoral and popliteal veins.   COMPARISON:  None.   FINDINGS: Contralateral Common Femoral Vein: Respiratory phasicity is normal and symmetric with the symptomatic side. No evidence of thrombus. Normal compressibility.   Common Femoral Vein: No evidence of thrombus. Normal compressibility, respiratory phasicity and response to augmentation.   Saphenofemoral Junction: No evidence of thrombus. Normal compressibility and flow on color Doppler imaging.   Profunda Femoral Vein: No evidence of thrombus. Normal compressibility and flow on color Doppler imaging.   Femoral Vein: No evidence of thrombus. Normal compressibility, respiratory phasicity and response to augmentation.   Popliteal Vein: No evidence of thrombus. Normal compressibility, respiratory phasicity and response to augmentation.   Calf Veins: No evidence of thrombus. Normal compressibility and flow on color  Doppler imaging.   IMPRESSION: No evidence of deep venous thrombosis.  Electronically Signed   By: Jerilynn Mages.  Shick M.D.   On: 07/07/2021 11:38  MRI LUMBAR SPINE WITHOUT AND WITH CONTRAST   TECHNIQUE: Multiplanar and multiecho pulse sequences of the lumbar spine were obtained without and with intravenous contrast.   CONTRAST:  31mL GADAVIST GADOBUTROL 1 MMOL/ML IV SOLN   COMPARISON:  Lumbar radiographs from the same day.   FINDINGS: Segmentation: Standard segmentation is assumed with the inferior-most fully formed intervertebral disc labeled L5-S1.   Alignment: Grade 1 retrolisthesis of L2 on L3 and grade 1 anterolisthesis of L5 on S1. Slight retrolisthesis of L3 on L4 and L4 on L5. Dextrocurvature of the upper lumbar spine with levocurvature of the lower lumbar spine.   Vertebrae: Degenerative/discogenic endplate signal changes about the left L2-L3 and L5-S1 and right L4-L5 disc spaces, where there is disc space narrowing. Otherwise, no focal marrow edema to suggest acute fracture or discitis/osteomyelitis. Mild disc edema and enhancement at the sites of degenerative disc space narrowing described above. Mildly heterogeneous marrow without suspicious bone lesion.   Conus medullaris and cauda equina: Conus extends to the L1-L2 level. Conus appears normal. No abnormal enhancement of the conus or cauda equina. Fatty filum terminalis.   Paraspinal and other soft tissues: Bilateral renal cysts. No appreciable paraspinal edema.   Disc levels:   T11-T12: Only imaged sagittally without evidence of significant canal or foraminal stenosis.   T12-L1: No significant disc protrusion, foraminal stenosis, or canal stenosis.   L1-L2: Mild disc bulging and facet arthropathy without significant canal or foraminal stenosis.   L2-L3: Left eccentric degenerative disc disease. Grade 1 retrolisthesis of L2 on L3. Disc bulging, including biforaminal disc protrusions. Mild to moderate  right greater than left foraminal stenosis. Mild bilateral subarticular recess narrowing without significant canal stenosis.   L3-L4: Slight retrolisthesis of L3 on L4. Disc bulging, including biforaminal disc protrusions. Moderate bilateral facet hypertrophy. Resulting mild to moderate right greater than left foraminal stenosis. Mild canal stenosis and right greater than left subarticular recess stenosis   L4-L5: Right eccentric degenerative disc disease. Slight retrolisthesis of L4 on L5. Disc bulging, including biforaminal disc protrusions, and ligamentum flavum thickening. Resulting moderate bilateral foraminal stenosis. Mild canal stenosis with right greater than left subarticular recess stenosis.   L5-S1: Grade 1 anterolisthesis of L5 on S1. Broad disc bulge with moderate bilateral facet hypertrophy. Resulting moderate left and mild right foraminal stenosis.   IMPRESSION: 1. Moderate foraminal stenosis bilaterally at L4-L5 and on the left at L5-S1. Mild-to-moderate right greater than left foraminal stenosis at L2-L3 and L3-L4. 2. Mild canal stenosis at L3-L4 and L4-L5 with right greater than left subarticular recess narrowing. 3. Degenerative disc disease greatest at L2-L3, L4-L5 and L5-S1, detailed above.     Electronically Signed   By: Margaretha Sheffield MD   On: 07/06/2021 12:23  MRI OF THE RIGHT HIP WITHOUT AND WITH CONTRAST   TECHNIQUE: Multiplanar, multisequence MR imaging was performed both before and after administration of intravenous contrast.   CONTRAST:  97mL GADAVIST GADOBUTROL 1 MMOL/ML IV SOLN   COMPARISON:  Right hip x-rays from same day.   FINDINGS: Bones: There is no evidence of acute fracture, dislocation or avascular necrosis. No focal bone lesion. The visualized sacroiliac joints and symphysis pubis appear normal.   Articular cartilage and labrum   Articular cartilage: No focal chondral defect or subchondral signal abnormality  identified.   Labrum: Grossly intact, although evaluation is limited due to lack of intra-articular fluid. No paralabral abnormality.   Joint  or bursal effusion   Joint effusion: Trace right hip joint effusion.   Bursae: No focal periarticular fluid collection.   Muscles and tendons   Muscles and tendons: Partial tear of the right gluteus medius tendon (series 8, image 12). The bilateral hamstring and iliopsoas tendons appear normal. Mild edema and enhancement in the proximal vastus lateralis muscle. More prominent myofascial edema and enhancement around the lesser trochanter.   Other findings   Miscellaneous: Severe colonic diverticulosis. Bilateral renal cysts.   IMPRESSION: 1. Myofascial edema/inflammation around the right intertrochanteric femur is nonspecific, but concerning for infection given leukocytosis, especially if there is no history of trauma. No drainable fluid collection. 2. Trace right hip joint effusion without convincing evidence of septic arthritis. No osteomyelitis. 3. Partial tear of the right gluteus medius tendon.     Electronically Signed   By: Titus Dubin M.D.   On: 07/06/2021 12:39   RIGHT LOWER EXTREMITY VENOUS DOPPLER ULTRASOUND   TECHNIQUE: Gray-scale sonography with graded compression, as well as color Doppler and duplex ultrasound were performed to evaluate the lower extremity deep venous systems from the level of the common femoral vein and including the common femoral, femoral, profunda femoral, popliteal and calf veins including the posterior tibial, peroneal and gastrocnemius veins when visible. The superficial great saphenous vein was also interrogated. Spectral Doppler was utilized to evaluate flow at rest and with distal augmentation maneuvers in the common femoral, femoral and popliteal veins.   COMPARISON:  March 2016   FINDINGS: Contralateral Common Femoral Vein: Respiratory phasicity is normal and symmetric with the  symptomatic side. No evidence of thrombus. Normal compressibility.   Common Femoral Vein: No evidence of thrombus. Normal compressibility, respiratory phasicity and response to augmentation.   Saphenofemoral Junction: No evidence of thrombus. Normal compressibility and flow on color Doppler imaging.   Profunda Femoral Vein: No evidence of thrombus. Normal compressibility and flow on color Doppler imaging.   Femoral Vein: No evidence of thrombus. Normal compressibility, respiratory phasicity and response to augmentation.   Popliteal Vein: No evidence of thrombus. Normal compressibility, respiratory phasicity and response to augmentation.   Calf Veins: No evidence of thrombus. Normal compressibility and flow on color Doppler imaging.   Other Findings:  None.   IMPRESSION: No findings of acute or chronic right lower extremity deep venous thrombosis.     Electronically Signed   By: Albin Felling MD   On: 07/06/2021 08:46  Treatments: Antibiotics  Discharge Exam: Blood pressure (!) 100/51, pulse (!) 56, temperature 98.2 F (36.8 C), resp. rate 15, height _0  (1.702 m), weight 91.5 kg, SpO2 94 %. General appearance: alert and cooperative Resp: clear to auscultation bilaterally Cardio: regular rate and rhythm, S1, S2 normal, no murmur, click, rub or gallop GI: soft, non-tender; bowel sounds normal; no masses,  no organomegaly Extremities: 1+ leg edema  Disposition: Discharge disposition: 01-Home or Self Care       Discharge Instructions     Diet - low sodium heart healthy   Complete by: As directed    Increase activity slowly   Complete by: As directed       Allergies as of 07/10/2021   No Known Allergies      Medication List     STOP taking these medications    enalapril 20 MG tablet Commonly known as: VASOTEC   fluorouracil 5 % cream Commonly known as: EFUDEX   hydrochlorothiazide 25 MG tablet Commonly known as: HYDRODIURIL       TAKE  these medications    acetaminophen 500 MG tablet Commonly known as: TYLENOL Take 500-1,000 mg by mouth every 6 (six) hours as needed for mild pain.   erythromycin ophthalmic ointment Place 1 application into both eyes 3 (three) times daily. Instill ~1 cm ribbon into both eyes three times daily for 1 week   fluticasone 50 MCG/ACT nasal spray Commonly known as: FLONASE Place 1 spray into both nostrils daily as needed for allergies or rhinitis.   FUNGI-NAIL TOE & FOOT EX Apply 1 application topically as needed (toenail fungus).   Iron (Ferrous Sulfate) 325 (65 Fe) MG Tabs Take 325 mg by mouth daily.   pantoprazole 40 MG tablet Commonly known as: PROTONIX TAKE 1 TABLET(40 MG) BY MOUTH DAILY What changed: See the new instructions.   sildenafil 100 MG tablet Commonly known as: VIAGRA Take 1 tablet (100 mg total) by mouth daily as needed for erectile dysfunction.   simvastatin 20 MG tablet Commonly known as: ZOCOR TAKE 1 TABLET(20 MG) BY MOUTH AT BEDTIME   Xarelto 20 MG Tabs tablet Generic drug: rivaroxaban TAKE 1 TABLET(20 MG) BY MOUTH AT BEDTIME        Follow-up Information     [name], NP Follow up in 1 month(s).   Specialty: Geriatric Medicine Contact information: [address] [place] [address] [phone]         Sindy Guadeloupe, MD Follow up in 1 week(s).   Specialty: Oncology Contact information: [place] [address] [phone]                32 minutes Signed: Sharen Hones 07/10/2021, 10:23 AM

## 2021-07-11 LAB — CULTURE, BLOOD (ROUTINE X 2)
Culture: NO GROWTH
Culture: NO GROWTH

## 2021-07-11 LAB — BETA 2 MICROGLOBULIN, SERUM: Beta-2 Microglobulin: 5.1 mg/L — ABNORMAL HIGH (ref 0.6–2.4)

## 2021-07-12 ENCOUNTER — Other Ambulatory Visit: Payer: Self-pay

## 2021-07-12 ENCOUNTER — Other Ambulatory Visit: Payer: Self-pay | Admitting: Infectious Diseases

## 2021-07-12 ENCOUNTER — Other Ambulatory Visit: Payer: Self-pay | Admitting: *Deleted

## 2021-07-12 ENCOUNTER — Telehealth: Payer: Self-pay | Admitting: *Deleted

## 2021-07-12 ENCOUNTER — Inpatient Hospital Stay: Payer: Medicare Other

## 2021-07-12 ENCOUNTER — Inpatient Hospital Stay (HOSPITAL_BASED_OUTPATIENT_CLINIC_OR_DEPARTMENT_OTHER): Payer: Medicare Other | Admitting: Nurse Practitioner

## 2021-07-12 ENCOUNTER — Encounter: Payer: Self-pay | Admitting: Nurse Practitioner

## 2021-07-12 ENCOUNTER — Inpatient Hospital Stay: Payer: Medicare Other | Attending: Nurse Practitioner

## 2021-07-12 VITALS — BP 151/69 | HR 84 | Temp 101.2°F | Resp 17 | Wt 200.7 lb

## 2021-07-12 DIAGNOSIS — R0902 Hypoxemia: Secondary | ICD-10-CM | POA: Insufficient documentation

## 2021-07-12 DIAGNOSIS — R509 Fever, unspecified: Secondary | ICD-10-CM

## 2021-07-12 DIAGNOSIS — D4989 Neoplasm of unspecified behavior of other specified sites: Secondary | ICD-10-CM

## 2021-07-12 DIAGNOSIS — D72821 Monocytosis (symptomatic): Secondary | ICD-10-CM | POA: Insufficient documentation

## 2021-07-12 LAB — CBC WITH DIFFERENTIAL/PLATELET
Abs Immature Granulocytes: 0.64 10*3/uL — ABNORMAL HIGH (ref 0.00–0.07)
Basophils Absolute: 0 10*3/uL (ref 0.0–0.1)
Basophils Relative: 0 %
Eosinophils Absolute: 0.2 10*3/uL (ref 0.0–0.5)
Eosinophils Relative: 1 %
HCT: 29.4 % — ABNORMAL LOW (ref 39.0–52.0)
Hemoglobin: 9.6 g/dL — ABNORMAL LOW (ref 13.0–17.0)
Immature Granulocytes: 3 %
Lymphocytes Relative: 6 %
Lymphs Abs: 1.1 10*3/uL (ref 0.7–4.0)
MCH: 33.1 pg (ref 26.0–34.0)
MCHC: 32.7 g/dL (ref 30.0–36.0)
MCV: 101.4 fL — ABNORMAL HIGH (ref 80.0–100.0)
Monocytes Absolute: 12.8 10*3/uL — ABNORMAL HIGH (ref 0.1–1.0)
Monocytes Relative: 68 %
Neutro Abs: 4.2 10*3/uL (ref 1.7–7.7)
Neutrophils Relative %: 22 %
Platelets: 130 10*3/uL — ABNORMAL LOW (ref 150–400)
RBC: 2.9 MIL/uL — ABNORMAL LOW (ref 4.22–5.81)
RDW: 15.9 % — ABNORMAL HIGH (ref 11.5–15.5)
Smear Review: NORMAL
WBC: 18.9 10*3/uL — ABNORMAL HIGH (ref 4.0–10.5)
nRBC: 0.5 % — ABNORMAL HIGH (ref 0.0–0.2)

## 2021-07-12 LAB — COMPREHENSIVE METABOLIC PANEL
ALT: 47 U/L — ABNORMAL HIGH (ref 0–44)
AST: 40 U/L (ref 15–41)
Albumin: 2.4 g/dL — ABNORMAL LOW (ref 3.5–5.0)
Alkaline Phosphatase: 78 U/L (ref 38–126)
Anion gap: 11 (ref 5–15)
BUN: 39 mg/dL — ABNORMAL HIGH (ref 8–23)
CO2: 24 mmol/L (ref 22–32)
Calcium: 8 mg/dL — ABNORMAL LOW (ref 8.9–10.3)
Chloride: 100 mmol/L (ref 98–111)
Creatinine, Ser: 1.39 mg/dL — ABNORMAL HIGH (ref 0.61–1.24)
GFR, Estimated: 50 mL/min — ABNORMAL LOW (ref >60)
Glucose, Bld: 118 mg/dL — ABNORMAL HIGH (ref 70–99)
Potassium: 3.4 mmol/L — ABNORMAL LOW (ref 3.5–5.1)
Sodium: 135 mmol/L (ref 135–145)
Total Bilirubin: 1.1 mg/dL (ref 0.3–1.2)
Total Protein: 9 g/dL — ABNORMAL HIGH (ref 6.5–8.1)

## 2021-07-12 LAB — BCR-ABL1 FISH
Cells Analyzed: 200
Cells Counted: 200

## 2021-07-12 LAB — KAPPA/LAMBDA LIGHT CHAINS
Kappa free light chain: 23.5 mg/L — ABNORMAL HIGH (ref 3.3–19.4)
Kappa, lambda light chain ratio: 0.02 — ABNORMAL LOW (ref 0.26–1.65)
Lambda free light chains: 1105.4 mg/L — ABNORMAL HIGH (ref 5.7–26.3)

## 2021-07-12 LAB — SAMPLE TO BLOOD BANK

## 2021-07-12 LAB — HOMOCYSTEINE: Homocysteine: 13.4 umol/L (ref 0.0–21.3)

## 2021-07-12 LAB — PROCALCITONIN: Procalcitonin: 1.51 ng/mL

## 2021-07-12 MED ORDER — ACETAMINOPHEN 325 MG PO TABS
650.0000 mg | ORAL_TABLET | Freq: Once | ORAL | Status: AC
  Administered 2021-07-12: 650 mg via ORAL

## 2021-07-12 NOTE — Telephone Encounter (Signed)
Transition Care Management Follow-up Telephone Call Date of discharge and from where: 07/10/2021  How have you been since you were released from the hospital? Better going for a Pet Scan today due to dx of Blood cancer Any questions or concerns? Yes Daughter is wanting [name] to Call today regarding patient's Dx of Blood Cancer.   Items Reviewed: Did the pt receive and understand the discharge instructions provided? Yes  Medications obtained and verified? Yes  Other? No  Any new allergies since your discharge? No  Dietary orders reviewed? Yes Do you have support at home? Yes   Home Care and Equipment/Supplies: Were home health services ordered? yes If so, what is the name of the agency?   Has the agency set up a time to come to the patient's home?  Were any new equipment or medical supplies ordered?  No What is the name of the medical supply agency?  Were you able to get the supplies/equipment? not applicable Do you have any questions related to the use of the equipment or supplies? No  Functional Questionnaire: (I = Independent and D = Dependent) ADLs: I with Assistance  Bathing/Dressing- I  Meal Prep- D  Eating- I  Maintaining continence- I  Transferring/Ambulation- I with assistance  Managing Meds- I  Follow up appointments reviewed:  PCP Hospital f/u appt confirmed? Yes  Scheduled to see [name] on 07/13/2021 @ 9 at [facility]. [facility] f/u appt confirmed? No  Scheduled for Pet Scan today.  Are transportation arrangements needed? No  If their condition worsens, is the pt aware to call PCP or go to the Emergency Dept.? Yes Was the patient provided with contact information for the PCP's office or ED? Yes Was to pt encouraged to call back with questions or concerns? Yes

## 2021-07-12 NOTE — Progress Notes (Signed)
[facility] [place]  [phone] [phone]  Patient Care Team: [name], NP as PCP - General (Geriatric Medicine)   Name of the patient: Vincent Harper  [id number]  August 10, 1936   Date of visit: 07/12/21  Diagnosis- Leukocytosis/monocytosis  Chief complaint/ Reason for visit- fever and Hypoxia  Heme/Onc history:  Oncology History   No history exists.    Interval history- Patient is 85 year old male who was recently admitted to hospital for right hip pain and found to have leukocytosis/monocytosis concerning for myeloid neoplasm. He is currently awaiting PET scan scheduled for 07/15/21. He's had low grade fevers and had workup in hospital that was negative. Antibiotics were stopped. Has noticed increased shortness of breath at rest. He required oxygen while in the hospital but this was weaned and he has not needed oxygen at home. T max 101.2. Takes tylenol intermittently. Took ibuprofen, 1 tablet last night which helped. Oxygen saturation has been monitored at home and ranging from 80-91%. Feels tired, weak. Appetite is down. No chest pain. No cough or hemoptysis. No nausea, vomiting, constipation, or diarrhea. No urinary complaints. No dizziness or falls.   ECOG FS:3 - Symptomatic, >50% confined to bed  Review of systems- Review of Systems  Constitutional:  Positive for chills, fever, malaise/fatigue and weight loss.  HENT:  Negative for hearing loss, nosebleeds, sore throat and tinnitus.   Eyes:  Negative for blurred vision and double vision.  Respiratory:  Positive for cough and shortness of breath. Negative for hemoptysis and wheezing.   Cardiovascular:  Negative for chest pain, palpitations and leg swelling.  Gastrointestinal:  Negative for abdominal pain, blood in stool, constipation, diarrhea, melena, nausea and vomiting.  Genitourinary:  Negative for dysuria and urgency.  Musculoskeletal:  Negative for back  pain, falls, joint pain and myalgias.  Skin:  Negative for itching and rash.  Neurological:  Positive for weakness. Negative for dizziness, tingling, sensory change, loss of consciousness and headaches.  Endo/Heme/Allergies:  Negative for environmental allergies. Does not bruise/bleed easily.  Psychiatric/Behavioral:  Positive for depression. The patient is not nervous/anxious and does not have insomnia.     No Known Allergies  Past Medical History:  Diagnosis Date   History of colon polyps    Hypercholesteremia    Per [place] New Patient Packet.   Hyperlipidemia    Hypertension    Per [facility] New Patient Packet.   Post-nasal drip    Per [facility] Patient Packet.   Squamous cell carcinoma of skin 12/21/2020   Right mid helix, [facility]   Squamous cell carcinoma of skin 12/21/2020   in situ, Central lower lip, needs appt for LN2 followed by 5FU   Squamous cell carcinoma of skin 02/10/2021   right dorsum hand, [facility]     Past Surgical History:  Procedure Laterality Date   COLONOSCOPY  11/28/2016   [facility], Per [facility] New Patient Packet.   EAR BIOPSY     and lower lip   HERNIA REPAIR     umbilical and inguinal (unsure what side) hernia   NOSE SURGERY     [place], Per [facility] New Patient Packet.   TONSILLECTOMY     Per [facility] New Patient Packet.    Social History   Socioeconomic History   Marital status: Married    Spouse name: Samit Sylve   Number of children: 3   Years of education: Not on file   Highest education level: Not on file  Occupational History  Not on file  Tobacco Use   Smoking status: Never   Smokeless tobacco: Never  Vaping Use   Vaping Use: Never used  Substance and Sexual Activity   Alcohol use: Yes    Alcohol/week: 1.0 standard drink    Types: 1 Cans of beer per week    Comment: 1 Beer Every 6 months.    Drug use: No   Sexual activity: Yes  Other Topics Concern   Not on file  Social History Narrative   Tobacco use, amount per day now: None   Past tobacco use,  amount per day: None   How many years did you use tobacco: None   Alcohol use (drinks per week): 1 Beer every 6 months.   Diet:   Do you drink/eat things with caffeine: Diet Pepsi   Marital status: Married                                 What year were you married? 1964   Do you live in a house, apartment, assisted living, condo, trailer, etc.? Villa in [facility].   Is it one or more stories? 1   How many persons live in your home? 2   Do you have pets in your home?( please list) No   Highest Level of education completed: College   Current or past profession:    Do you exercise? Yes                                   Type and how often? Walk Daily.   Do you have a living will? Yes in 2020   Do you have a DNR form? No                                  If not, do you want to discuss one?   Do you have signed POA/HPOA forms? Yes in 2020                        If so, please bring to you appointment    Do you have any difficulty bathing or dressing yourself? No    Do you have difficulty preparing food or eating? No   Do you have difficulty managing your medications? No   Do you have any difficulty managing your finances? No   Do you have any difficulty affording your medications? No      Social Determinants of Radio broadcast assistant Strain: Not on file  Food Insecurity: Not on file  Transportation Needs: Not on file  Physical Activity: Not on file  Stress: Not on file  Social Connections: Not on file  Intimate Partner Violence: Not on file    Family History  Problem Relation Age of Onset   Hypertension Mother    Dementia Father    Diabetes Daughter      Current Outpatient Medications:    acetaminophen (TYLENOL) 500 MG tablet, Take 500-1,000 mg by mouth every 6 (six) hours as needed for mild pain., Disp: , Rfl:    erythromycin ophthalmic ointment, Place 1 application into both eyes 3 (three) times daily. Instill ~1 cm ribbon into both eyes three times daily for 1  week, Disp: 3.5 g, Rfl: 0  fluticasone (FLONASE) 50 MCG/ACT nasal spray, Place 1 spray into both nostrils daily as needed for allergies or rhinitis., Disp: 11.1 mL, Rfl: 3   Iron, Ferrous Sulfate, 325 (65 Fe) MG TABS, Take 325 mg by mouth daily., Disp: 30 tablet, Rfl: 55   sildenafil (VIAGRA) 100 MG tablet, Take 1 tablet (100 mg total) by mouth daily as needed for erectile dysfunction., Disp: 10 tablet, Rfl: 1   simvastatin (ZOCOR) 20 MG tablet, TAKE 1 TABLET(20 MG) BY MOUTH AT BEDTIME, Disp: 90 tablet, Rfl: 1   Undecylenic Ac-Zn Undecylenate (FUNGI-NAIL TOE & FOOT EX), Apply 1 application topically as needed (toenail fungus)., Disp: , Rfl:    XARELTO 20 MG TABS tablet, TAKE 1 TABLET(20 MG) BY MOUTH AT BEDTIME, Disp: 90 tablet, Rfl: 1   pantoprazole (PROTONIX) 40 MG tablet, TAKE 1 TABLET(40 MG) BY MOUTH DAILY (Patient not taking: Reported on 07/12/2021), Disp: 90 tablet, Rfl: 1  Physical exam:  Vitals:   07/12/21 1127  BP: (!) 151/69  Pulse: 84  Resp: 17  Temp: (!) 101.2 F (38.4 C)  SpO2: (!) 83%  Weight: 200 lb 11.2 oz (91 kg)   Physical Exam Constitutional:      General: He is not in acute distress.    Appearance: He is well-developed. He is ill-appearing.  HENT:     Head: Normocephalic and atraumatic.     Mouth/Throat:     Pharynx: No oropharyngeal exudate.  Eyes:     General: No scleral icterus.    Conjunctiva/sclera: Conjunctivae normal.  Cardiovascular:     Rate and Rhythm: Normal rate and regular rhythm.     Heart sounds: Normal heart sounds.  Pulmonary:     Effort: Pulmonary effort is normal. No respiratory distress.     Breath sounds: Normal breath sounds. No wheezing.     Comments: Diminished bilaterally Abdominal:     General: Bowel sounds are normal. There is no distension.     Palpations: Abdomen is soft.     Tenderness: There is no abdominal tenderness. There is no guarding.  Musculoskeletal:        General: Normal range of motion.     Cervical back: Normal  range of motion and neck supple.     Right lower leg: No edema.     Left lower leg: No edema.  Skin:    General: Skin is warm and dry.     Coloration: Skin is pale. Skin is not jaundiced.  Neurological:     Mental Status: He is alert and oriented to person, place, and time.     Motor: Weakness (generalized) present.  Psychiatric:        Behavior: Behavior normal.     CMP Latest Ref Rng & Units 07/12/2021  Glucose 70 - 99 mg/dL 118(H)  BUN 8 - 23 mg/dL 39(H)  Creatinine 0.61 - 1.24 mg/dL 1.39(H)  Sodium 135 - 145 mmol/L 135  Potassium 3.5 - 5.1 mmol/L 3.4(L)  Chloride 98 - 111 mmol/L 100  CO2 22 - 32 mmol/L 24  Calcium 8.9 - 10.3 mg/dL 8.0(L)  Total Protein 6.5 - 8.1 g/dL 9.0(H)  Total Bilirubin 0.3 - 1.2 mg/dL 1.1  Alkaline Phos 38 - 126 U/L 78  AST 15 - 41 U/L 40  ALT 0 - 44 U/L 47(H)   CBC Latest Ref Rng & Units 07/12/2021  WBC 4.0 - 10.5 K/uL 18.9(H)  Hemoglobin 13.0 - 17.0 g/dL 9.6(L)  Hematocrit 39.0 - 52.0 % 29.4(L)  Platelets 150 - 400  K/uL 130(L)    No images are attached to the encounter.  DG Lumbar Spine Complete  Result Date: 07/06/2021 CLINICAL DATA:  Right groin pain. EXAM: LUMBAR SPINE - COMPLETE 4+ VIEW COMPARISON:  None. FINDINGS: Mild grade 1 retrolisthesis of L2-3 is noted secondary to severe degenerative disc disease at this level. No fracture is noted. Moderate degenerative disc disease is also noted at L4-5 and L5-S1. Degenerative changes are seen involving posterior facet joints of L5-S1. IMPRESSION: Multilevel degenerative disc disease. No acute abnormality is noted. Aortic Atherosclerosis (ICD10-I70.0). Electronically Signed   By: Marijo Conception M.D.   On: 07/06/2021 08:48   MR Lumbar Spine W Wo Contrast  Result Date: 07/06/2021 CLINICAL DATA:  Lumbar radiculopathy, cancer or infection suspected EXAM: MRI LUMBAR SPINE WITHOUT AND WITH CONTRAST TECHNIQUE: Multiplanar and multiecho pulse sequences of the lumbar spine were obtained without and with  intravenous contrast. CONTRAST:  45m GADAVIST GADOBUTROL 1 MMOL/ML IV SOLN COMPARISON:  Lumbar radiographs from the same day. FINDINGS: Segmentation: Standard segmentation is assumed with the inferior-most fully formed intervertebral disc labeled L5-S1. Alignment: Grade 1 retrolisthesis of L2 on L3 and grade 1 anterolisthesis of L5 on S1. Slight retrolisthesis of L3 on L4 and L4 on L5. Dextrocurvature of the upper lumbar spine with levocurvature of the lower lumbar spine. Vertebrae: Degenerative/discogenic endplate signal changes about the left L2-L3 and L5-S1 and right L4-L5 disc spaces, where there is disc space narrowing. Otherwise, no focal marrow edema to suggest acute fracture or discitis/osteomyelitis. Mild disc edema and enhancement at the sites of degenerative disc space narrowing described above. Mildly heterogeneous marrow without suspicious bone lesion. Conus medullaris and cauda equina: Conus extends to the L1-L2 level. Conus appears normal. No abnormal enhancement of the conus or cauda equina. Fatty filum terminalis. Paraspinal and other soft tissues: Bilateral renal cysts. No appreciable paraspinal edema. Disc levels: T11-T12: Only imaged sagittally without evidence of significant canal or foraminal stenosis. T12-L1: No significant disc protrusion, foraminal stenosis, or canal stenosis. L1-L2: Mild disc bulging and facet arthropathy without significant canal or foraminal stenosis. L2-L3: Left eccentric degenerative disc disease. Grade 1 retrolisthesis of L2 on L3. Disc bulging, including biforaminal disc protrusions. Mild to moderate right greater than left foraminal stenosis. Mild bilateral subarticular recess narrowing without significant canal stenosis. L3-L4: Slight retrolisthesis of L3 on L4. Disc bulging, including biforaminal disc protrusions. Moderate bilateral facet hypertrophy. Resulting mild to moderate right greater than left foraminal stenosis. Mild canal stenosis and right greater than  left subarticular recess stenosis L4-L5: Right eccentric degenerative disc disease. Slight retrolisthesis of L4 on L5. Disc bulging, including biforaminal disc protrusions, and ligamentum flavum thickening. Resulting moderate bilateral foraminal stenosis. Mild canal stenosis with right greater than left subarticular recess stenosis. L5-S1: Grade 1 anterolisthesis of L5 on S1. Broad disc bulge with moderate bilateral facet hypertrophy. Resulting moderate left and mild right foraminal stenosis. IMPRESSION: 1. Moderate foraminal stenosis bilaterally at L4-L5 and on the left at L5-S1. Mild-to-moderate right greater than left foraminal stenosis at L2-L3 and L3-L4. 2. Mild canal stenosis at L3-L4 and L4-L5 with right greater than left subarticular recess narrowing. 3. Degenerative disc disease greatest at L2-L3, L4-L5 and L5-S1, detailed above. Electronically Signed   By: FMargaretha SheffieldMD   On: 07/06/2021 12:23   MR HIP RIGHT W WO CONTRAST  Result Date: 07/06/2021 CLINICAL DATA:  Right groin pain. EXAM: MRI OF THE RIGHT HIP WITHOUT AND WITH CONTRAST TECHNIQUE: Multiplanar, multisequence MR imaging was performed both before and after  administration of intravenous contrast. CONTRAST:  57m GADAVIST GADOBUTROL 1 MMOL/ML IV SOLN COMPARISON:  Right hip x-rays from same day. FINDINGS: Bones: There is no evidence of acute fracture, dislocation or avascular necrosis. No focal bone lesion. The visualized sacroiliac joints and symphysis pubis appear normal. Articular cartilage and labrum Articular cartilage: No focal chondral defect or subchondral signal abnormality identified. Labrum: Grossly intact, although evaluation is limited due to lack of intra-articular fluid. No paralabral abnormality. Joint or bursal effusion Joint effusion: Trace right hip joint effusion. Bursae: No focal periarticular fluid collection. Muscles and tendons Muscles and tendons: Partial tear of the right gluteus medius tendon (series 8, image 12).  The bilateral hamstring and iliopsoas tendons appear normal. Mild edema and enhancement in the proximal vastus lateralis muscle. More prominent myofascial edema and enhancement around the lesser trochanter. Other findings Miscellaneous: Severe colonic diverticulosis. Bilateral renal cysts. IMPRESSION: 1. Myofascial edema/inflammation around the right intertrochanteric femur is nonspecific, but concerning for infection given leukocytosis, especially if there is no history of trauma. No drainable fluid collection. 2. Trace right hip joint effusion without convincing evidence of septic arthritis. No osteomyelitis. 3. Partial tear of the right gluteus medius tendon. Electronically Signed   By: WTitus DubinM.D.   On: 07/06/2021 12:39   UKoreaVenous Img Lower Unilateral Left (DVT)  Result Date: 07/07/2021 CLINICAL DATA:  History of prior DVT, pain and swelling left lower leg EXAM: LEFT LOWER EXTREMITY VENOUS DOPPLER ULTRASOUND TECHNIQUE: Gray-scale sonography with graded compression, as well as color Doppler and duplex ultrasound were performed to evaluate the lower extremity deep venous systems from the level of the common femoral vein and including the common femoral, femoral, profunda femoral, popliteal and calf veins including the posterior tibial, peroneal and gastrocnemius veins when visible. The superficial great saphenous vein was also interrogated. Spectral Doppler was utilized to evaluate flow at rest and with distal augmentation maneuvers in the common femoral, femoral and popliteal veins. COMPARISON:  None. FINDINGS: Contralateral Common Femoral Vein: Respiratory phasicity is normal and symmetric with the symptomatic side. No evidence of thrombus. Normal compressibility. Common Femoral Vein: No evidence of thrombus. Normal compressibility, respiratory phasicity and response to augmentation. Saphenofemoral Junction: No evidence of thrombus. Normal compressibility and flow on color Doppler imaging. Profunda  Femoral Vein: No evidence of thrombus. Normal compressibility and flow on color Doppler imaging. Femoral Vein: No evidence of thrombus. Normal compressibility, respiratory phasicity and response to augmentation. Popliteal Vein: No evidence of thrombus. Normal compressibility, respiratory phasicity and response to augmentation. Calf Veins: No evidence of thrombus. Normal compressibility and flow on color Doppler imaging. IMPRESSION: No evidence of deep venous thrombosis. Electronically Signed   By: MJerilynn Mages  Shick M.D.   On: 07/07/2021 11:38   [place]Venous Img Lower Unilateral Right  Result Date: 07/06/2021 CLINICAL DATA:  Right lower extremity pain 3-4 weeks, history of positive DVT in 2016 EXAM: RIGHT LOWER EXTREMITY VENOUS DOPPLER ULTRASOUND TECHNIQUE: Gray-scale sonography with graded compression, as well as color Doppler and duplex ultrasound were performed to evaluate the lower extremity deep venous systems from the level of the common femoral vein and including the common femoral, femoral, profunda femoral, popliteal and calf veins including the posterior tibial, peroneal and gastrocnemius veins when visible. The superficial great saphenous vein was also interrogated. Spectral Doppler was utilized to evaluate flow at rest and with distal augmentation maneuvers in the common femoral, femoral and popliteal veins. COMPARISON:  March 2016 FINDINGS: Contralateral Common Femoral Vein: Respiratory phasicity is normal and symmetric with  the symptomatic side. No evidence of thrombus. Normal compressibility. Common Femoral Vein: No evidence of thrombus. Normal compressibility, respiratory phasicity and response to augmentation. Saphenofemoral Junction: No evidence of thrombus. Normal compressibility and flow on color Doppler imaging. Profunda Femoral Vein: No evidence of thrombus. Normal compressibility and flow on color Doppler imaging. Femoral Vein: No evidence of thrombus. Normal compressibility, respiratory phasicity and  response to augmentation. Popliteal Vein: No evidence of thrombus. Normal compressibility, respiratory phasicity and response to augmentation. Calf Veins: No evidence of thrombus. Normal compressibility and flow on color Doppler imaging. Other Findings:  None. IMPRESSION: No findings of acute or chronic right lower extremity deep venous thrombosis. Electronically Signed   By: Albin Felling MD   On: 07/06/2021 08:46   DG Chest Port 1 View  Result Date: 07/06/2021 CLINICAL DATA:  Right groin pain. EXAM: PORTABLE CHEST 1 VIEW COMPARISON:  CT chest and chest x-ray dated February 19, 2012. FINDINGS: The heart size and mediastinal contours are within normal limits. Low lung volumes are present, causing crowding of the pulmonary vasculature. Mild bibasilar atelectasis. No focal consolidation, pleural effusion, or pneumothorax. No acute osseous abnormality. IMPRESSION: 1. No active disease. Electronically Signed   By: Titus Dubin M.D.   On: 07/06/2021 14:54   CT BONE MARROW BIOPSY & ASPIRATION  Result Date: 07/07/2021 INDICATION: 85 year old with monocytosis and peripheral blood smear is concerning for myeloid neoplasm. EXAM: CT GUIDED BONE MARROW ASPIRATES AND BIOPSY Physician: Stephan Minister. Anselm Pancoast, MD MEDICATIONS: None. ANESTHESIA/SEDATION: Fentanyl 50 mcg IV; Versed 1.0 mg IV Moderate Sedation Time:  12 minutes The patient was continuously monitored during the procedure by the interventional radiology nurse under my direct supervision. COMPLICATIONS: None immediate. PROCEDURE: The procedure was explained to the patient. The risks and benefits of the procedure were discussed and the patient's questions were addressed. Informed consent was obtained from the patient. The patient was placed prone on CT table. Images of the pelvis were obtained. The right side of back was prepped and draped in sterile fashion. The skin and right posterior ilium were anesthetized with 1% lidocaine. 11 gauge bone needle was directed into the  right ilium with CT guidance. Two aspirates and one core biopsy were obtained. Bandage placed over the puncture site. FINDINGS: Biopsy needle directed in the posterior right ilium. IMPRESSION: CT guided bone marrow aspiration and core biopsy. Electronically Signed   By: Markus Daft M.D.   On: 07/07/2021 11:01   ECHOCARDIOGRAM COMPLETE  Result Date: 07/10/2021    ECHOCARDIOGRAM REPORT   Patient Name:   ARBY DAHIR Date of Exam: 07/10/2021 Medical Rec #:  [id number]   Height:       67.0 in Accession #:    [id number]  Weight:       201.7 lb Date of Birth:  08/29/1936   BSA:          2.030 m Patient Age:    64 years    BP:           100/51 mmHg Patient Gender: M           HR:           79 bpm. Exam Location:  [facility] Procedure: 2D Echo and Intracardiac Opacification Agent Indications:     Fever  History:         Patient has no prior history of Echocardiogram examinations.                  Risk Factors:Dyslipidemia.  Sonographer:  L Thornton-Maynard Referring Phys:  [id number] Tsosie Billing Diagnosing Phys: Yolonda Kida MD IMPRESSIONS  1. Left ventricular ejection fraction, by estimation, is 60 to 65%. The left ventricle has normal function. The left ventricle has no regional wall motion abnormalities. Left ventricular diastolic parameters were normal.  2. Right ventricular systolic function is normal. The right ventricular size is mildly enlarged. There is moderately elevated pulmonary artery systolic pressure.  3. Left atrial size was mildly dilated.  4. Right atrial size was mildly dilated.  5. The mitral valve is normal in structure. No evidence of mitral valve regurgitation.  6. The aortic valve is normal in structure. Aortic valve regurgitation is not visualized. Mild aortic valve stenosis. FINDINGS  Left Ventricle: Left ventricular ejection fraction, by estimation, is 60 to 65%. The left ventricle has normal function. The left ventricle has no regional wall motion abnormalities. Definity contrast agent  was given IV to delineate the left ventricular  endocardial borders. The left ventricular internal cavity size was normal in size. There is no left ventricular hypertrophy. Left ventricular diastolic parameters were normal. Right Ventricle: The right ventricular size is mildly enlarged. No increase in right ventricular wall thickness. Right ventricular systolic function is normal. There is moderately elevated pulmonary artery systolic pressure. The tricuspid regurgitant velocity is 2.88 m/s, and with an assumed right atrial pressure of 15 mmHg, the estimated right ventricular systolic pressure is 50.0 mmHg. Left Atrium: Left atrial size was mildly dilated. Right Atrium: Right atrial size was mildly dilated. Pericardium: There is no evidence of pericardial effusion. Mitral Valve: The mitral valve is normal in structure. No evidence of mitral valve regurgitation. Tricuspid Valve: The tricuspid valve is grossly normal. Tricuspid valve regurgitation is trivial. Aortic Valve: The aortic valve is normal in structure. Aortic valve regurgitation is not visualized. Mild aortic stenosis is present. Aortic valve mean gradient measures 12.0 mmHg. Aortic valve peak gradient measures 20.4 mmHg. Aortic valve area, by VTI measures 1.80 cm. Pulmonic Valve: The pulmonic valve was normal in structure. Pulmonic valve regurgitation is not visualized. Aorta: The ascending aorta was not well visualized. IAS/Shunts: No atrial level shunt detected by color flow Doppler.  LEFT VENTRICLE PLAX 2D LVIDd:         5.09 cm  Diastology LVIDs:         3.34 cm  LV e' medial:    8.38 cm/s LV PW:         0.89 cm  LV E/e' medial:  9.0 LV IVS:        0.98 cm  LV e' lateral:   6.42 cm/s LVOT diam:     2.20 cm  LV E/e' lateral: 11.7 LV SV:         70 LV SV Index:   34 LVOT Area:     3.80 cm  RIGHT VENTRICLE RV S prime:     11.00 cm/s TAPSE (M-mode): 1.8 cm LEFT ATRIUM              Index       RIGHT ATRIUM           Index LA diam:        4.80 cm  2.36  cm/m  RA Area:     20.50 cm LA Vol (A2C):   103.0 ml 50.75 ml/m RA Volume:   55.70 ml  27.44 ml/m LA Vol (A4C):   64.1 ml  31.58 ml/m LA Biplane Vol: 84.8 ml  41.78 ml/m  AORTIC VALVE  PULMONIC VALVE AV Area (Vmax):    1.92 cm     PV Vmax:       1.19 m/s AV Area (Vmean):   1.78 cm     PV Peak grad:  5.7 mmHg AV Area (VTI):     1.80 cm AV Vmax:           226.00 cm/s AV Vmean:          170.000 cm/s AV VTI:            0.389 m AV Peak Grad:      20.4 mmHg AV Mean Grad:      12.0 mmHg LVOT Vmax:         114.00 cm/s LVOT Vmean:        79.800 cm/s LVOT VTI:          0.184 m LVOT/AV VTI ratio: 0.47  AORTA Ao Root diam: 3.70 cm MV E velocity: 75.40 cm/s  TRICUSPID VALVE MV A velocity: 75.40 cm/s  TR Peak grad:   33.2 mmHg MV E/A ratio:  1.00        TR Vmax:        288.00 cm/s                             SHUNTS                            Systemic VTI:  0.18 m                            Systemic Diam: 2.20 cm Yolonda Kida MD Electronically signed by Yolonda Kida MD Signature Date/Time: 07/10/2021/8:56:17 PM    Final    DG Hip Unilat W or Wo Pelvis 2-3 Views Right  Result Date: 07/06/2021 CLINICAL DATA:  Right hip and groin pain EXAM: DG HIP (WITH OR WITHOUT PELVIS) 2-3V RIGHT COMPARISON:  None. FINDINGS: There is no evidence of hip fracture or dislocation. Mild hip joint space narrowing. Partially visualized lower lumbar spondylosis. IMPRESSION: Mild degenerative changes of the right hip.  No acute findings. Electronically Signed   By: Davina Poke D.O.   On: 07/06/2021 08:46    Assessment and plan- Patient is a 85 y.o. male with leukocytosis/monocytosis currently awaiting pet scan for possible CMML who [facility] with fevers and shortness of breath.   Fever- discussed with ID and Dr. Janese Banks. Plan for additional labs today: Fungitell (Beta D Glucan), Fungal antibodies serum, febrile antibody profile ([facility] test number [id number]), bartonella antibody profile  (labcorp test number [id number]), quantiferon gold, ehrlichiosis ([facility] test number [id number]), procalcitonin, blood cultures, francisella tularensis antibodies, aspergillus galactomannin antigen serum, histoplasma urine antigen (lab[facility] test number [id number]), tulerensis antibody IgG/IgM Scheduled tylenol to control fever, not to exceed 4 grams in 24 hours.  Hypoxia- etiology unclear. Previous chest xray was negative for revealing etiology or mass effect. Qualifies for home oxygen. Start 4L to maintain spo2 > 90%.   PET Scheduled for 8/18. Plan to follow up with Dr. Janese Banks on Friday for results.   Visit Diagnosis 1. Fever, unspecified fever cause   2. Hypoxia    Patient expressed understanding and was in agreement with this plan. He also understands that He can call clinic at any time with any questions, concerns, or complaints.   Thank you for allowing me to  participate in the care of this very pleasant patient.   Beckey Rutter, DNP, AGNP-C [place] [facility] [place]

## 2021-07-12 NOTE — Telephone Encounter (Signed)
Patient's daughter called to report that since discharge he is continuing to be febrile temps ranging from 45. To 103. She has been giving tylenol every six hours but last night the fever did not respond to tylenol and she did give Advil which is contraindicated with Eliquis. She would like advice on how to manage temp and is it ok to occasionally use Advil. His O2 saturation has been ranging from 80% with activity to 86%-91% at rest.  He has a productive cough the sputum is thin and clear. She would also like to know when the PET that Dr. Janese Banks ordered will be scheduled.

## 2021-07-12 NOTE — Progress Notes (Signed)
SATURATION QUALIFICATIONS: (This note is used to comply with regulatory documentation for home oxygen)  Patient Saturations on Room Air at Rest = 86%  Patient Saturations on Room Air while Ambulating = 95%  Patient Saturations on 3 Liters of oxygen while Ambulating = 95%  Please briefly explain why patient needs home oxygen: hypoxic at rest. Oxygen will need to be continuous in order maintain adequate oxygenation.

## 2021-07-12 NOTE — Telephone Encounter (Signed)
Pt was added for [facility] , Lauren saw the patient

## 2021-07-12 NOTE — Progress Notes (Signed)
Patient states they are concerned about swelling in his feet, SpO2 levels been low. Patient has a fever.

## 2021-07-13 ENCOUNTER — Encounter: Payer: Self-pay | Admitting: Nurse Practitioner

## 2021-07-13 ENCOUNTER — Telehealth (INDEPENDENT_AMBULATORY_CARE_PROVIDER_SITE_OTHER): Payer: Medicare Other | Admitting: Nurse Practitioner

## 2021-07-13 ENCOUNTER — Telehealth: Payer: Self-pay | Admitting: Oncology

## 2021-07-13 ENCOUNTER — Other Ambulatory Visit: Payer: Self-pay

## 2021-07-13 DIAGNOSIS — D4989 Neoplasm of unspecified behavior of other specified sites: Secondary | ICD-10-CM

## 2021-07-13 DIAGNOSIS — M25551 Pain in right hip: Secondary | ICD-10-CM

## 2021-07-13 DIAGNOSIS — R0902 Hypoxemia: Secondary | ICD-10-CM

## 2021-07-13 DIAGNOSIS — D72821 Monocytosis (symptomatic): Secondary | ICD-10-CM | POA: Diagnosis not present

## 2021-07-13 DIAGNOSIS — I10 Essential (primary) hypertension: Secondary | ICD-10-CM

## 2021-07-13 DIAGNOSIS — R509 Fever, unspecified: Secondary | ICD-10-CM

## 2021-07-13 DIAGNOSIS — Z86718 Personal history of other venous thrombosis and embolism: Secondary | ICD-10-CM

## 2021-07-13 DIAGNOSIS — H1032 Unspecified acute conjunctivitis, left eye: Secondary | ICD-10-CM

## 2021-07-13 LAB — MULTIPLE MYELOMA PANEL, SERUM
Albumin SerPl Elph-Mcnc: 2.2 g/dL — ABNORMAL LOW (ref 2.9–4.4)
Albumin/Glob SerPl: 0.5 — ABNORMAL LOW (ref 0.7–1.7)
Alpha 1: 0.5 g/dL — ABNORMAL HIGH (ref 0.0–0.4)
Alpha2 Glob SerPl Elph-Mcnc: 1 g/dL (ref 0.4–1.0)
B-Globulin SerPl Elph-Mcnc: 0.7 g/dL (ref 0.7–1.3)
Gamma Glob SerPl Elph-Mcnc: 2.5 g/dL — ABNORMAL HIGH (ref 0.4–1.8)
Globulin, Total: 4.7 g/dL — ABNORMAL HIGH (ref 2.2–3.9)
IgA: 66 mg/dL (ref 61–437)
IgG (Immunoglobin G), Serum: 3545 mg/dL — ABNORMAL HIGH (ref 603–1613)
IgM (Immunoglobulin M), Srm: 15 mg/dL (ref 15–143)
M Protein SerPl Elph-Mcnc: 2.3 g/dL — ABNORMAL HIGH
Total Protein ELP: 6.9 g/dL (ref 6.0–8.5)

## 2021-07-13 LAB — UPEP/TP, 24-HR URINE
Albumin, U: 15.1 %
Alpha 1, Urine: 2.2 %
Alpha 2, Urine: 14.4 %
Beta, Urine: 50 %
Gamma Globulin, Urine: 18.3 %
M-Spike, mg/24 hr: 348.5 mg/24 hr — ABNORMAL HIGH
M-spike, %: 45.4 % — ABNORMAL HIGH
Total Protein, Urine-Ur/day: 768 mg/24 hr — ABNORMAL HIGH (ref 30–150)
Total Protein, Urine: 85.3 mg/dL
Total Volume: 900

## 2021-07-13 LAB — BARTONELLA ANTIBODY PANEL
B Quintana IgM: NEGATIVE titer
B henselae IgG: NEGATIVE titer
B henselae IgM: NEGATIVE titer
B quintana IgG: NEGATIVE titer

## 2021-07-13 LAB — MISC LABCORP TEST (SEND OUT): Labcorp test code: 164630

## 2021-07-13 MED ORDER — ALPRAZOLAM 0.5 MG PO TABS: 0.5000 mg | ORAL_TABLET | Freq: Two times a day (BID) | ORAL | 0 refills | Status: AC | PRN

## 2021-07-13 MED ORDER — ALPRAZOLAM 0.5 MG PO TABS: 0.2500 mg | ORAL_TABLET | Freq: Two times a day (BID) | ORAL | 0 refills | Status: DC | PRN

## 2021-07-13 NOTE — Progress Notes (Signed)
This service is provided via telemedicine  No vital signs collected/recorded due to the encounter was a telemedicine visit.   Location of patient (ex: home, work):  Home  Patient consents to a telephone visit:  Yes, see encounter dated 01/07/2021  Location of the provider (ex: office, home):  [place]   Name of any referring provider:  N/A  Names of all persons participating in the telemedicine service and their role in the encounter:  Sherrie Mustache, Nurse Practitioner, Carroll Kinds, CMA, patient and daughter, Almyra Free  Time spent on call:  12 minutes with medical assistant

## 2021-07-13 NOTE — Progress Notes (Signed)
Careteam: Patient Care Team: [name], NP as PCP - General (Geriatric Medicine)  Advanced Directive information Does Patient Have a Medical Advance Directive?: Yes, Type of Advance Directive: [place];Living will;Out of facility DNR (pink MOST or yellow form), Does patient want to make changes to medical advance directive?: No - Patient declined  No Known Allergies  Chief Complaint  Patient presents with   Transitions Of Care    Patient was hospitalized from 07/06/2021 and discharged 07/10/2021. Patient is with daughter, Almyra Free. Patient states that oxygen level is staying around 84%-88% on oxygen. When not on oxygen 79%. Patient has no energy and breathing is a big problem. Patient has no desire to eat because of taste in mouth. Patient complains of chest tightness and chest pain.     HPI: Patient is a 85 y.o. male seen in today for hospital follow up.  Needs portable O2, getting this through oncology.  Has O2 through [facility].   Reports no peep. No appetite.  Reports he had so much blood taken yesterday.  Labs went off to different agencies to result.   CMML and multiple myloma. Pet scan on Thursday to find out more information.  And then appt next day with oncologist.   O2 levels are dropping. Set Oxygen to 3L - 79%  he is short of breath- same since he left the hospital.  Coughing up thick redness sputum.   Keeping tylenol on to keep fevers down.   Blood pressure medication was stopped. 110/55 HR 75-80 Temp of 100.4. has taken tylenol   Eye is better.  Review of Systems:  Review of Systems  Constitutional:  Positive for chills, fever, malaise/fatigue and weight loss.  HENT:  Negative for tinnitus.   Respiratory:  Positive for cough, hemoptysis, sputum production and shortness of breath.   Cardiovascular:  Positive for leg swelling. Negative for chest pain and palpitations.  Gastrointestinal:  Negative for abdominal pain, constipation,  diarrhea and heartburn.  Genitourinary:  Negative for dysuria, frequency and urgency.  Musculoskeletal:  Negative for back pain, falls, joint pain and myalgias.  Skin: Negative.   Neurological:  Negative for dizziness and headaches.  Psychiatric/Behavioral:  Negative for memory loss.    Past Medical History:  Diagnosis Date   CMML (chronic myelomonocytic leukemia) ([place])    History of colon polyps    Hypercholesteremia    Per [facility] New Patient Packet.   Hyperlipidemia    Hypertension    Per [facility] New Patient Packet.   Monocytosis    Multiple myeloma ([place])    Post-nasal drip    Per [facility] New Patient Packet.   Squamous cell carcinoma of skin 12/21/2020   Right mid helix, [facility]   Squamous cell carcinoma of skin 12/21/2020   in situ, Central lower lip, needs appt for LN2 followed by 5FU   Squamous cell carcinoma of skin 02/10/2021   right dorsum hand, [facility]    Past Surgical History:  Procedure Laterality Date   COLONOSCOPY  11/28/2016   [facility], Per [facility] New Patient Packet.   EAR BIOPSY     and lower lip   HERNIA REPAIR     umbilical and inguinal (unsure what side) hernia   NOSE SURGERY     [place], Per [facility] Patient Packet.   TONSILLECTOMY     Per [facility] New Patient Packet.   Social History:   reports that he has never smoked. He has never used smokeless tobacco. He reports current  alcohol use of about 1.0 standard drink per week. He reports that he does not use drugs.  Family History  Problem Relation Age of Onset   Hypertension Mother    Dementia Father    Diabetes Daughter     Medications: Patient's Medications  New Prescriptions   No medications on file  Previous Medications   ACETAMINOPHEN (TYLENOL) 500 MG TABLET    Take 500-1,000 mg by mouth daily.   ERYTHROMYCIN OPHTHALMIC OINTMENT    Place 1 application into both eyes 3 (three) times daily. Instill ~1 cm ribbon into both eyes three times daily for 1 week   FLUTICASONE (FLONASE) 50 MCG/ACT NASAL SPRAY    Place 1 spray  into both nostrils daily as needed for allergies or rhinitis.   IRON, FERROUS SULFATE, 325 (65 FE) MG TABS    Take 325 mg by mouth daily.   PANTOPRAZOLE (PROTONIX) 40 MG TABLET    TAKE 1 TABLET(40 MG) BY MOUTH DAILY   SIMVASTATIN (ZOCOR) 20 MG TABLET    TAKE 1 TABLET(20 MG) BY MOUTH AT BEDTIME   XARELTO 20 MG TABS TABLET    TAKE 1 TABLET(20 MG) BY MOUTH AT BEDTIME  Modified Medications   No medications on file  Discontinued Medications   SILDENAFIL (VIAGRA) 100 MG TABLET    Take 1 tablet (100 mg total) by mouth daily as needed for erectile dysfunction.   UNDECYLENIC AC-ZN UNDECYLENATE (FUNGI-NAIL TOE & FOOT EX)    Apply 1 application topically as needed (toenail fungus).    Physical Exam:  There were no vitals filed for this visit. There is no height or weight on file to calculate BMI. Wt Readings from Last 3 Encounters:  07/12/21 200 lb 11.2 oz (91 kg)  07/07/21 201 lb 11.5 oz (91.5 kg)  06/22/21 200 lb (90.7 kg)      Labs reviewed: Basic Metabolic Panel: Recent Labs    07/06/21 1243 07/07/21 0300 07/08/21 0445 07/09/21 0502 07/10/21 0456 07/12/21 1026  NA  --  133*   < > 137 135 135  K  --  3.9   < > 3.9 3.5 3.4*  CL  --  104   < > 104 102 100  CO2  --  25   < > 28 26 24   GLUCOSE  --  138*   < > 129* 116* 118*  BUN  --  26*   < > 36* 38* 39*  CREATININE  --  1.58*   < > 1.35* 1.26* 1.39*  CALCIUM  --  7.5*   < > 8.4* 7.9* 8.0*  MG  --  1.9  --   --  2.2  --   PHOS 2.8  --   --   --   --   --   TSH 4.744*  --   --   --   --   --    < > = values in this interval not displayed.   Liver Function Tests: Recent Labs    07/06/21 0717 07/07/21 0300 07/12/21 1026  AST 16 14* 40  ALT 12 10 47*  ALKPHOS 52 44 78  BILITOT 1.0 0.9 1.1  PROT 8.8* 7.7 9.0*  ALBUMIN 2.7* 2.3* 2.4*   No results for input(s): LIPASE, AMYLASE in the last 8760 hours. No results for input(s): AMMONIA in the last 8760 hours. CBC: Recent Labs    07/09/21 0502 07/10/21 0456 07/12/21 1026   WBC 20.7* 13.8* 18.9*  NEUTROABS 3.7 2.9 4.2  HGB 9.1* 8.3* 9.6*  HCT 27.5* 25.2* 29.4*  MCV 101.1* 98.8 101.4*  PLT 125* 114* 130*   Lipid Panel: Recent Labs    11/16/20 0000  CHOL 135  HDL 37  LDLCALC 81  TRIG 91   TSH: Recent Labs    07/06/21 1243  TSH 4.744*   A1C: No results found for: HGBA1C   Assessment/Plan 1. Monocytosis Found to have CMML and multiple myloma. PET scan and oncology follow up has been scheduled for later this week. Pt having concern over PET scan. Will give Rx for alprazolam for PET scan and to have as needed for increase in anxiety during this time.  - ALPRAZolam (XANAX) 0.5 MG tablet; Take 1 tablet (0.5 mg total) by mouth 2 (two) times daily as needed for anxiety.  Dispense: 30 tablet; Refill: 0  2. Right hip pain Has improved.  3. Essential hypertension Off all medications, blood pressure has been well controlled.   4. History of DVT (deep vein thrombosis) -continues on xarelto- no further thrombosis noted.  5. Acute bacterial conjunctivitis of left eye resolved  6. Hypoxia Ongoing, O2 sats low with cough, congestion and  shortness of breath. Chest xray negative during hospitalization however likely needs repeat xray/ct chest. Reached out to oncology and plans to follow up when pt is there getting PET scan for further imaging.   7. FUO (fever of unknown origin) -ongoing, continues to be worked up at this time. Continues on tylenol every 8 hours for symptom management.    Carlos American. Harle Battiest  [facility] [phone]     Virtual Visit via mychart video  I connected with pt on 07/13/21 at  9:00 AM EDT by video and verified that I am speaking with the correct person using two identifiers.  Location: Patient: home Provider: [facility]   I discussed the limitations, risks, security and privacy concerns of performing an evaluation and management service by telephone and the availability of in  person appointments. I also discussed with the patient that there may be a patient responsible charge related to this service. The patient expressed understanding and agreed to proceed.   I discussed the assessment and treatment plan with the patient. The patient was provided an opportunity to ask questions and all were answered. The patient agreed with the plan and demonstrated an understanding of the instructions.   The patient was advised to call back or seek an in-person evaluation if the symptoms worsen or if the condition fails to improve as anticipated.  I provided 25 minutes of non-face-to-face time during this encounter.  Carlos American. Harle Battiest Avs printed and mailed

## 2021-07-13 NOTE — Telephone Encounter (Signed)
Daughter called to request that 2 family members accompany patient to visit on Friday 8/19 (plan to discuss PET scan results). Ok per MD--appointment notes updated.

## 2021-07-14 ENCOUNTER — Telehealth: Payer: Self-pay

## 2021-07-14 ENCOUNTER — Encounter: Payer: Self-pay | Admitting: Nurse Practitioner

## 2021-07-14 ENCOUNTER — Encounter (HOSPITAL_COMMUNITY): Payer: Self-pay | Admitting: Oncology

## 2021-07-14 LAB — PROTEIN ELECTRO, RANDOM URINE
Albumin ELP, Urine: 15.5 %
Alpha-1-Globulin, U: 2.2 %
Alpha-2-Globulin, U: 12.1 %
Beta Globulin, U: 53.4 %
Gamma Globulin, U: 16.7 %
M Component, Ur: 8.6 % — ABNORMAL HIGH
Total Protein, Urine: 107.4 mg/dL

## 2021-07-14 LAB — FUNGITELL, SERUM

## 2021-07-14 NOTE — Telephone Encounter (Signed)
Late entry:  Incoming call was received about 2 pm from patients daughter Jeral Fruit, stating she has called patients oncologist and our office several times today and no one would answer the phone at either location. Jody states " I'm glad I finally got a human being."   Jody called requesting an order for oxygen to be sent to [facility]. Jody states patients O2 was between 89-90 on 4 liters and the new order needs to indicate ok for patient to have oxygen, greater than 5 liters. Patients current canister would not accommodate the use of 5 liters. Jody states oxygen was initiated by patients oncologist, but they are not replying to her mychart messages or answering the phone.  I placed Jody on a brief hold while I consulted with [name] to confirm that she would be willing to provide the order as request. Jeral Fruit was going to locate the fax and phone number for [employer] while I spoke with [name].   [name] spoke with Jody and instructed her to take her father to the emergency room.  Message forwarded to [name] in the event that she would like to make a more through documentation of the conversation she and Jody had.

## 2021-07-15 ENCOUNTER — Other Ambulatory Visit: Payer: Self-pay

## 2021-07-15 ENCOUNTER — Encounter (HOSPITAL_COMMUNITY)
Admission: RE | Admit: 2021-07-15 | Discharge: 2021-07-15 | Disposition: A | Discharge status: Home / Self Care | Payer: Medicare Other | Source: Ambulatory Visit | Attending: Oncology | Admitting: Oncology

## 2021-07-15 DIAGNOSIS — D4989 Neoplasm of unspecified behavior of other specified sites: Secondary | ICD-10-CM | POA: Insufficient documentation

## 2021-07-15 DIAGNOSIS — I313 Pericardial effusion (noninflammatory): Secondary | ICD-10-CM | POA: Diagnosis not present

## 2021-07-15 DIAGNOSIS — I2510 Atherosclerotic heart disease of native coronary artery without angina pectoris: Secondary | ICD-10-CM | POA: Diagnosis not present

## 2021-07-15 DIAGNOSIS — J90 Pleural effusion, not elsewhere classified: Secondary | ICD-10-CM | POA: Insufficient documentation

## 2021-07-15 LAB — MISC LABCORP TEST (SEND OUT)
Labcorp test code: 164284
Labcorp test code: 164285

## 2021-07-15 LAB — GLUCOSE, CAPILLARY: Glucose-Capillary: 123 mg/dL — ABNORMAL HIGH (ref 70–99)

## 2021-07-15 IMAGING — CT NM PET TUM IMG INITIAL (PI) SKULL BASE T - THIGH
7 series · 25 of 25 positions shown · non-contrast
Comparison: Chest x-ray dated [DATE]

CLINICAL DATA: Initial treatment strategy for myeloid neoplasm.

EXAM:
NUCLEAR MEDICINE PET SKULL BASE TO THIGH
TECHNIQUE: 9.66 mCi F-18 FDG was injected intravenously. Full-ring PET imaging
was performed from the skull base to thigh after the radiotracer. CT
data was obtained and used for attenuation correction and anatomic
localization.
Fasting blood glucose: 123 mg/dl

[Series 3: pet sk_thigh ac · axial · 5.0mm · 4.07mm/px · z∈[-1062,-102]mm · 6 of 241 slices shown]
[im 1/241]
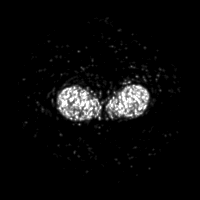
[im 49/241]
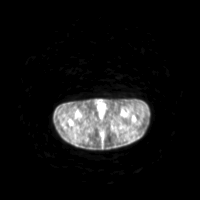
[im 97/241]
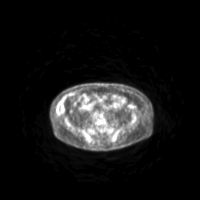
[im 145/241]
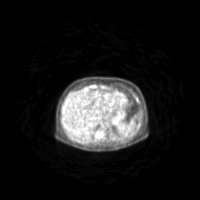
[im 193/241]
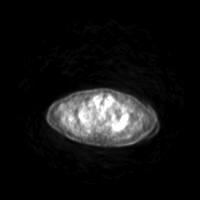
[im 241/241]
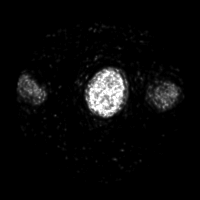

[Series 4: ct sk_thigh 5.0 bf37 · axial · 5.0mm · 0.98mm/px · z∈[-1062,-102]mm · 6 of 241 slices shown]
[im 1/241]
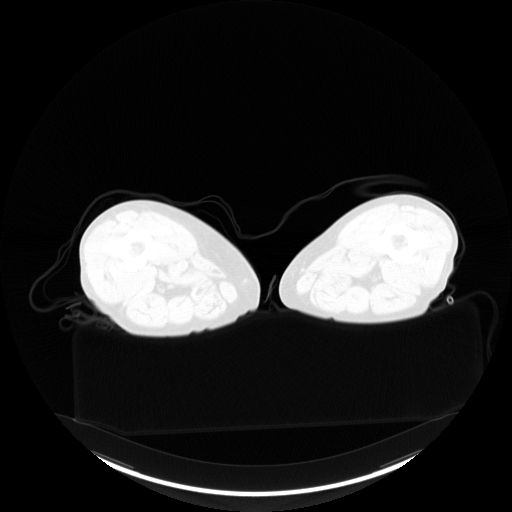
[im 49/241]
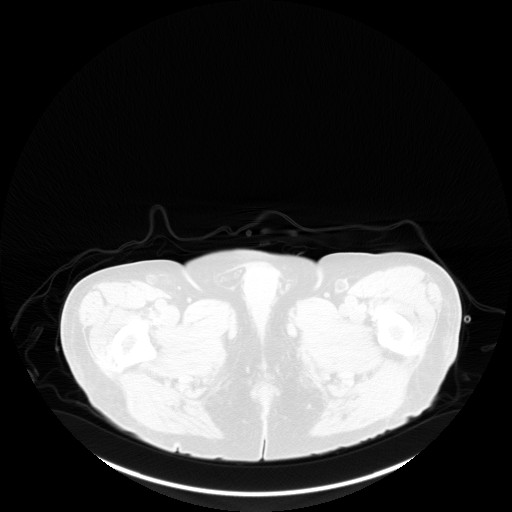
[im 97/241  brain]
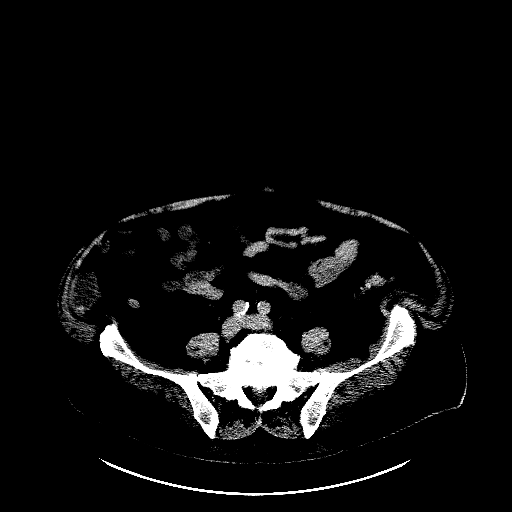
[im 145/241  brain]
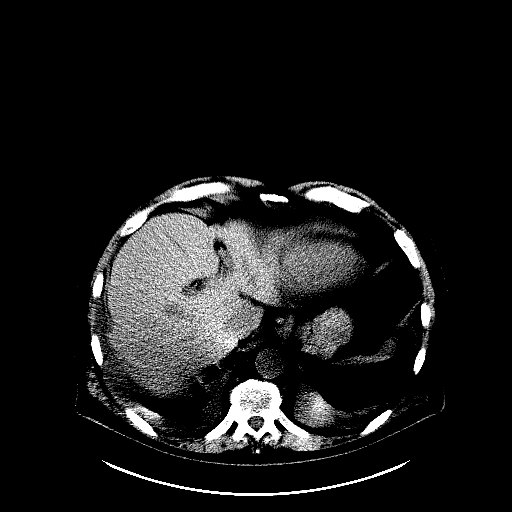
[im 193/241  brain]
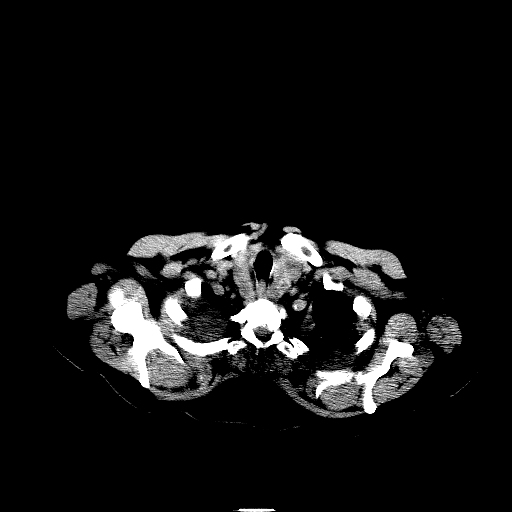
[im 241/241  brain]
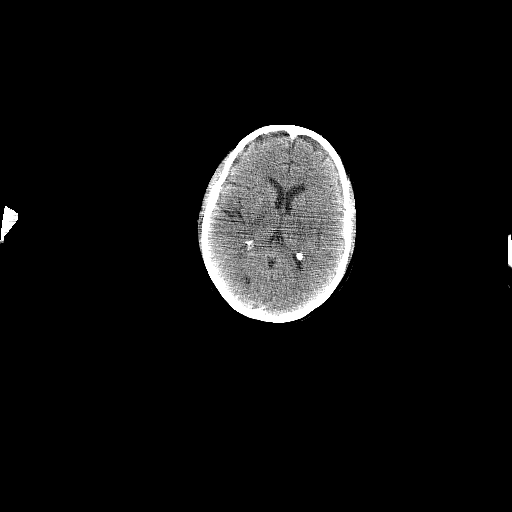

[Series 5: pet sk_thigh nac · axial · 5.0mm · 4.07mm/px · z∈[-1062,-102]mm · 5 of 241 slices shown]
[im 1/241]
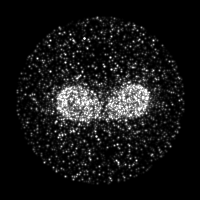
[im 61/241]
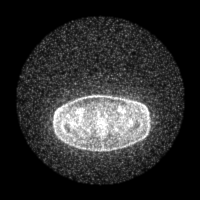
[im 121/241]
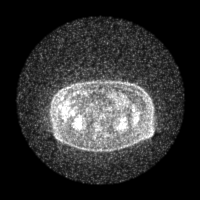
[im 181/241]
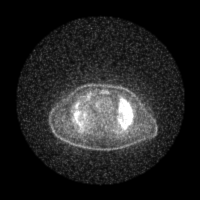
[im 241/241]
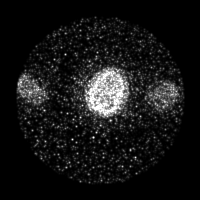

[Series 8: ct sk_thigh 5.0 br59 lung_bone · axial · 5.0mm · 0.85mm/px · 1 of 65 slices shown]
[im 1/65]
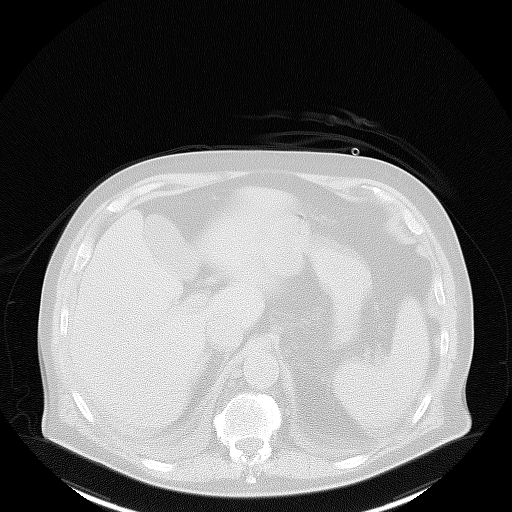

[Series 603: <mip collection> · coronal · 1.99mm/px · 1 of 32 slices shown]
[im 1/32]
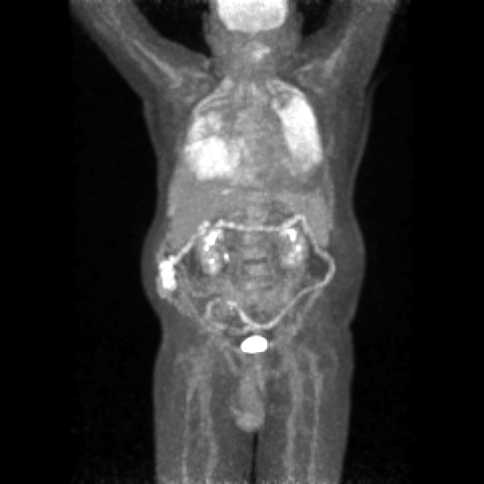

[Series 604: fused cor · 1 of 44 slices shown]
[im 1/44]
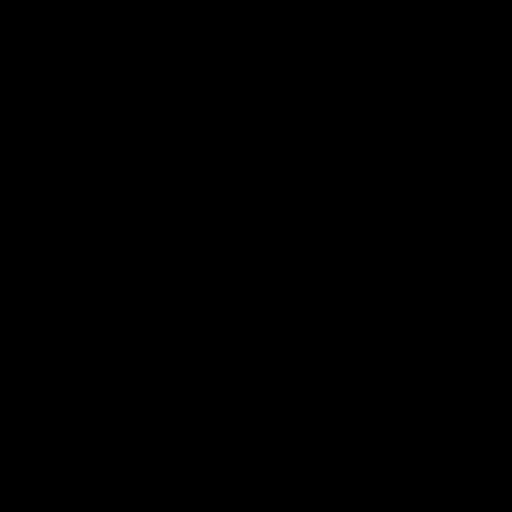

[Series 605: range-ct sk_thigh 5.0 bf37-tra-<alpha range> · 5 of 235 slices shown]
[im 1/235]
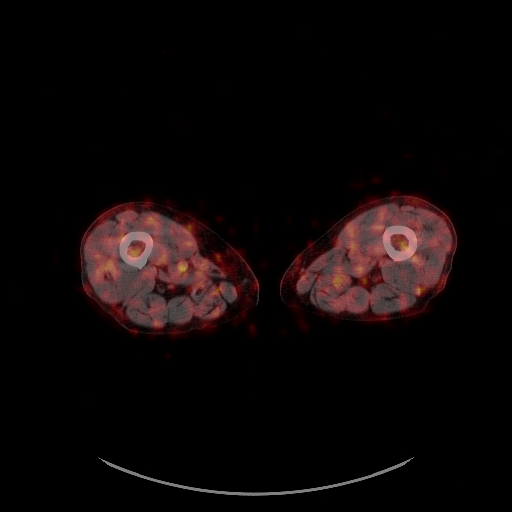
[im 59/235]
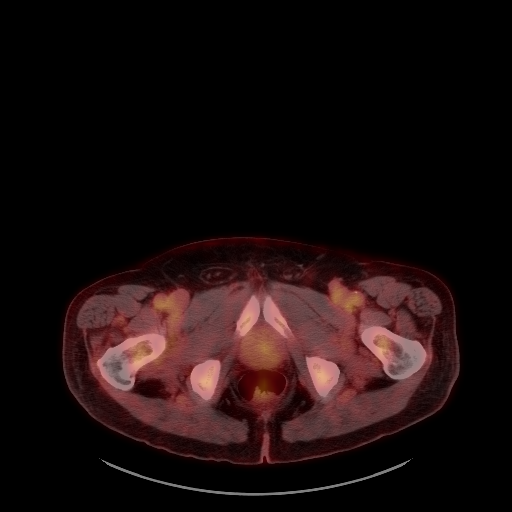
[im 118/235]
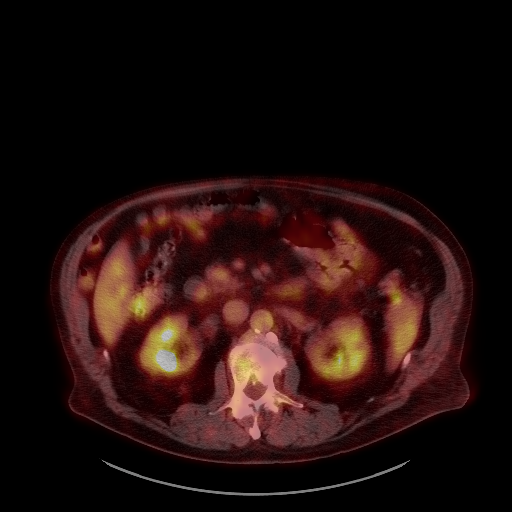
[im 176/235]
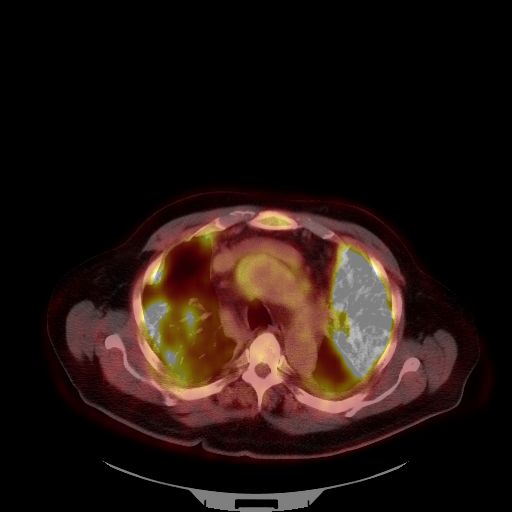
[im 235/235]
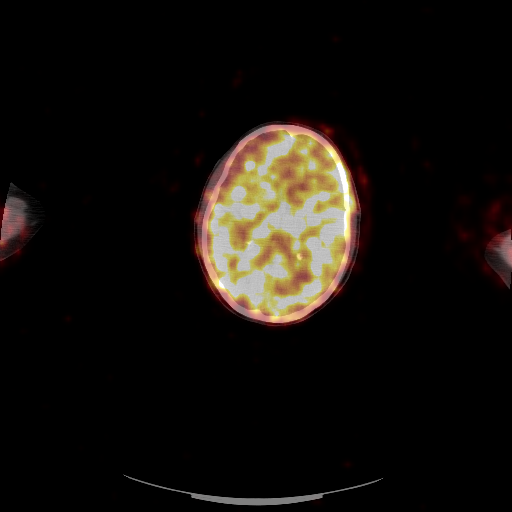

[25 of 25 positions shown; findings below may reference images not displayed]

FINDINGS: Mediastinal blood pool activity: SUV max

Liver activity: SUV max

NECK: No hypermetabolic lymph nodes in the neck. Nonspecific
asymmetric FDG uptake within SUV max of 5.8 which appears to
correlate of the left upper soft palate when accounting for
misregistration artifact.

Incidental CT findings: none

CHEST: FDG avid ground-glass opacities with areas of consolidation
are seen throughout the lungs, more confluent in the left upper and
right lower lobe (SUV max 10.5). Moderate pericardial effusion (SUV
max 2.3) and small bilateral pleural effusions (SUV max 1.8) with
FDG uptake similar to background.

Incidental CT findings: Three-vessel coronary artery calcifications.
Heterogeneous thyroid.

ABDOMEN/PELVIS: No abnormal hypermetabolic activity within the
liver, pancreas, adrenal glands, or spleen. No hypermetabolic lymph
nodes in the abdomen or pelvis.

Incidental CT findings: Numerous colonic diverticula.

SKELETON: No focal hypermetabolic activity to suggest skeletal
metastasis.

Incidental CT findings: none
IMPRESSION: FDG avid heterogeneous opacities are seen throughout the lungs, more
confluent in the left upper and right lower lobe, findings are new
when compared with prior chest x-ray dated [DATE], favor
acute etiology such as multifocal infection, pulmonary edema is less
likely given FDG avidity of lung opacities.

Moderate pericardial effusion and small bilateral pleural effusions
with FDG uptake similar to background.

No enlarged or FDG avid lymph nodes.

Nonspecific asymmetric FDG uptake which appears to correlate with
upper left soft palate when accounting for misregistration artifact.
Correlate with direct clinical inspection and consider further
evaluation with contrast-enhanced neck CT.

These results will be called to the ordering clinician or
representative by the Radiologist Assistant, and communication
documented in the PACS or [REDACTED].

## 2021-07-15 MED ORDER — FLUDEOXYGLUCOSE F - 18 (FDG) INJECTION
10.0000 | Freq: Once | INTRAVENOUS | Status: AC
  Administered 2021-07-15: 9.66 via INTRAVENOUS

## 2021-07-15 NOTE — Telephone Encounter (Signed)
Staff at [facility] got up with [facility] and pt got oxygen

## 2021-07-15 NOTE — Telephone Encounter (Signed)
Advised daughter to take pt to ED due to increase oxygen needs reports sats of 89-90% on 5L. She was not with her dad at this time. Spoke with [place] nurse at [facility] who visited with pt and reports his sats were stable at 4L. He was not having any increase shortness of breath or distressed. He had spoken with oncologist plan to follow up with specialist tomorrow after PET scan.

## 2021-07-16 ENCOUNTER — Inpatient Hospital Stay: Payer: Medicare Other | Admitting: Oncology

## 2021-07-17 LAB — CULTURE, BLOOD (ROUTINE X 2)
Culture: NO GROWTH
Culture: NO GROWTH
Special Requests: ADEQUATE
Special Requests: ADEQUATE

## 2021-07-19 ENCOUNTER — Ambulatory Visit: Payer: PRIVATE HEALTH INSURANCE

## 2021-07-19 ENCOUNTER — Encounter (HOSPITAL_COMMUNITY): Payer: Self-pay | Admitting: Oncology

## 2021-07-19 LAB — MISC LABCORP TEST (SEND OUT): Labcorp test code: 823263

## 2021-07-20 LAB — INTELLIGEN MYELOID

## 2021-07-22 LAB — QUANTIFERON-TB GOLD PLUS (RQFGPL)
QuantiFERON Mitogen Value: >10 IU/mL
QuantiFERON Nil Value: 1.28 IU/mL
QuantiFERON TB1 Ag Value: 0.65 IU/mL
QuantiFERON TB2 Ag Value: 0.96 IU/mL

## 2021-07-22 LAB — SURGICAL PATHOLOGY

## 2021-07-22 LAB — QUANTIFERON-TB GOLD PLUS: QuantiFERON-TB Gold Plus: NEGATIVE

## 2021-07-23 LAB — MISC LABCORP TEST (SEND OUT): Labcorp test code: 164722

## 2021-07-29 DEATH — deceased

## 2021-09-23 ENCOUNTER — Ambulatory Visit: Payer: Self-pay | Admitting: Nurse Practitioner

## 2021-10-14 ENCOUNTER — Ambulatory Visit: Payer: Medicare Other | Admitting: Nurse Practitioner

## 2022-01-25 ENCOUNTER — Encounter: Payer: Medicare Other | Admitting: Nurse Practitioner

## 2022-11-04 LAB — HISTOPLASMA ANTIGEN, URINE: Histoplasma Antigen, urine: <0.5 (ref <0.5)
